# Patient Record
Sex: Male | Born: 1955 | Race: White | Hispanic: No | Marital: Married | Smoking: Never smoker
Health system: Southern US, Community
[De-identification: ages and names within clinical notes are randomized; demographics above are authoritative.]

## PROBLEM LIST (undated history)

## (undated) DIAGNOSIS — I491 Atrial premature depolarization: Secondary | ICD-10-CM

## (undated) DIAGNOSIS — D751 Secondary polycythemia: Secondary | ICD-10-CM

## (undated) DIAGNOSIS — K635 Polyp of colon: Secondary | ICD-10-CM

## (undated) DIAGNOSIS — G709 Myoneural disorder, unspecified: Secondary | ICD-10-CM

## (undated) DIAGNOSIS — C060 Malignant neoplasm of cheek mucosa: Secondary | ICD-10-CM

## (undated) DIAGNOSIS — L719 Rosacea, unspecified: Secondary | ICD-10-CM

## (undated) DIAGNOSIS — R030 Elevated blood-pressure reading, without diagnosis of hypertension: Secondary | ICD-10-CM

## (undated) DIAGNOSIS — R011 Cardiac murmur, unspecified: Secondary | ICD-10-CM

## (undated) DIAGNOSIS — E66811 Obesity, class 1: Secondary | ICD-10-CM

## (undated) DIAGNOSIS — E119 Type 2 diabetes mellitus without complications: Secondary | ICD-10-CM

## (undated) DIAGNOSIS — N400 Enlarged prostate without lower urinary tract symptoms: Secondary | ICD-10-CM

## (undated) DIAGNOSIS — J302 Other seasonal allergic rhinitis: Secondary | ICD-10-CM

## (undated) DIAGNOSIS — K219 Gastro-esophageal reflux disease without esophagitis: Secondary | ICD-10-CM

## (undated) DIAGNOSIS — M109 Gout, unspecified: Secondary | ICD-10-CM

## (undated) DIAGNOSIS — G4733 Obstructive sleep apnea (adult) (pediatric): Secondary | ICD-10-CM

## (undated) DIAGNOSIS — E785 Hyperlipidemia, unspecified: Secondary | ICD-10-CM

## (undated) DIAGNOSIS — F988 Other specified behavioral and emotional disorders with onset usually occurring in childhood and adolescence: Secondary | ICD-10-CM

## (undated) DIAGNOSIS — R7303 Prediabetes: Secondary | ICD-10-CM

## (undated) DIAGNOSIS — N200 Calculus of kidney: Secondary | ICD-10-CM

## (undated) DIAGNOSIS — G4730 Sleep apnea, unspecified: Secondary | ICD-10-CM

## (undated) DIAGNOSIS — Z8701 Personal history of pneumonia (recurrent): Secondary | ICD-10-CM

## (undated) DIAGNOSIS — Z87442 Personal history of urinary calculi: Secondary | ICD-10-CM

## (undated) DIAGNOSIS — T7840XA Allergy, unspecified, initial encounter: Secondary | ICD-10-CM

## (undated) DIAGNOSIS — N529 Male erectile dysfunction, unspecified: Secondary | ICD-10-CM

## (undated) DIAGNOSIS — E669 Obesity, unspecified: Secondary | ICD-10-CM

## (undated) DIAGNOSIS — N486 Induration penis plastica: Secondary | ICD-10-CM

## (undated) HISTORY — DX: Rosacea, unspecified: L71.9

## (undated) HISTORY — DX: Elevated blood-pressure reading, without diagnosis of hypertension: R03.0

## (undated) HISTORY — DX: Obstructive sleep apnea (adult) (pediatric): G47.33

## (undated) HISTORY — DX: Prediabetes: R73.03

## (undated) HISTORY — PX: TONSILLECTOMY: SUR1361

## (undated) HISTORY — PX: OTHER SURGICAL HISTORY: SHX169

## (undated) HISTORY — DX: Calculus of kidney: N20.0

## (undated) HISTORY — DX: Secondary polycythemia: D75.1

## (undated) HISTORY — DX: Sleep apnea, unspecified: G47.30

## (undated) HISTORY — DX: Personal history of urinary calculi: Z87.442

## (undated) HISTORY — DX: Polyp of colon: K63.5

## (undated) HISTORY — DX: Allergy, unspecified, initial encounter: T78.40XA

## (undated) HISTORY — PX: BASAL CELL CARCINOMA EXCISION: SHX1214

## (undated) HISTORY — DX: Personal history of pneumonia (recurrent): Z87.01

## (undated) HISTORY — DX: Gastro-esophageal reflux disease without esophagitis: K21.9

## (undated) HISTORY — PX: COSMETIC SURGERY: SHX468

## (undated) HISTORY — DX: Malignant neoplasm of cheek mucosa: C06.0

## (undated) HISTORY — DX: Other seasonal allergic rhinitis: J30.2

## (undated) HISTORY — DX: Obesity, unspecified: E66.9

## (undated) HISTORY — DX: Obesity, class 1: E66.811

## (undated) HISTORY — DX: Induration penis plastica: N48.6

## (undated) HISTORY — PX: COLON SURGERY: SHX602

## (undated) HISTORY — DX: Hyperlipidemia, unspecified: E78.5

## (undated) HISTORY — DX: Benign prostatic hyperplasia without lower urinary tract symptoms: N40.0

## (undated) HISTORY — DX: Male erectile dysfunction, unspecified: N52.9

## (undated) HISTORY — DX: Cardiac murmur, unspecified: R01.1

## (undated) HISTORY — DX: Atrial premature depolarization: I49.1

## (undated) HISTORY — DX: Other specified behavioral and emotional disorders with onset usually occurring in childhood and adolescence: F98.8

## (undated) HISTORY — DX: Myoneural disorder, unspecified: G70.9

---

## 2007-01-15 DIAGNOSIS — Z8701 Personal history of pneumonia (recurrent): Secondary | ICD-10-CM

## 2007-01-15 HISTORY — PX: COLONOSCOPY: SHX174

## 2007-01-15 HISTORY — DX: Personal history of pneumonia (recurrent): Z87.01

## 2008-01-15 DIAGNOSIS — G4733 Obstructive sleep apnea (adult) (pediatric): Secondary | ICD-10-CM

## 2008-01-15 HISTORY — DX: Obstructive sleep apnea (adult) (pediatric): G47.33

## 2009-01-14 DIAGNOSIS — I491 Atrial premature depolarization: Secondary | ICD-10-CM

## 2009-01-14 DIAGNOSIS — F988 Other specified behavioral and emotional disorders with onset usually occurring in childhood and adolescence: Secondary | ICD-10-CM

## 2009-01-14 HISTORY — DX: Other specified behavioral and emotional disorders with onset usually occurring in childhood and adolescence: F98.8

## 2009-01-14 HISTORY — DX: Atrial premature depolarization: I49.1

## 2012-03-14 HISTORY — PX: COLONOSCOPY: SHX174

## 2013-01-14 DIAGNOSIS — C060 Malignant neoplasm of cheek mucosa: Secondary | ICD-10-CM

## 2013-01-14 HISTORY — DX: Malignant neoplasm of cheek mucosa: C06.0

## 2013-01-14 HISTORY — PX: MOHS SURGERY: SUR867

## 2013-05-04 LAB — COMPREHENSIVE METABOLIC PANEL
ALT: 66
AST: 44 U/L
Albumin: 4.2
Alkaline Phosphatase: 83 U/L
CREATININE: 1.1
Calcium: 9.5 mg/dL
GLUCOSE: 99
POTASSIUM: 4.5 mmol/L
SODIUM: 137
Total Bilirubin: 0.6 mg/dL

## 2013-05-04 LAB — LIPID PANEL
Cholesterol: 187
HDL: 39
LDL CALC: 123
Triglycerides: 126

## 2013-05-04 LAB — TSH: TSH: 1.32

## 2013-05-04 LAB — PSA: PSA: 1.01

## 2013-05-04 LAB — URIC ACID: Uric Acid: 5.7

## 2013-05-04 LAB — HEMOGLOBIN A1C: A1c: 5.8

## 2013-10-15 LAB — CBC
HGB: 17.3 g/dL
WBC: 8.7
platelet count: 262

## 2014-08-17 ENCOUNTER — Emergency Department
Admission: EM | Admit: 2014-08-17 | Discharge: 2014-08-17 | Disposition: A | Discharge status: Home / Self Care | Payer: Federal, State, Local not specified - PPO | Attending: Emergency Medicine | Admitting: Emergency Medicine

## 2014-08-17 ENCOUNTER — Encounter: Payer: Self-pay | Admitting: Medical Oncology

## 2014-08-17 DIAGNOSIS — Z720 Tobacco use: Secondary | ICD-10-CM | POA: Diagnosis not present

## 2014-08-17 DIAGNOSIS — Z203 Contact with and (suspected) exposure to rabies: Secondary | ICD-10-CM | POA: Diagnosis present

## 2014-08-17 DIAGNOSIS — E119 Type 2 diabetes mellitus without complications: Secondary | ICD-10-CM | POA: Insufficient documentation

## 2014-08-17 HISTORY — DX: Gout, unspecified: M10.9

## 2014-08-17 HISTORY — DX: Type 2 diabetes mellitus without complications: E11.9

## 2014-08-17 MED ORDER — RABIES IMMUNE GLOBULIN 150 UNIT/ML IM INJ
20.0000 [IU]/kg | INJECTION | Freq: Once | INTRAMUSCULAR | Status: AC
  Administered 2014-08-17: 2100 [IU] via INTRAMUSCULAR
  Filled 2014-08-17: qty 14

## 2014-08-17 MED ORDER — RABIES VACCINE, PCEC IM SUSR
1.0000 mL | Freq: Once | INTRAMUSCULAR | Status: AC
  Administered 2014-08-17: 1 mL via INTRAMUSCULAR
  Filled 2014-08-17: qty 1

## 2014-08-17 NOTE — Discharge Instructions (Signed)
Rabies  Rabies is a viral infection that can be spread to people from infected animals. The infection affects the brain and central nervous system. Once the disease develops, it almost always causes death. Because of this, when a person is bitten by an animal that may have rabies, treatment to prevent rabies often needs to be started whether or not the animal is known to be infected. Prompt treatment with the rabies vaccine and rabies immune globulin is very effective at preventing the infection from developing in people who have been exposed to the rabies virus. CAUSES  Rabies is caused by a virus that lives inside some animals. When a person is bitten by an infected animal, the rabies virus is spread to the person through the infected spit (saliva) of the animal. This virus can be carried by animals such as dogs, cats, skunks, bats, woodchucks, raccoons, coyotes, and foxes. SYMPTOMS  By the time symptoms appear, rabies is usually fatal for the person. Common symptoms include:  Headache.  Fever.  Fatigue and weakness.  Agitation.  Anxiety.  Confusion.  Unusual behavior, such as hyperactivity, fear of water (hydrophobia), or fear of air (aerophobia).  Hallucinations.  Insomnia.  Weakness in the arms or legs.  Difficulty swallowing. Most people get sick in 1-3 months after being bitten. This often varies and may depend on the location of the bite. The infection will take less time to develop if the bite occurred closer to the head.  DIAGNOSIS  To determine if a person is infected, several tests must be performed, such as:  A skin biopsy.  A saliva test.  A lumbar puncture to remove spinal fluid so it can be examined.  Blood tests. TREATMENT  Treatment to prevent the infection from developing (post-exposure prophylaxis, PEP) is often started before knowing for sure if the person has been exposed to the rabies virus. PEP involves cleaning the wound, giving an antibody injection  (rabies immune globulin), and giving a series of rabies vaccine injections. The series of injections are usually given over a two-week period. If possible, the animal that bit the person will be observed to see if it remains healthy. If the animal has been killed, it can be sent to a state laboratory and examined to see if the animal had rabies. If a person is bitten by a domestic animal (dog, cat, or ferret) that appears healthy and can be observed to see if it remains healthy, often no further treatment is necessary other than care of the wounds caused by the animal. Rabies is often a fatal illness once the infection develops in a person. Although a few people who developed rabies have survived after experimental treatment with certain drugs, all these survivors still had severe nervous system problems after the treatment. This is why caregivers use extra caution and begin PEP treatment for people who have been bitten by animals that are possibly infected with rabies.  HOME CARE INSTRUCTIONS  If you were bitten by an unknown animal, make sure you know your caregiver's instructions for follow-up. If the animal was sent to a laboratory for examination, ask when the test results will be ready. Make sure you get the test results.  Take these steps to care for your wound:  Keep the wound clean, dry, and dressed as directed by your caregiver.  Keep the injured part elevated as much as possible.  Do not resume use of the affected area until directed.  Only take over-the-counter or prescription medicines as directed by  your caregiver.  Keep all follow-up appointments as directed by your caregiver. PREVENTION  To prevent rabies, people need to reduce their risk of having contact with infected animals.   Make sure your pets (dogs, cats, ferrets) are vaccinated against rabies. Keep these vaccinations up-to-date as directed by your veterinarian.  Supervise your pets when they are outside. Keep them away  from wild animals.  Call your local animal control services to report any stray animals. These animals may not be vaccinated.  Stay away from stray or wild animals.  Consider getting the rabies vaccine (preexposure) if you are traveling to an area where rabies is common or if your job or activities involve possible contact with wild or stray animals. Discuss this with your caregiver. Document Released: 12/31/2004 Document Revised: 09/25/2011 Document Reviewed: 07/30/2011 [facility] Patient Information 2015 [place], [place]. This information is not intended to replace advice given to you by your health care provider. Make sure you discuss any questions you have with your health care provider.  Rabies Vaccine What You Need to Know WHAT IS RABIES?  Rabies is a serious disease. It is caused by a virus.  Rabies is mainly a disease of animals. Humans get rabies when they are bitten by infected animals.  At first there might not be any symptoms. But weeks, or even years after a bite, rabies can cause pain, fatigue, headaches, fever, and irritability. These are followed by seizures, hallucinations, and paralysis. Human rabies is almost always fatal.  Wild animals, especially bats, are the most common source of human rabies infection in the [place]. Skunks, raccoons, dogs, cats, coyotes, foxes, and other mammals can also transmit the disease.  Human rabies is rare in the [place]. There have been only 41 cases diagnosed since 1990. However, between 16,000 and 39,000 people are vaccinated each year as a precaution after animal bites. Also, rabies is far more common in other parts of the world, with about 40,000 to 70,000 rabies-related deaths worldwide each year. Bites from unvaccinated dogs cause most of these cases. Rabies vaccine can prevent rabies. RABIES VACCINE  Rabies vaccine is given to people at high risk of rabies to protect them if they are exposed. It can also prevent the disease  if it is given to a person after they have been exposed.  Rabies vaccine is made from killed rabies virus. It cannot cause rabies. WHO SHOULD GET RABIES VACCINE AND WHEN? Preventive Vaccination (No Exposure)  People at high risk of exposure to rabies, such as veterinarians, Insurance account manager, rabies laboratory workers, spelunkers, and rabies biologics production workers should be offered rabies vaccine.  The vaccine should also be considered for:  People whose activities bring them into frequent contact with rabies virus or with possibly rabid animals.  International travelers who are likely to come in contact with animals in parts of the world where rabies is common.  The pre-exposure schedule for rabies vaccination is 3 doses, given at the following times:  Dose 1: As appropriate.  Dose 2: 7 days after Dose 1.  Dose 3: 21 days or 28 days after Dose 1.  For laboratory workers and others who may be repeatedly exposed to rabies virus, periodic testing for immunity is recommended and booster doses should be given as needed. (Testing or booster doses are not recommended for travelers). Ask your doctor for details. Vaccination After an Exposure Anyone who has been bitten by an animal, or who otherwise may have been exposed to rabies, should clean the wound  and see a doctor immediately. The doctor will determine if they need to be vaccinated. A person who is exposed and has never been vaccinated against rabies should get 4 doses of rabies vaccine: one dose right away and additional doses on the 3rd, 7th, and 14th days. They should also get another shot called Rabies Immune Globulin at the same time as the first dose.  A person who has been previously vaccinated should get 2 doses of rabies vaccine: one right away and another on the 3rd day. Rabies Immune Globulin is not needed. TELL YOUR DOCTOR IF: Talk with a doctor before getting rabies vaccine if you:  Ever had a serious (life-threatening)  allergic reaction to a previous dose of rabies vaccine or to any component of the vaccine; tell your doctor if you have any severe allergies.  Have a weakened immune system because of:  HIV, AIDS, or another disease that affects the immune system.  Treatment with drugs that affect the immune system, such as steroids.  Cancer or cancer treatment with radiation or drugs. If you have a minor illness, such as a cold, you can be vaccinated. If you are moderately or severely ill, you should probably wait until you recover before getting a routine (non-exposure) dose of rabies vaccine. If you have been exposed to rabies virus, you should get the vaccine regardless of any other illnesses you may have. WHAT ARE THE RISKS FROM RABIES VACCINE? A vaccine, like any medicine, is capable of causing serious problems, such as severe allergic reactions. The risk of a vaccine causing serious harm, or death, is extremely small. Serious problems from rabies vaccine are very rare.  Mild problems:  Soreness, redness, swelling, or itching where the shot was given (30% to 74%).  Headache, nausea, abdominal pain, muscle aches, or dizziness (5% to 40%). Moderate problems:  Hives, pain in the joints, or fever (about 6% of booster doses).  Other nervous system disorders, such as Guillain-Barr Syndrome (GBS), have been reported after rabies vaccine, but this happens so rarely that it is not known whether they are related to the vaccine. Note: Several brands of rabies vaccine are available in the [place], and reactions may vary between brands. Your provider can give you more information about a particular brand. WHAT IF THERE IS A SERIOUS REACTION? What should I look for? Look for anything that concerns you, such as signs of a severe allergic reaction, very high fever, or behavior changes.  Signs of a severe allergic reaction can include hives, swelling of the face and throat, difficulty breathing, a fast  heartbeat, dizziness, and weakness. These would start a few minutes to a few hours after the vaccination. What should I do?  If you think it is a severe allergic reaction or other emergency that cannot wait, call 911 or get the person to the nearest hospital. Otherwise, call your doctor.  Afterward, the reaction should be reported to the Vaccine Adverse Event Reporting System (VAERS). Your doctor might file this report, or you can do it yourself through the VAERS website at www.vaers.SamedayNews.es or by calling 334-001-6581. VAERS is only for reporting reactions. They do not give medical advice. HOW CAN I LEARN MORE?  Ask your doctor.  Call your local or state health department.  Contact the Centers for Disease Control and Prevention (CDC):  Visit the CDC rabies website at [place].[employer] CDC Rabies Vaccine VIS (10/20/07) Document Released: 10/28/2005 Document Revised: 12/18/2011 Document Reviewed: 04/22/2012 [facility] Patient Information 2015 [place]. This information  is not intended to replace advice given to you by your health care provider. Make sure you discuss any questions you have with your health care provider.

## 2014-08-17 NOTE — ED Notes (Signed)
Pt with wife to er with reports that they had a live bat in their bed last night.

## 2014-08-17 NOTE — ED Notes (Signed)

## 2014-08-17 NOTE — ED Provider Notes (Signed)
[facility] Emergency Department Provider Note ____________________________________________  Time seen: Approximately 7:32 PM  I have reviewed the triage vital signs and the nursing notes.   HISTORY  Chief Complaint Rabies Injection   HPI Richard Case is a 59 y.o. male who presents to the emergency department to start the rabies series. He states that there was a live bat under the covers while they were sleeping last night. He denies being bitten   Past Medical History  Diagnosis Date  . Diabetes mellitus without complication   . Gout     There are no active problems to display for this patient.   History reviewed. No pertinent past surgical history.  No current outpatient prescriptions on file.  Allergies Review of patient's allergies indicates no known allergies.  No family history on file.  Social History History  Substance Use Topics  . Smoking status: Never Smoker   . Smokeless tobacco: Not on file  . Alcohol Use: No    Review of Systems Constitutional: No fever/chills Eyes: No visual changes. ENT: No sore throat. Cardiovascular: Denies chest pain. Respiratory: Denies shortness of breath. Gastrointestinal: No abdominal pain.  No nausea, no vomiting.  No diarrhea.  No constipation. Genitourinary: Negative for dysuria. Musculoskeletal: Negative for back pain. Skin: Negative for rash. Neurological: Negative for headaches, focal weakness or numbness.  10-point ROS otherwise negative.  ____________________________________________   PHYSICAL EXAM:  VITAL SIGNS: ED Triage Vitals  Enc Vitals Group     BP 08/17/14 1728 128/71 mmHg     Pulse Rate 08/17/14 1728 82     Resp 08/17/14 1728 18     Temp 08/17/14 1728 97.6 F (36.4 C)     Temp Source 08/17/14 1728 Oral     SpO2 08/17/14 1728 96 %     Weight 08/17/14 1728 234 lb (106.142 kg)     Height 08/17/14 1728 5\' 11"  (1.803 m)     Head Cir --      Peak Flow --      Pain  Score --      Pain Loc --      Pain Edu? --      Excl. in [place]? --     Constitutional: Alert and oriented. Well appearing and in no acute distress. Eyes: Conjunctivae are normal. PERRL. EOMI. Head: Atraumatic. Nose: No congestion/rhinnorhea. Mouth/Throat: Mucous membranes are moist.  Oropharynx non-erythematous. Neck: No stridor.   Cardiovascular: Normal rate, regular rhythm.   Good peripheral circulation. Respiratory: Normal respiratory effort.  No retractions. Gastrointestinal: Soft and nontender. No distention.  Musculoskeletal: No lower extremity tenderness nor edema.  No joint effusions. Neurologic:  Normal speech and language. No gross focal neurologic deficits are appreciated. No gait instability. Skin:  Skin is warm, dry and intact. No rash noted. Psychiatric: Mood and affect are normal. Speech and behavior are normal.  ____________________________________________   LABS (all labs ordered are listed, but only abnormal results are displayed)  Labs Reviewed - No data to display ____________________________________________  EKG   ____________________________________________  RADIOLOGY   ____________________________________________   PROCEDURES  Procedure(s) performed: None  Critical Care performed: No  ____________________________________________   INITIAL IMPRESSION / ASSESSMENT AND PLAN / ED COURSE  Pertinent labs & imaging results that were available during my care of the patient were reviewed by me and considered in my medical decision making (see chart for details).  Patient was advised to return for days for the second injection. He was advised to return sooner for symptoms of  concern. ____________________________________________   FINAL CLINICAL IMPRESSION(S) / ED DIAGNOSES  Final diagnoses:  Contact with and suspected exposure to rabies      Victorino Dike, FNP 08/17/14 1936  Carrie Mew, MD 08/17/14 [phone]

## 2014-08-20 ENCOUNTER — Encounter: Payer: Self-pay | Admitting: Emergency Medicine

## 2014-08-20 ENCOUNTER — Emergency Department
Admission: EM | Admit: 2014-08-20 | Discharge: 2014-08-20 | Disposition: A | Discharge status: Home / Self Care | Payer: Federal, State, Local not specified - PPO | Attending: Emergency Medicine | Admitting: Emergency Medicine

## 2014-08-20 DIAGNOSIS — E119 Type 2 diabetes mellitus without complications: Secondary | ICD-10-CM | POA: Diagnosis not present

## 2014-08-20 DIAGNOSIS — Z23 Encounter for immunization: Secondary | ICD-10-CM | POA: Insufficient documentation

## 2014-08-20 MED ORDER — RABIES VACCINE, PCEC IM SUSR
1.0000 mL | Freq: Once | INTRAMUSCULAR | Status: AC
  Administered 2014-08-20: 1 mL via INTRAMUSCULAR
  Filled 2014-08-20: qty 1

## 2014-08-20 NOTE — ED Notes (Signed)
2nd set of rabies shot

## 2014-08-20 NOTE — ED Notes (Signed)
Patient here for additional rabies injection.

## 2014-08-20 NOTE — ED Provider Notes (Signed)
[facility] Emergency Department Provider Note ____________________________________________  Time seen: 1920  I have reviewed the triage vital signs and the nursing notes.  HISTORY  Chief Complaint  Rabies Injection  HPI Richard Case is a 59 y.o. male returns to the ED for rabies for vaccine. He denies any complaints in the interim.   Past Medical History  Diagnosis Date  . Diabetes mellitus without complication   . Gout    There are no active problems to display for this patient.  History reviewed. No pertinent past surgical history.  No current outpatient prescriptions on file.  Allergies Review of patient's allergies indicates no known allergies.  No family history on file.  Social History History  Substance Use Topics  . Smoking status: Never Smoker   . Smokeless tobacco: Not on file  . Alcohol Use: No   Review of Systems  Constitutional: Negative for fever. Eyes: Negative for visual changes. ENT: Negative for sore throat. Cardiovascular: Negative for chest pain. Respiratory: Negative for shortness of breath. Gastrointestinal: Negative for abdominal pain, vomiting and diarrhea. Genitourinary: Negative for dysuria. Musculoskeletal: Negative for back pain. Skin: Negative for rash. Neurological: Negative for headaches, focal weakness or numbness. ____________________________________________  PHYSICAL EXAM:  VITAL SIGNS: ED Triage Vitals  Enc Vitals Group     BP 08/20/14 1859 136/73 mmHg     Pulse Rate 08/20/14 1859 85     Resp 08/20/14 1859 18     Temp 08/20/14 1859 98.4 F (36.9 C)     Temp Source 08/20/14 1859 Oral     SpO2 08/20/14 1859 95 %     Weight 08/20/14 1859 236 lb (107.049 kg)     Height 08/20/14 1859 5\' 11"  (1.803 m)     Head Cir --      Peak Flow --      Pain Score 08/20/14 1902 0     Pain Loc --      Pain Edu? --      Excl. in [place]? --    Constitutional: Alert and oriented. Well appearing and in no  distress. Eyes: Conjunctivae are normal. PERRL. Normal extraocular movements. ENT   Head: Normocephalic and atraumatic.   Nose: No congestion/rhinnorhea.   Mouth/Throat: Mucous membranes are moist.   Neck: Supple. No thyromegaly. Hematological/Lymphatic/Immunilogical: No cervical lymphadenopathy. Cardiovascular: Normal rate, regular rhythm.  Respiratory: Normal respiratory effort. No wheezes/rales/rhonchi. Gastrointestinal: Soft and nontender. No distention. Musculoskeletal: Nontender with normal range of motion in all extremities.  Neurologic:  Normal gait without ataxia. Normal speech and language. No gross focal neurologic deficits are appreciated. Skin:  Skin is warm, dry and intact. No rash noted. Psychiatric: Mood and affect are normal. Patient exhibits appropriate insight and judgment. ____________________________________________  PROCEDURES  Rabavert 1 mg IM  ____________________________________________  INITIAL IMPRESSION / ASSESSMENT AND PLAN / ED COURSE  Rabies vaccine #2 of 5 for exposure prophylaxis. Return to the ED as scheduled.  ____________________________________________  FINAL CLINICAL IMPRESSION(S) / ED DIAGNOSES  Final diagnoses:  Need for rabies vaccination     Melvenia Needles, PA-C 08/20/14 1939

## 2014-08-20 NOTE — Discharge Instructions (Signed)
Rabies Vaccine What You Need to Know WHAT IS RABIES?  Rabies is a serious disease. It is caused by a virus.  Rabies is mainly a disease of animals. Humans get rabies when they are bitten by infected animals.  At first there might not be any symptoms. But weeks, or even years after a bite, rabies can cause pain, fatigue, headaches, fever, and irritability. These are followed by seizures, hallucinations, and paralysis. Human rabies is almost always fatal.  Wild animals, especially bats, are the most common source of human rabies infection in the [place]. Skunks, raccoons, dogs, cats, coyotes, foxes, and other mammals can also transmit the disease.  Human rabies is rare in the [place]. There have been only 43 cases diagnosed since 1990. However, between 16,000 and 39,000 people are vaccinated each year as a precaution after animal bites. Also, rabies is far more common in other parts of the world, with about 40,000 to 70,000 rabies-related deaths worldwide each year. Bites from unvaccinated dogs cause most of these cases. Rabies vaccine can prevent rabies. RABIES VACCINE  Rabies vaccine is given to people at high risk of rabies to protect them if they are exposed. It can also prevent the disease if it is given to a person after they have been exposed.  Rabies vaccine is made from killed rabies virus. It cannot cause rabies. WHO SHOULD GET RABIES VACCINE AND WHEN? Preventive Vaccination (No Exposure)  People at high risk of exposure to rabies, such as veterinarians, Insurance account manager, rabies laboratory workers, spelunkers, and rabies biologics production workers should be offered rabies vaccine.  The vaccine should also be considered for:  People whose activities bring them into frequent contact with rabies virus or with possibly rabid animals.  International travelers who are likely to come in contact with animals in parts of the world where rabies is common.  The pre-exposure  schedule for rabies vaccination is 3 doses, given at the following times:  Dose 1: As appropriate.  Dose 2: 7 days after Dose 1.  Dose 3: 21 days or 28 days after Dose 1.  For laboratory workers and others who may be repeatedly exposed to rabies virus, periodic testing for immunity is recommended and booster doses should be given as needed. (Testing or booster doses are not recommended for travelers). Ask your doctor for details. Vaccination After an Exposure Anyone who has been bitten by an animal, or who otherwise may have been exposed to rabies, should clean the wound and see a doctor immediately. The doctor will determine if they need to be vaccinated. A person who is exposed and has never been vaccinated against rabies should get 4 doses of rabies vaccine: one dose right away and additional doses on the 3rd, 7th, and 14th days. They should also get another shot called Rabies Immune Globulin at the same time as the first dose.  A person who has been previously vaccinated should get 2 doses of rabies vaccine: one right away and another on the 3rd day. Rabies Immune Globulin is not needed. TELL YOUR DOCTOR IF: Talk with a doctor before getting rabies vaccine if you:  Ever had a serious (life-threatening) allergic reaction to a previous dose of rabies vaccine or to any component of the vaccine; tell your doctor if you have any severe allergies.  Have a weakened immune system because of:  HIV, AIDS, or another disease that affects the immune system.  Treatment with drugs that affect the immune system, such as steroids.  Cancer  or cancer treatment with radiation or drugs. If you have a minor illness, such as a cold, you can be vaccinated. If you are moderately or severely ill, you should probably wait until you recover before getting a routine (non-exposure) dose of rabies vaccine. If you have been exposed to rabies virus, you should get the vaccine regardless of any other illnesses you may  have. WHAT ARE THE RISKS FROM RABIES VACCINE? A vaccine, like any medicine, is capable of causing serious problems, such as severe allergic reactions. The risk of a vaccine causing serious harm, or death, is extremely small. Serious problems from rabies vaccine are very rare.  Mild problems:  Soreness, redness, swelling, or itching where the shot was given (30% to 74%).  Headache, nausea, abdominal pain, muscle aches, or dizziness (5% to 40%). Moderate problems:  Hives, pain in the joints, or fever (about 6% of booster doses).  Other nervous system disorders, such as Guillain-Barr Syndrome (GBS), have been reported after rabies vaccine, but this happens so rarely that it is not known whether they are related to the vaccine. Note: Several brands of rabies vaccine are available in the [place], and reactions may vary between brands. Your provider can give you more information about a particular brand. WHAT IF THERE IS A SERIOUS REACTION? What should I look for? Look for anything that concerns you, such as signs of a severe allergic reaction, very high fever, or behavior changes.  Signs of a severe allergic reaction can include hives, swelling of the face and throat, difficulty breathing, a fast heartbeat, dizziness, and weakness. These would start a few minutes to a few hours after the vaccination. What should I do?  If you think it is a severe allergic reaction or other emergency that cannot wait, call 911 or get the person to the nearest hospital. Otherwise, call your doctor.  Afterward, the reaction should be reported to the Vaccine Adverse Event Reporting System (VAERS). Your doctor might file this report, or you can do it yourself through the VAERS website at www.vaers.SamedayNews.es or by calling 2155926151. VAERS is only for reporting reactions. They do not give medical advice. HOW CAN I LEARN MORE?  Ask your doctor.  Call your local or state health department.  Contact the  Centers for Disease Control and Prevention (CDC):  Visit the CDC rabies website at [place].no CDC Rabies Vaccine VIS (10/20/07) Document Released: 10/28/2005 Document Revised: 12/18/2011 Document Reviewed: 04/22/2012 [facility] Patient Information 2015 [place]. This information is not intended to replace advice given to you by your health care provider. Make sure you discuss any questions you have with your health care provider.  Return to the ED as scheduled for rabies vaccine.

## 2014-08-24 ENCOUNTER — Emergency Department
Admission: EM | Admit: 2014-08-24 | Discharge: 2014-08-24 | Disposition: A | Discharge status: Home / Self Care | Payer: Federal, State, Local not specified - PPO | Attending: Emergency Medicine | Admitting: Emergency Medicine

## 2014-08-24 DIAGNOSIS — Z203 Contact with and (suspected) exposure to rabies: Secondary | ICD-10-CM

## 2014-08-24 DIAGNOSIS — Z23 Encounter for immunization: Secondary | ICD-10-CM | POA: Insufficient documentation

## 2014-08-24 DIAGNOSIS — E119 Type 2 diabetes mellitus without complications: Secondary | ICD-10-CM | POA: Diagnosis not present

## 2014-08-24 MED ORDER — RABIES VACCINE, PCEC IM SUSR
1.0000 mL | Freq: Once | INTRAMUSCULAR | Status: AC
  Administered 2014-08-24: 1 mL via INTRAMUSCULAR
  Filled 2014-08-24: qty 1

## 2014-08-24 NOTE — ED Provider Notes (Signed)
[facility] Emergency Department Provider Note  ____________________________________________  Time seen: Approximately 10:09 PM  I have reviewed the triage vital signs and the nursing notes.   HISTORY  Chief Complaint Rabies Injection    HPI Richard Case is a 59 y.o. male who returns to the ED for rabies vaccine. Denies any complaints at this time.   Past Medical History  Diagnosis Date  . Diabetes mellitus without complication   . Gout     There are no active problems to display for this patient.   No past surgical history on file.  No current outpatient prescriptions on file.  Allergies Review of patient's allergies indicates no known allergies.  No family history on file.  Social History Social History  Substance Use Topics  . Smoking status: Never Smoker   . Smokeless tobacco: Not on file  . Alcohol Use: No    Review of Systems Constitutional: No fever/chills Eyes: No visual changes. ENT: No sore throat. Cardiovascular: Denies chest pain. Respiratory: Denies shortness of breath. Gastrointestinal: No abdominal pain.  No nausea, no vomiting.  No diarrhea.  No constipation. Genitourinary: Negative for dysuria. Musculoskeletal: Negative for back pain. Skin: Negative for rash. Neurological: Negative for headaches, focal weakness or numbness.  10-point ROS otherwise negative.  ____________________________________________   PHYSICAL EXAM:  VITAL SIGNS: ED Triage Vitals  Enc Vitals Group     BP 08/24/14 2137 119/60 mmHg     Pulse Rate 08/24/14 2136 78     Resp 08/24/14 2136 18     Temp 08/24/14 2136 97.5 F (36.4 C)     Temp Source 08/24/14 2136 Oral     SpO2 --      Weight 08/24/14 2136 230 lb (104.327 kg)     Height 08/24/14 2136 5\' 11"  (1.803 m)     Head Cir --      Peak Flow --      Pain Score 08/24/14 2136 1     Pain Loc --      Pain Edu? --      Excl. in [place]? --     Constitutional: Alert and oriented. Well  appearing and in no acute distress. Neurologic:  Normal speech and language. No gross focal neurologic deficits are appreciated. No gait instability. Skin:  Skin is warm, dry and intact. No rash noted. Psychiatric: Mood and affect are normal. Speech and behavior are normal.  ____________________________________________   LABS (all labs ordered are listed, but only abnormal results are displayed)  Labs Reviewed - No data to display ____________________________________________   PROCEDURES  Procedure(s) performed: None  Critical Care performed: No  ____________________________________________   INITIAL IMPRESSION / ASSESSMENT AND PLAN / ED COURSE  Pertinent labs & imaging results that were available during my care of the patient were reviewed by me and considered in my medical decision making (see chart for details).  Post rabies exposure. Patient here for follow-up injection as directed. They're to return as scheduled. Denies any complaints at this time. ____________________________________________   FINAL CLINICAL IMPRESSION(S) / ED DIAGNOSES  Final diagnoses:  Need for post exposure prophylaxis for rabies      Arlyss Repress, PA-C 08/24/14 [place], MD 09/04/14 [phone]

## 2014-08-24 NOTE — Discharge Instructions (Signed)
Rabies  Rabies is a viral infection that can be spread to people from infected animals. The infection affects the brain and central nervous system. Once the disease develops, it almost always causes death. Because of this, when a person is bitten by an animal that may have rabies, treatment to prevent rabies often needs to be started whether or not the animal is known to be infected. Prompt treatment with the rabies vaccine and rabies immune globulin is very effective at preventing the infection from developing in people who have been exposed to the rabies virus. CAUSES  Rabies is caused by a virus that lives inside some animals. When a person is bitten by an infected animal, the rabies virus is spread to the person through the infected spit (saliva) of the animal. This virus can be carried by animals such as dogs, cats, skunks, bats, woodchucks, raccoons, coyotes, and foxes. SYMPTOMS  By the time symptoms appear, rabies is usually fatal for the person. Common symptoms include:  Headache.  Fever.  Fatigue and weakness.  Agitation.  Anxiety.  Confusion.  Unusual behavior, such as hyperactivity, fear of water (hydrophobia), or fear of air (aerophobia).  Hallucinations.  Insomnia.  Weakness in the arms or legs.  Difficulty swallowing. Most people get sick in 1-3 months after being bitten. This often varies and may depend on the location of the bite. The infection will take less time to develop if the bite occurred closer to the head.  DIAGNOSIS  To determine if a person is infected, several tests must be performed, such as:  A skin biopsy.  A saliva test.  A lumbar puncture to remove spinal fluid so it can be examined.  Blood tests. TREATMENT  Treatment to prevent the infection from developing (post-exposure prophylaxis, PEP) is often started before knowing for sure if the person has been exposed to the rabies virus. PEP involves cleaning the wound, giving an antibody injection  (rabies immune globulin), and giving a series of rabies vaccine injections. The series of injections are usually given over a two-week period. If possible, the animal that bit the person will be observed to see if it remains healthy. If the animal has been killed, it can be sent to a state laboratory and examined to see if the animal had rabies. If a person is bitten by a domestic animal (dog, cat, or ferret) that appears healthy and can be observed to see if it remains healthy, often no further treatment is necessary other than care of the wounds caused by the animal. Rabies is often a fatal illness once the infection develops in a person. Although a few people who developed rabies have survived after experimental treatment with certain drugs, all these survivors still had severe nervous system problems after the treatment. This is why caregivers use extra caution and begin PEP treatment for people who have been bitten by animals that are possibly infected with rabies.  HOME CARE INSTRUCTIONS  If you were bitten by an unknown animal, make sure you know your caregiver's instructions for follow-up. If the animal was sent to a laboratory for examination, ask when the test results will be ready. Make sure you get the test results.  Take these steps to care for your wound:  Keep the wound clean, dry, and dressed as directed by your caregiver.  Keep the injured part elevated as much as possible.  Do not resume use of the affected area until directed.  Only take over-the-counter or prescription medicines as directed by   your caregiver.  Keep all follow-up appointments as directed by your caregiver. PREVENTION  To prevent rabies, people need to reduce their risk of having contact with infected animals.   Make sure your pets (dogs, cats, ferrets) are vaccinated against rabies. Keep these vaccinations up-to-date as directed by your veterinarian.  Supervise your pets when they are outside. Keep them away  from wild animals.  Call your local animal control services to report any stray animals. These animals may not be vaccinated.  Stay away from stray or wild animals.  Consider getting the rabies vaccine (preexposure) if you are traveling to an area where rabies is common or if your job or activities involve possible contact with wild or stray animals. Discuss this with your caregiver. Document Released: 12/31/2004 Document Revised: 09/25/2011 Document Reviewed: 07/30/2011 [employer] Patient Information 2015 [employer]. This information is not intended to replace advice given to you by your health care provider. Make sure you discuss any questions you have with your health care provider.  

## 2014-08-24 NOTE — ED Notes (Signed)
Here for rabies vaccine #3

## 2014-09-02 ENCOUNTER — Emergency Department
Admission: EM | Admit: 2014-09-02 | Discharge: 2014-09-02 | Disposition: A | Discharge status: Home / Self Care | Payer: Federal, State, Local not specified - PPO | Attending: Emergency Medicine | Admitting: Emergency Medicine

## 2014-09-02 ENCOUNTER — Encounter: Payer: Self-pay | Admitting: Emergency Medicine

## 2014-09-02 DIAGNOSIS — E119 Type 2 diabetes mellitus without complications: Secondary | ICD-10-CM | POA: Insufficient documentation

## 2014-09-02 DIAGNOSIS — Z23 Encounter for immunization: Secondary | ICD-10-CM | POA: Insufficient documentation

## 2014-09-02 MED ORDER — RABIES VACCINE, PCEC IM SUSR
1.0000 mL | Freq: Once | INTRAMUSCULAR | Status: AC
  Administered 2014-09-02: 1 mL via INTRAMUSCULAR
  Filled 2014-09-02: qty 1

## 2014-09-02 NOTE — ED Notes (Signed)
Rabies shot

## 2014-09-02 NOTE — Discharge Instructions (Signed)
Immunization Schedule, Adult  Influenza vaccine.  All adults should be immunized every year.  All adults, including pregnant women and people with hives-only allergy to eggs can receive the inactivated influenza (IIV) vaccine.  Adults aged 60-49 years can receive the recombinant influenza (RIV) vaccine. The RIV vaccine does not contain any egg protein.  Adults aged 65 years or older can receive the standard-dose IIV or the high-dose IIV.  Tetanus, diphtheria, and acellular pertussis (Td, Tdap) vaccine.  Pregnant women should receive 1 dose of Tdap vaccine during each pregnancy. The dose should be obtained regardless of the length of time since the last dose. Immunization is preferred during the 27th to 36th week of gestation.  An adult who has not previously received Tdap or who does not know his or her vaccine status should receive 1 dose of Tdap. This initial dose should be followed by tetanus and diphtheria toxoids (Td) booster doses every 10 years.  Adults with an unknown or incomplete history of completing a 3-dose immunization series with Td-containing vaccines should begin or complete a primary immunization series including a Tdap dose.  Adults should receive a Td booster every 10 years.  Varicella vaccine.  An adult without evidence of immunity to varicella should receive 2 doses or a second dose if he or she has previously received 1 dose.  Pregnant females who do not have evidence of immunity should receive the first dose after pregnancy. This first dose should be obtained before leaving the health care facility. The second dose should be obtained 4-8 weeks after the first dose.  Human papillomavirus (HPV) vaccine.  Females aged 13-26 years who have not received the vaccine previously should obtain the 3-dose series.  The vaccine is not recommended for use in pregnant females. However, pregnancy testing is not needed before receiving a dose. If a male is found to be  pregnant after receiving a dose, no treatment is needed. In that case, the remaining doses should be delayed until after the pregnancy.  Males aged 13-21 years who have not received the vaccine previously should receive the 3-dose series. Males aged 22-26 years may be immunized.  Immunization is recommended through the age of 26 years for any male who has sex with males and did not get any or all doses earlier.  Immunization is recommended for any person with an immunocompromised condition through the age of 26 years if he or she did not get any or all doses earlier.  During the 3-dose series, the second dose should be obtained 4-8 weeks after the first dose. The third dose should be obtained 24 weeks after the first dose and 16 weeks after the second dose.  Zoster vaccine.  One dose is recommended for adults aged 60 years or older unless certain conditions are present.  Measles, mumps, and rubella (MMR) vaccine.  Adults born before 1957 generally are considered immune to measles and mumps.  Adults born in 1957 or later should have 1 or more doses of MMR vaccine unless there is a contraindication to the vaccine or there is laboratory evidence of immunity to each of the three diseases.  A routine second dose of MMR vaccine should be obtained at least 28 days after the first dose for students attending postsecondary schools, health care workers, or international travelers.  People who received inactivated measles vaccine or an unknown type of measles vaccine during 1963-1967 should receive 2 doses of MMR vaccine.  People who received inactivated mumps vaccine or an unknown type   of mumps vaccine before 1979 and are at high risk for mumps infection should consider immunization with 2 doses of MMR vaccine.  For females of childbearing age, rubella immunity should be determined. If there is no evidence of immunity, females who are not pregnant should be vaccinated. If there is no evidence of  immunity, females who are pregnant should delay immunization until after pregnancy.  Unvaccinated health care workers born before 1957 who lack laboratory evidence of measles, mumps, or rubella immunity or laboratory confirmation of disease should consider measles and mumps immunization with 2 doses of MMR vaccine or rubella immunization with 1 dose of MMR vaccine.  Pneumococcal 13-valent conjugate (PCV13) vaccine.  When indicated, a person who is uncertain of his or her immunization history and has no record of immunization should receive the PCV13 vaccine.  An adult aged 19 years or older who has certain medical conditions and has not been previously immunized should receive 1 dose of PCV13 vaccine. This PCV13 should be followed with a dose of pneumococcal polysaccharide (PPSV23) vaccine. The PPSV23 vaccine dose should be obtained at least 8 weeks after the dose of PCV13 vaccine.  An adult aged 19 years or older who has certain medical conditions and previously received 1 or more doses of PPSV23 vaccine should receive 1 dose of PCV13. The PCV13 vaccine dose should be obtained 1 or more years after the last PPSV23 vaccine dose.  Pneumococcal polysaccharide (PPSV23) vaccine.  When PCV13 is also indicated, PCV13 should be obtained first.  All adults aged 65 years and older should be immunized.  An adult younger than age 65 years who has certain medical conditions should be immunized.  Any person who resides in a nursing home or long-term care facility should be immunized.  An adult smoker should be immunized.  People with an immunocompromised condition and certain other conditions should receive both PCV13 and PPSV23 vaccines.  People with human immunodeficiency virus (HIV) infection should be immunized as soon as possible after diagnosis.  Immunization during chemotherapy or radiation therapy should be avoided.  Routine use of PPSV23 vaccine is not recommended for American Indians,  [place]atives, or people younger than 65 years unless there are medical conditions that require PPSV23 vaccine.  When indicated, people who have unknown immunization and have no record of immunization should receive PPSV23 vaccine.  One-time revaccination 5 years after the first dose of PPSV23 is recommended for people aged 19-64 years who have chronic kidney failure, nephrotic syndrome, asplenia, or immunocompromised conditions.  People who received 1-2 doses of PPSV23 before age 65 years should receive another dose of PPSV23 vaccine at age 65 years or later if at least 5 years have passed since the previous dose.  Doses of PPSV23 are not needed for people immunized with PPSV23 at or after age 65 years.  Meningococcal vaccine.  Adults with asplenia or persistent complement component deficiencies should receive 2 doses of quadrivalent meningococcal conjugate (MenACWY-D) vaccine. The doses should be obtained at least 2 months apart.  Microbiologists working with certain meningococcal bacteria, military recruits, people at risk during an outbreak, and people who travel to or live in countries with a high rate of meningitis should be immunized.  A first-year college student up through age 21 years who is living in a residence hall should receive a dose if he or she did not receive a dose on or after his or her 16th birthday.  Adults who have certain high-risk conditions should receive one or more doses   of vaccine.  Hepatitis A vaccine.  Adults who wish to be protected from this disease, have certain high-risk conditions, work with hepatitis A-infected animals, work in hepatitis A research labs, or travel to or work in countries with a high rate of hepatitis A should be immunized.  Adults who were previously unvaccinated and who anticipate close contact with an international adoptee during the first 60 days after arrival in the [place] from a country with a high rate of hepatitis A should  be immunized.  Hepatitis B vaccine.  Adults who wish to be protected from this disease, have certain high-risk conditions, may be exposed to blood or other infectious body fluids, are household contacts or sex partners of hepatitis B positive people, are clients or workers in certain care facilities, or travel to or work in countries with a high rate of hepatitis B should be immunized.  Haemophilus influenzae type b (Hib) vaccine.  A previously unvaccinated person with asplenia or sickle cell disease or having a scheduled splenectomy should receive 1 dose of Hib vaccine.  Regardless of previous immunization, a recipient of a hematopoietic stem cell transplant should receive a 3-dose series 6-12 months after his or her successful transplant.  Hib vaccine is not recommended for adults with HIV infection. Document Released: 03/23/2003 Document Revised: 04/27/2012 Document Reviewed: 02/18/2012 [employer] Patient Information 2015 [employer]. This information is not intended to replace advice given to you by your health care provider. Make sure you discuss any questions you have with your health care provider.  

## 2014-09-02 NOTE — ED Provider Notes (Signed)
[facility] Emergency Department Provider Note ____________________________________________  Time seen: Approximately 9:01 AM  I have reviewed the triage vital signs and the nursing notes.   HISTORY  Chief Complaint Rabies Injection   HPI Richard Case is a 59 y.o. male is here for rabies vaccine.this should be #4 in his series. He denies any problems. Both he and his wife are here together for the same.   Past Medical History  Diagnosis Date  . Diabetes mellitus without complication   . Gout     There are no active problems to display for this patient.   History reviewed. No pertinent past surgical history.  No current outpatient prescriptions on file.  Allergies Review of patient's allergies indicates no known allergies.  No family history on file.  Social History Social History  Substance Use Topics  . Smoking status: Never Smoker   . Smokeless tobacco: None  . Alcohol Use: No    Review of Systems Constitutional: No fever/chills Skin: Negative for infection 10-point ROS otherwise negative.  ____________________________________________   PHYSICAL EXAM:  VITAL SIGNS: ED Triage Vitals  Enc Vitals Group     BP --      Pulse --      Resp --      Temp --      Temp src --      SpO2 --      Weight --      Height --      Head Cir --      Peak Flow --      Pain Score --      Pain Loc --      Pain Edu? --      Excl. in [place]? --     Constitutional: Alert and oriented. Well appearing and in no acute distress. Eyes: Conjunctivae are normal. PERRL. EOMI. Head: Atraumatic. Nose: No congestion/rhinnorhea. Neck: No stridor.   Cardiovascular: Normal rate, regular rhythm. Grossly normal heart sounds.  Good peripheral circulation. Respiratory: Normal respiratory effort.  No retractions. Lungs CTAB. Gastrointestinal: . No distention. Musculoskeletal: No lower extremity tenderness nor edema.  No joint effusions. Neurologic:  Normal  speech and language. No gross focal neurologic deficits are appreciated. No gait instability. Skin:  Skin is warm, dry and intact. No rash noted. Psychiatric: Mood and affect are normal. Speech and behavior are normal.  ____________________________________________   LABS (all labs ordered are listed, but only abnormal results are displayed)  Labs Reviewed - No data to display  PROCEDURES  Procedure(s) performed: None  Critical Care performed: No  ____________________________________________   INITIAL IMPRESSION / ASSESSMENT AND PLAN / ED COURSE  Pertinent labs & imaging results that were available during my care of the patient were reviewed by me and considered in my medical decision making (see chart for details).  Patient will return for her remaining scheduled immunizations. ____________________________________________   FINAL CLINICAL IMPRESSION(S) / ED DIAGNOSES  Final diagnoses:  Need for prophylactic vaccination against rabies      Johnn Hai, PA-C 09/02/14 [place], MD 09/02/14 [phone]

## 2014-09-14 ENCOUNTER — Encounter: Payer: Self-pay | Admitting: *Deleted

## 2014-09-14 ENCOUNTER — Emergency Department
Admission: EM | Admit: 2014-09-14 | Discharge: 2014-09-15 | Discharge status: Against Medical Advice | Payer: Federal, State, Local not specified - PPO | Attending: Emergency Medicine | Admitting: Emergency Medicine

## 2014-09-14 DIAGNOSIS — Z23 Encounter for immunization: Secondary | ICD-10-CM | POA: Insufficient documentation

## 2014-09-14 DIAGNOSIS — E119 Type 2 diabetes mellitus without complications: Secondary | ICD-10-CM | POA: Diagnosis not present

## 2014-09-14 MED ORDER — RABIES VACCINE, PCEC IM SUSR
INTRAMUSCULAR | Status: AC
  Administered 2014-09-14: 1 mL via INTRAMUSCULAR
  Filled 2014-09-14: qty 1

## 2014-09-14 MED ORDER — RABIES VACCINE, PCEC IM SUSR
1.0000 mL | Freq: Once | INTRAMUSCULAR | Status: AC
  Administered 2014-09-14: 1 mL via INTRAMUSCULAR

## 2014-09-14 NOTE — ED Notes (Signed)
Pt here for last rabies vaccine, no complaints.

## 2014-09-14 NOTE — ED Notes (Signed)
Pt states he is here for rabies injection (inital injection on 8/3), here for 5th dose.

## 2014-09-15 ENCOUNTER — Ambulatory Visit: Payer: Self-pay | Admitting: Family Medicine

## 2014-09-15 NOTE — ED Notes (Signed)
Pt left prior to discharge instructions, verbal instructions given per Dr Beather Arbour.

## 2014-12-06 ENCOUNTER — Ambulatory Visit: Payer: Federal, State, Local not specified - PPO | Admitting: Internal Medicine

## 2015-01-27 ENCOUNTER — Telehealth: Payer: Self-pay

## 2015-01-27 ENCOUNTER — Encounter: Payer: Self-pay | Admitting: Family Medicine

## 2015-01-27 ENCOUNTER — Telehealth: Payer: Self-pay | Admitting: *Deleted

## 2015-01-27 ENCOUNTER — Ambulatory Visit (INDEPENDENT_AMBULATORY_CARE_PROVIDER_SITE_OTHER): Payer: Federal, State, Local not specified - PPO | Admitting: Family Medicine

## 2015-01-27 VITALS — BP 114/78 | HR 84 | Temp 97.7°F | Ht 70.0 in | Wt 234.0 lb

## 2015-01-27 DIAGNOSIS — M10079 Idiopathic gout, unspecified ankle and foot: Secondary | ICD-10-CM

## 2015-01-27 DIAGNOSIS — N529 Male erectile dysfunction, unspecified: Secondary | ICD-10-CM | POA: Diagnosis not present

## 2015-01-27 DIAGNOSIS — M25562 Pain in left knee: Secondary | ICD-10-CM | POA: Insufficient documentation

## 2015-01-27 DIAGNOSIS — E1169 Type 2 diabetes mellitus with other specified complication: Secondary | ICD-10-CM | POA: Insufficient documentation

## 2015-01-27 DIAGNOSIS — Z23 Encounter for immunization: Secondary | ICD-10-CM | POA: Diagnosis not present

## 2015-01-27 DIAGNOSIS — E669 Obesity, unspecified: Secondary | ICD-10-CM | POA: Diagnosis not present

## 2015-01-27 DIAGNOSIS — E119 Type 2 diabetes mellitus without complications: Secondary | ICD-10-CM | POA: Insufficient documentation

## 2015-01-27 DIAGNOSIS — J302 Other seasonal allergic rhinitis: Secondary | ICD-10-CM

## 2015-01-27 DIAGNOSIS — E66811 Obesity, class 1: Secondary | ICD-10-CM

## 2015-01-27 DIAGNOSIS — M109 Gout, unspecified: Secondary | ICD-10-CM

## 2015-01-27 LAB — BASIC METABOLIC PANEL
BUN: 15 mg/dL (ref 6–23)
CHLORIDE: 103 meq/L (ref 96–112)
CO2: 32 meq/L (ref 19–32)
CREATININE: 1.06 mg/dL (ref 0.40–1.50)
Calcium: 9.7 mg/dL (ref 8.4–10.5)
GFR: 75.59 mL/min (ref >60.00)
GLUCOSE: 117 mg/dL — AB (ref 70–99)
Potassium: 4.3 mEq/L (ref 3.5–5.1)
Sodium: 140 mEq/L (ref 135–145)

## 2015-01-27 LAB — URIC ACID: URIC ACID, SERUM: 4.8 mg/dL (ref 4.0–7.8)

## 2015-01-27 LAB — LIPID PANEL
CHOL/HDL RATIO: 5
Cholesterol: 204 mg/dL — ABNORMAL HIGH (ref 0–200)
HDL: 40.9 mg/dL (ref >39.00)
LDL CALC: 141 mg/dL — AB (ref 0–99)
NonHDL: 163.39
Triglycerides: 111 mg/dL (ref 0.0–149.0)
VLDL: 22.2 mg/dL (ref 0.0–40.0)

## 2015-01-27 LAB — HEMOGLOBIN A1C: HEMOGLOBIN A1C: 5.8 % (ref 4.6–6.5)

## 2015-01-27 MED ORDER — METFORMIN HCL ER (MOD) 500 MG PO TB24: 1000.0000 mg | ORAL_TABLET | Freq: Every day | ORAL | Status: DC

## 2015-01-27 NOTE — Assessment & Plan Note (Addendum)
Await records. ?DM vs prediabetes. Check A1c today.  Reassess at CPE in 2 months.  Consider diabetes education, foot exam, eye exam, microalb. HLD, A1c, BMP.

## 2015-01-27 NOTE — Addendum Note (Signed)
Addended by: Royann Shivers A on: 01/27/2015 10:48 AM   Modules accepted: Orders

## 2015-01-27 NOTE — Assessment & Plan Note (Signed)
Check uric acid. Well controlled on allopurinol 300mg  daily.  Sounds like he has colchicine to use PRN gout flares at home.

## 2015-01-27 NOTE — Progress Notes (Signed)
BP 114/78 mmHg  Pulse 84  Temp(Src) 97.7 F (36.5 C) (Oral)  Ht 5\' 10"  (1.778 m)  Wt 234 lb (106.142 kg)  BMI 33.58 kg/m2   CC: new pt to establish  Subjective:    Patient ID: Richard Case, male    DOB: [birth year], 60 y.o.   MRN: [id number]  HPI: Richard Case is a 60 y.o. male presenting on 01/27/2015 for [place]   Requests CPE today.  L anterior knee pain ongoing for last 2 wk. Hasn't tried anything for this. Denies inciting trauma/falls or injury. May have started after prolonged sitting at [employer].  More physical work at house. No locking or instability. More achy going up and down stairs.  DM - regularly does not check sugars at home. Compliant with antihyperglycemic regimen which includes: metformin 1000mg  (glumetza). Unsure last A1c. Denies hypoglycemic symptoms. Mild dizziness with standing. Denies paresthesias. Last diabetic eye exam DUE. Pneumovax: NOT DONE. H/o PNA 2014. Prevnar: not due.  No results found for: HGBA1C Diabetic Foot Exam - Simple   No data filed       Gout - compliant with allopurinol 300mg  daily. Has seen podiatrist before, may re establish locally. Takes ?colchicine prn gout flare. Last flare was remotely.   Intermittent L shoulder ache - ?RTC injury sustained while moving.   Lives with wife [name], 1 dog Occupation: retired - Civil engineer, contracting for [employer] Edu: BS Activity: active in yard Diet: good water, fruits/vegetables daily  Relevant past medical, surgical, family and social history reviewed and updated as indicated. Interim medical history since our last visit reviewed. Allergies and medications reviewed and updated. No current outpatient prescriptions on file prior to visit.   No current facility-administered medications on file prior to visit.    Review of Systems Per HPI unless specifically indicated in ROS section     Objective:    BP 114/78 mmHg  Pulse 84  Temp(Src) 97.7 F (36.5 C) (Oral)  Ht 5\' 10"   (1.778 m)  Wt 234 lb (106.142 kg)  BMI 33.58 kg/m2  Wt Readings from Last 3 Encounters:  01/27/15 234 lb (106.142 kg)  09/02/14 230 lb (104.327 kg)  08/24/14 230 lb (104.327 kg)    Physical Exam  Constitutional: He is oriented to person, place, and time. He appears well-developed and well-nourished. No distress.  HENT:  Head: Normocephalic and atraumatic.  Right Ear: Hearing, tympanic membrane, external ear and ear canal normal.  Left Ear: Hearing, tympanic membrane, external ear and ear canal normal.  Nose: Nose normal.  Mouth/Throat: Uvula is midline, oropharynx is clear and moist and mucous membranes are normal. No oropharyngeal exudate, posterior oropharyngeal edema or posterior oropharyngeal erythema.  Eyes: Conjunctivae and EOM are normal. Pupils are equal, round, and reactive to light. No scleral icterus.  Neck: Normal range of motion. Neck supple. No thyromegaly present.  Cardiovascular: Normal rate, regular rhythm, normal heart sounds and intact distal pulses.   No murmur heard. Pulses:      Radial pulses are 2+ on the right side, and 2+ on the left side.  Pulmonary/Chest: Effort normal and breath sounds normal. No respiratory distress. He has no wheezes. He has no rales.  Musculoskeletal: Normal range of motion. He exhibits no edema.  L knee WNL R Knee exam: No deformity on inspection. + pain with palpation of medial joint line. No effusion/swelling noted. FROM in flex/extension without crepitus. No popliteal fullness. Neg drawer test. Mildly positive mcmurray test. + pain with valgus stress. No  PFgrind. No abnormal patellar mobility.   Lymphadenopathy:    He has no cervical adenopathy.  Neurological: He is alert and oriented to person, place, and time.  CN grossly intact, station and gait intact  Skin: Skin is warm and dry. No rash noted.  Psychiatric: He has a normal mood and affect. His behavior is normal. Judgment and thought content normal.  Nursing note and  vitals reviewed.  No results found for this or any previous visit.    Assessment & Plan:   Problem List Items Addressed This Visit    Seasonal allergic rhinitis   Obesity, Class I, BMI 30-34.9    Endorses recent weight loss with increased activity and 2 story house. Continue to monitor.      Relevant Medications   metFORMIN (GLUMETZA) 500 MG (MOD) 24 hr tablet   Left medial knee pain    Exam consistent with MCL strain + possible meniscal injury, mild. Treat with NSAID prn, strengthening exercises from SM pt advisor, and rec knee brace for further support.  Update if not improved for PT referral.       Gout    Check uric acid. Well controlled on allopurinol 300mg  daily.  Sounds like he has colchicine to use PRN gout flares at home.       Relevant Orders   Uric acid   Erectile dysfunction   Diabetes mellitus without complication ([place]) - Primary    Await records. ?DM vs prediabetes. Check A1c today.  Reassess at CPE in 2 months.  Consider diabetes education, foot exam, eye exam, microalb. HLD, A1c, BMP.       Relevant Medications   metFORMIN (GLUMETZA) 500 MG (MOD) 24 hr tablet   Other Relevant Orders   Hemoglobin 123456   Basic metabolic panel   Lipid panel    Other Visit Diagnoses    Need for influenza vaccination        Relevant Orders    Flu Vaccine QUAD 36+ mos PF IM (Fluarix & Fluzone Quad PF) (Completed)        Follow up plan: Return in about 2 months (around 03/27/2015), or as needed, for annual exam, prior fasting for blood work.

## 2015-01-27 NOTE — Telephone Encounter (Signed)
Patient told me as he was leaving his appt today that he wanted to talk to you at his next appt about short-term memory loss. He said this has been a problem for him for as long as he can remember, so it wasn't anything that needed to be addressed today, but he did want me to mention it to you as a reminder for his next visit.

## 2015-01-27 NOTE — Assessment & Plan Note (Signed)
Endorses recent weight loss with increased activity and 2 story house. Continue to monitor.

## 2015-01-27 NOTE — Patient Instructions (Addendum)
Flu shot today. Pneumovax today.  Labwork today.  Possible MCL strain - do strengthening exercises provided today. Buy neoprene sleeve for extra support. May use ibuprofen for discomfort. If no improvement, let me know for physical therapy Return in 2 months for physical.  Nice to meet you today, call us with questions.

## 2015-01-27 NOTE — Assessment & Plan Note (Signed)
Exam consistent with MCL strain + possible meniscal injury, mild. Treat with NSAID prn, strengthening exercises from [facility] pt advisor, and rec knee brace for further support.  Update if not improved for PT referral.

## 2015-01-27 NOTE — Telephone Encounter (Signed)
Have not received question from pharmacist.

## 2015-01-27 NOTE — Telephone Encounter (Signed)
[facility] said [facility] not [facility]; spoke with [place] at [facility] and they did receive the rx but Congo faxed info to Dr Darnell Level about a question for ins approval.

## 2015-01-27 NOTE — Progress Notes (Signed)
Pre visit review using our clinic review tool, if applicable. No additional management support is needed unless otherwise documented below in the visit note. 

## 2015-01-27 NOTE — Telephone Encounter (Signed)
This is for PA. In your IN box for completion.

## 2015-01-28 ENCOUNTER — Other Ambulatory Visit: Payer: Self-pay | Admitting: Family Medicine

## 2015-01-28 MED ORDER — METFORMIN HCL ER (MOD) 500 MG PO TB24: 500.0000 mg | ORAL_TABLET | Freq: Every day | ORAL | Status: DC

## 2015-01-28 NOTE — Telephone Encounter (Signed)
Filled and in Kim's box. May not be approved

## 2015-01-30 ENCOUNTER — Other Ambulatory Visit: Payer: Self-pay | Admitting: Family Medicine

## 2015-01-30 MED ORDER — ALLOPURINOL 100 MG PO TABS: 200.0000 mg | ORAL_TABLET | Freq: Every day | ORAL | Status: DC

## 2015-01-30 NOTE — Telephone Encounter (Signed)
Form faxed. Will await approval.

## 2015-02-01 NOTE — Telephone Encounter (Signed)
Additional info required. In your IN box for review.

## 2015-02-01 NOTE — Telephone Encounter (Signed)
Pt request call to get override so pt can get metformin filled. Call [phone] and plan # is [phone] and request override. Pt request cb.

## 2015-02-02 NOTE — Telephone Encounter (Signed)
Filled and in Kim's box. 

## 2015-02-02 NOTE — Telephone Encounter (Signed)
PA faxed. Will await determination. 

## 2015-02-04 ENCOUNTER — Encounter: Payer: Self-pay | Admitting: Family Medicine

## 2015-02-04 DIAGNOSIS — E1169 Type 2 diabetes mellitus with other specified complication: Secondary | ICD-10-CM | POA: Insufficient documentation

## 2015-02-04 DIAGNOSIS — D751 Secondary polycythemia: Secondary | ICD-10-CM | POA: Insufficient documentation

## 2015-02-04 DIAGNOSIS — E785 Hyperlipidemia, unspecified: Secondary | ICD-10-CM

## 2015-02-04 DIAGNOSIS — G4733 Obstructive sleep apnea (adult) (pediatric): Secondary | ICD-10-CM | POA: Insufficient documentation

## 2015-02-04 DIAGNOSIS — N529 Male erectile dysfunction, unspecified: Secondary | ICD-10-CM | POA: Insufficient documentation

## 2015-02-20 ENCOUNTER — Telehealth: Payer: Self-pay | Admitting: Family Medicine

## 2015-02-20 NOTE — Telephone Encounter (Signed)
[facility]  Patient Name: Richard Case  DOB: 01/03/1956    Initial Comment Caller states he has had some flank pain today. He has a hx of kidney pain.    Nurse Assessment  Nurse: Wayne Sever, RN, Tillie Rung Date/Time (Eastern Time): 02/20/2015 1:46:34 PM  Confirm and document reason for call. If symptomatic, describe symptoms. You must click the next button to save text entered. ---Caller states he is having some pain on his right side. He states it's belly button level, but on the side. He states it's a dull ache. He has taken some Ibuprofen and it's not helping. He feels nauseated. He has had a kidney stone before and it reminds him of this.  Has the patient traveled out of the country within the last 30 days? ---Not Applicable  Does the patient have any new or worsening symptoms? ---Yes  Will a triage be completed? ---Yes  Related visit to physician within the last 2 weeks? ---No  Does the PT have any chronic conditions? (i.e. diabetes, asthma, etc.) ---No  Is this a behavioral health or substance abuse call? ---No     Guidelines    Guideline Title Affirmed Question Affirmed Notes  Flank Pain MODERATE pain (e.g., interferes with normal activities or awakens from sleep)    Final Disposition User   See Physician within 24 Hours [place], RN, [employer]    Referrals  REFERRED TO PCP OFFICE   Disagree/Comply: Leta Baptist

## 2015-02-20 NOTE — Telephone Encounter (Signed)
Pt has appt with Allie Bossier NP on 02/21/15 at 9:30 AM.

## 2015-02-21 ENCOUNTER — Ambulatory Visit (INDEPENDENT_AMBULATORY_CARE_PROVIDER_SITE_OTHER)
Admission: RE | Admit: 2015-02-21 | Discharge: 2015-02-21 | Disposition: A | Discharge status: Home / Self Care | Payer: Federal, State, Local not specified - PPO | Source: Ambulatory Visit | Attending: Primary Care | Admitting: Primary Care

## 2015-02-21 ENCOUNTER — Telehealth: Payer: Self-pay

## 2015-02-21 ENCOUNTER — Ambulatory Visit (INDEPENDENT_AMBULATORY_CARE_PROVIDER_SITE_OTHER): Payer: Federal, State, Local not specified - PPO | Admitting: Primary Care

## 2015-02-21 VITALS — BP 116/74 | HR 74 | Temp 97.4°F | Ht 70.0 in | Wt 235.8 lb

## 2015-02-21 DIAGNOSIS — R109 Unspecified abdominal pain: Secondary | ICD-10-CM

## 2015-02-21 DIAGNOSIS — N200 Calculus of kidney: Secondary | ICD-10-CM

## 2015-02-21 LAB — POC URINALSYSI DIPSTICK (AUTOMATED)
Bilirubin, UA: NEGATIVE
Glucose, UA: NEGATIVE
KETONES UA: NEGATIVE
Leukocytes, UA: NEGATIVE
Nitrite, UA: NEGATIVE
Protein, UA: NEGATIVE
SPEC GRAV UA: 1.025
Urobilinogen, UA: NEGATIVE
pH, UA: 6

## 2015-02-21 MED ORDER — TRAMADOL HCL 50 MG PO TABS
50.0000 mg | ORAL_TABLET | Freq: Four times a day (QID) | ORAL | Status: DC | PRN
Start: 2015-02-21 — End: 2015-03-28

## 2015-02-21 MED ORDER — TAMSULOSIN HCL 0.4 MG PO CAPS: 0.4000 mg | ORAL_CAPSULE | Freq: Every day | ORAL | Status: DC

## 2015-02-21 NOTE — Telephone Encounter (Signed)
Pt left v/m 4:43 pm; pt was seen 02/21/15; pain in side due to kidney stone; pain has worsened and pt wants to know if can get different med from ibuprofen pt can take to manage pain. Pt [facility]

## 2015-02-21 NOTE — Patient Instructions (Signed)
Your urine does show mild blood which may be caused by a kidney stone.  Complete xray(s) prior to leaving today. I will notify you of your results once received.  Increase consumption of water to push any possible stone through. Continue iburprofen as needed for pain.  Please call me if the pain becomes worse or if you develop fevers, nausea, vomiting, abdominal pain, difficulty urinating.  It was a pleasure meeting you!

## 2015-02-21 NOTE — Telephone Encounter (Signed)
Will send in Tramadol for pain and Flomax to help release stone. Patient notified.

## 2015-02-21 NOTE — Progress Notes (Signed)
Subjective:    Patient ID: Richard Case, male    DOB: [birth year], 61 y.o.   MRN: [id number]  HPI  Mr. Sinanan is a 60 year old male who presents today with a chief complaint of flank pain. His pain is located to the right lateral abdomen/flank and RLQ. This pain began suddenly yesterday afternoon with pain originating from right flank down to right groin region. His pain continued throughout the evening last night. He took ibuprofen with some improvement. This morning his pain has nearly resolved.   He describes the initial pain as sudden, sharp, "like someone punched me". He had some nausea yesterday. He has a history of kidney stones 5-6 years ago. Denies hematuria, frequency, difficulty urinating, fevers. He does not drink as much water as he should.   Review of Systems  Constitutional: Negative for fever and chills.  Gastrointestinal: Negative for nausea, vomiting, abdominal pain, diarrhea and constipation.       Past Medical History  Diagnosis Date  . Gout   . GERD (gastroesophageal reflux disease)   . Seasonal allergic rhinitis   . Heart murmur longstanding  . Elevated blood pressure (not hypertension)   . History of kidney stones 2008  . Benign colon polyp ?2014    hyperplastic  . Erectile dysfunction   . Obesity, Class I, BMI 30-34.9   . 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 BPH (benign prostatic hypertrophy)   . HLD (hyperlipidemia)   . Prediabetes   . Male erectile dysfunction   . OSA (obstructive sleep apnea)     per prior pcp records - improved with weight loss  . Rosacea     per prior pcp records  . Polycythemia     ?OSA related  . Peyronie disease     per prior pcp records  . Atrial ectopy 2011    improved with CPAP, documented by holter  . ADD (attention deficit disorder) 2011    no records of workup; strattera caused urinary retention, not interested in habit forming medication  . History of pneumonia 2009    Social History    Social History  . Marital Status: Married    Spouse Name: N/A  . Number of Children: N/A  . Years of Education: N/A   Occupational History  . Not on file.   Social History Main Topics  . Smoking status: Never Smoker   . Smokeless tobacco: Never Used  . Alcohol Use: No     Comment: rarely  . Drug Use: No  . Sexual Activity: Not on file   Other Topics Concern  . Not on file   Social History Narrative   Lives with wife Aiken, 1 dog   Occupation: retired - Civil engineer, contracting for [employer]   Edu: BS   Activity: active in yard   Diet: good water, fruits/vegetables daily    Past Surgical History  Procedure Laterality Date  . Mohs surgery  2015    SCC of nose  . Tonsillectomy    . Colonoscopy  2009    rec rpt 5 yrs per prior PCP records but no actual report    Family History  Problem Relation Age of Onset  . Cancer Father 28    prostate  . Cancer Mother     lung  . Lung disease Mother   . CAD Father     possibly?  . Stroke Neg Hx   . Diabetes Neg Hx  Allergies  Allergen Reactions  . Strattera [Atomoxetine Hcl] Other (See Comments)    Urinary retention    Current Outpatient Prescriptions on File Prior to Visit  Medication Sig Dispense Refill  . allopurinol (ZYLOPRIM) 100 MG tablet Take 2 tablets (200 mg total) by mouth daily. (Patient taking differently: Take 100 mg by mouth daily. ) 180 tablet 3  . metFORMIN (GLUMETZA) 500 MG (MOD) 24 hr tablet Take 1 tablet (500 mg total) by mouth daily with supper. (Patient not taking: Reported on 02/21/2015) 90 tablet 3   No current facility-administered medications on file prior to visit.    BP 116/74 mmHg  Pulse 74  Temp(Src) 97.4 F (36.3 C) (Oral)  Ht 5\' 10"  (1.778 m)  Wt 235 lb 12.8 oz (106.958 kg)  BMI 33.83 kg/m2  SpO2 98%    Objective:   Physical Exam  Constitutional: He appears well-nourished.  Cardiovascular: Normal rate and regular rhythm.   Pulmonary/Chest: Effort normal and breath sounds  normal.  Abdominal: Soft. Normal appearance and bowel sounds are normal. There is tenderness in the right lower quadrant. There is no rebound, no guarding, no CVA tenderness and negative Murphy's sign.  Skin: Skin is warm and dry.          Assessment & Plan:  Flank pain:  Located to right flank and groin. Occurred suddenly yesterday afternoon. History of kidney stones 5-6 years ago. Today pain nearly resolved, no nausea, able to eat and drink. Exam with mild tenderness to RLQ. Does not appear ill/sickly.  No concern for pyelonephritis.  UA: Trace blood, negative for leuks or nitrites. KUB today with: Probable renal stones to right side. Will have him increase consumption of water, continue ibuprofen, and notify me if pain increases, fevers, difficulty urinating.  He verbalized understanding.

## 2015-02-21 NOTE — Progress Notes (Signed)
Pre visit review using our clinic review tool, if applicable. No additional management support is needed unless otherwise documented below in the visit note. 

## 2015-02-22 ENCOUNTER — Telehealth: Payer: Self-pay

## 2015-02-22 DIAGNOSIS — R110 Nausea: Secondary | ICD-10-CM

## 2015-02-22 MED ORDER — PROMETHAZINE HCL 25 MG RE SUPP: 25.0000 mg | Freq: Four times a day (QID) | RECTAL | Status: DC | PRN

## 2015-02-22 NOTE — Telephone Encounter (Signed)
Mrs General said pt did not go to ED; pt said found 2 tabs of older oxycodone and took for pain last night. Pt does not have any more oxycodone. Pt said picked up flomax but there was no med for pain. [facility] called [facility] spoke with Angela Nevin and Tramadol is ready for pick up. Angela Nevin said Flomax was already picked up. I asked why pt would not have been given both meds at time of Flomax pick up. Angela Nevin could not answer question. Pt advised and voiced understanding. Pt also had vomiting last night and request [facility]gan suppositories to [facility].  Now pt is pain free and no N&V but pt wants phenergan supp on hand in case needs them. Pt understands not to take tramadol and oxycodone together. Pt said has no more oxycodone. Pt request cb when phenergan supp sent to pharmacy.

## 2015-02-22 NOTE — Telephone Encounter (Signed)
PLEASE NOTE: All timestamps contained within this report are represented as [place] Standard Time. CONFIDENTIALTY NOTICE: This fax transmission is intended only for the addressee. It contains information that is legally privileged, confidential or otherwise protected from use or disclosure. If you are not the intended recipient, you are strictly prohibited from reviewing, disclosing, copying using or disseminating any of this information or taking any action in reliance on or regarding this information. If you have received this fax in error, please notify us immediately by telephone so that we can arrange for its return to [place]. Phone: [phone], Toll-Free: [phone], Fax: [phone] Page: 1 of 1 Call Id: [id number] [place] Patient Name: Richard Case Gender: Male DOB: July 14, 1955 Age: 60 Y 9 M 30 D Return Phone Number: [phone] (Primary), [phone] (Secondary) Address: City/State/Zip: [place] Day - Client Client Site [place] - Day Physician Alma Friendly Contact Type Call Who Is Calling Patient / Member / Family / Caregiver Call Type Triage / Clinical Relationship To Patient Self Return Phone Number [phone] (Secondary) Chief Complaint Vomiting Reason for Call Symptomatic / Request for [place] states he spoke with nurse earlier about kidney stones and wants to know if he can get a rx for nausea and vomiting so he can sleep. Translation No Nurse Assessment Guidelines Guideline Title Affirmed Question Affirmed Notes Nurse Date/Time (Eastern Time) Disp. Time Eilene Ghazi Time) Disposition Final User 02/21/2015 11:54:08 PM Attempt made - no message left Leonia Reader 02/22/2015 12:14:57 AM FINAL ATTEMPT MADE - no message left Yes Lillia Corporal, RN, Lenell Antu

## 2015-02-22 NOTE — Telephone Encounter (Signed)
PLEASE NOTE: All timestamps contained within this report are represented as [place] Standard Time. CONFIDENTIALTY NOTICE: This fax transmission is intended only for the addressee. It contains information that is legally privileged, confidential or otherwise protected from use or disclosure. If you are not the intended recipient, you are strictly prohibited from reviewing, disclosing, copying using or disseminating any of this information or taking any action in reliance on or regarding this information. If you have received this fax in error, please notify us immediately by telephone so that we can arrange for its return to [place]. Phone: [phone], Toll-Free: [phone], Fax: [phone] Page: 1 of 2 Call Id: [id number] [place] Patient Name: Richard Case Gender: Male DOB: [birth year] Age: 60 Y 8 M 30 D Return Phone Number: [phone] (Primary), [phone] (Secondary) Address: City/State/Zip: [place] Client [place] [facility] - Client Client Site [place] - Day Physician Alma Friendly Contact Type Call Who Is Calling Patient / Member / Family / Caregiver Call Type Triage / Clinical Caller Name Ms Sabol Relationship To Patient Self Return Phone Number [phone] (Secondary) Chief Complaint Flank Pain Reason for Call Symptomatic / Request for Health Information Initial Comment Caller states Dr. Carlis Abbott called in meds for him but the pharmacy needs the DR to call them to authorize to release pain meds for his kidney stone. Translation No Nurse Assessment Nurse: Luther Parody, RN, [name] Date/Time (Eastern Time): 02/21/2015 7:46:13 PM Confirm and document reason for call. If symptomatic, describe symptoms. You must click the next button to save text entered. ---Caller states that he began having flank pain last pm and was seen in the office earlier  today. The pain had eased some and he was dx with a kidney stone. Later this afternoon the pain returned and was very intense so he called Dr Carlis Abbott and she was going to send in an rx for a pain med and flomax. States that the flomax was received but not the pain meds. Pt is vomiting while on with nurse and in obvious pain. Has the patient traveled out of the country within the last 30 days? ---Not Applicable Does the patient have any new or worsening symptoms? ---No Please document clinical information provided and list any resource used. ---Advised that I would page the oncall to discuss but advised that pain meds cannot as a rule be called in after hrs as they are controlled meds. Verbalized understanding. Guidelines Guideline Title Affirmed Question Affirmed Notes Nurse Date/Time (Eastern Time) Disp. Time Eilene Ghazi Time) Disposition Final User 02/21/2015 7:57:09 PM Paged On Call back to [facility] [place], [name] 02/21/2015 8:14:54 PM Clinical Call Yes Luther Parody, RN, [name] PLEASE NOTE: All timestamps contained within this report are represented as [place] Standard Time. CONFIDENTIALTY NOTICE: This fax transmission is intended only for the addressee. It contains information that is legally privileged, confidential or otherwise protected from use or disclosure. If you are not the intended recipient, you are strictly prohibited from reviewing, disclosing, copying using or disseminating any of this information or taking any action in reliance on or regarding this information. If you have received this fax in error, please notify us immediately by telephone so that we can arrange for its return to [place]. Phone: [phone], Toll-Free: [phone], Fax: [phone] Page: 2 of 2 Call Id: [id number] Comments User: Kassie Mends, RN Date/Time Eilene Ghazi Time): 02/21/2015 8:14:34 PM Spoke with pt and he does not wish to go  the ED unless he has to. Discussed using ibuprofen and dosage. Advised to try to  eat something before taking it and to go to the ed if the pain gets worse. Paging DoctorName Phone DateTime Result/Outcome Message Type Notes Ronette Deter [phone] 02/21/2015 7:57:09 PM Paged On Call Back to Call Center Doctor Paged Please call [name] RN @ [facility] Ronette Deter 02/21/2015 8:14:44 PM Spoke with On Call - General Message Result Instructed to send the pt to the ed because it sounds like he needs IV pain meds.

## 2015-02-22 NOTE — Telephone Encounter (Signed)
Noted. Tramadol was called into [facility] on 02/21/15 just after receiving phone note. Unsure of why this was unable to pick up. Also called in phenergan suppositories just now. Please notify patient this is ready for pick up.

## 2015-02-22 NOTE — Telephone Encounter (Signed)
Called and notified patient of Richard Case's comments. Patient verbalized understanding.  

## 2015-02-24 ENCOUNTER — Telehealth: Payer: Self-pay | Admitting: Primary Care

## 2015-02-24 ENCOUNTER — Other Ambulatory Visit: Payer: Self-pay | Admitting: Primary Care

## 2015-02-24 DIAGNOSIS — R110 Nausea: Secondary | ICD-10-CM

## 2015-02-24 MED ORDER — PROMETHAZINE HCL 25 MG RE SUPP: 25.0000 mg | Freq: Four times a day (QID) | RECTAL | Status: DC | PRN

## 2015-02-24 NOTE — Telephone Encounter (Signed)
Will you please call and check on Richard Case. How's he feeling? We received a refill request for phenergan suppositories that were prescribed 2 days ago. Is he out? He shouldn't be.

## 2015-02-24 NOTE — Telephone Encounter (Signed)
Noted. Refill sent.

## 2015-02-24 NOTE — Telephone Encounter (Signed)
Electronically refill request for   promethazine (PHENERGAN) 25 MG suppository   Place 1 suppository (25 mg total) rectally every 6 (six) hours as needed for nausea or vomiting.  Dispense: 12 each   Refills: 0     Last prescribed on 02/22/2015. Last seen on 02/21/2015. Lab appt on 03/21/2015. CPE on 03/28/2015.

## 2015-02-24 NOTE — Telephone Encounter (Addendum)
Called patient and he stated that he wanted to go ahead refill it before this weekend since he will run out by then. He is taking medication as instructed. Patient stated that he is okay since he taking the phenergan but afraid without, it will be painful since there is more than just one stone.

## 2015-03-21 ENCOUNTER — Other Ambulatory Visit: Payer: Federal, State, Local not specified - PPO

## 2015-03-22 ENCOUNTER — Other Ambulatory Visit: Payer: Self-pay | Admitting: Family Medicine

## 2015-03-22 ENCOUNTER — Other Ambulatory Visit (INDEPENDENT_AMBULATORY_CARE_PROVIDER_SITE_OTHER): Payer: Federal, State, Local not specified - PPO

## 2015-03-22 DIAGNOSIS — N400 Enlarged prostate without lower urinary tract symptoms: Secondary | ICD-10-CM

## 2015-03-22 DIAGNOSIS — M10079 Idiopathic gout, unspecified ankle and foot: Secondary | ICD-10-CM

## 2015-03-22 DIAGNOSIS — Z1159 Encounter for screening for other viral diseases: Secondary | ICD-10-CM

## 2015-03-22 DIAGNOSIS — Z0000 Encounter for general adult medical examination without abnormal findings: Secondary | ICD-10-CM

## 2015-03-22 DIAGNOSIS — E785 Hyperlipidemia, unspecified: Secondary | ICD-10-CM | POA: Diagnosis not present

## 2015-03-22 DIAGNOSIS — M109 Gout, unspecified: Secondary | ICD-10-CM

## 2015-03-22 DIAGNOSIS — D751 Secondary polycythemia: Secondary | ICD-10-CM | POA: Diagnosis not present

## 2015-03-22 LAB — CBC WITH DIFFERENTIAL/PLATELET
BASOS PCT: 0.5 % (ref 0.0–3.0)
Basophils Absolute: 0 10*3/uL (ref 0.0–0.1)
EOS PCT: 4.5 % (ref 0.0–5.0)
Eosinophils Absolute: 0.3 10*3/uL (ref 0.0–0.7)
HCT: 45.3 % (ref 39.0–52.0)
Hemoglobin: 15.7 g/dL (ref 13.0–17.0)
LYMPHS ABS: 2 10*3/uL (ref 0.7–4.0)
Lymphocytes Relative: 29.3 % (ref 12.0–46.0)
MCHC: 34.7 g/dL (ref 30.0–36.0)
MCV: 90.5 fl (ref 78.0–100.0)
MONO ABS: 0.9 10*3/uL (ref 0.1–1.0)
Monocytes Relative: 12.9 % — ABNORMAL HIGH (ref 3.0–12.0)
NEUTROS PCT: 52.8 % (ref 43.0–77.0)
Neutro Abs: 3.6 10*3/uL (ref 1.4–7.7)
Platelets: 210 10*3/uL (ref 150.0–400.0)
RBC: 5.01 Mil/uL (ref 4.22–5.81)
RDW: 13.3 % (ref 11.5–15.5)
WBC: 6.8 10*3/uL (ref 4.0–10.5)

## 2015-03-22 LAB — COMPREHENSIVE METABOLIC PANEL
ALK PHOS: 66 U/L (ref 39–117)
ALT: 21 U/L (ref 0–53)
AST: 19 U/L (ref 0–37)
Albumin: 4.2 g/dL (ref 3.5–5.2)
BILIRUBIN TOTAL: 0.9 mg/dL (ref 0.2–1.2)
BUN: 18 mg/dL (ref 6–23)
CO2: 30 meq/L (ref 19–32)
CREATININE: 1.12 mg/dL (ref 0.40–1.50)
Calcium: 9.6 mg/dL (ref 8.4–10.5)
Chloride: 103 mEq/L (ref 96–112)
GFR: 70.9 mL/min (ref >60.00)
GLUCOSE: 102 mg/dL — AB (ref 70–99)
Potassium: 4.4 mEq/L (ref 3.5–5.1)
SODIUM: 141 meq/L (ref 135–145)
TOTAL PROTEIN: 6.5 g/dL (ref 6.0–8.3)

## 2015-03-22 LAB — PSA: PSA: 1.16 ng/mL (ref 0.10–4.00)

## 2015-03-22 LAB — URIC ACID: Uric Acid, Serum: 5 mg/dL (ref 4.0–7.8)

## 2015-03-22 LAB — LDL CHOLESTEROL, DIRECT: Direct LDL: 118 mg/dL

## 2015-03-23 LAB — HEPATITIS C ANTIBODY: HCV Ab: NEGATIVE

## 2015-03-28 ENCOUNTER — Encounter: Payer: Self-pay | Admitting: Family Medicine

## 2015-03-28 ENCOUNTER — Ambulatory Visit (INDEPENDENT_AMBULATORY_CARE_PROVIDER_SITE_OTHER): Payer: Federal, State, Local not specified - PPO | Admitting: Family Medicine

## 2015-03-28 VITALS — BP 116/80 | HR 76 | Temp 97.5°F | Wt 232.8 lb

## 2015-03-28 DIAGNOSIS — R7303 Prediabetes: Secondary | ICD-10-CM

## 2015-03-28 DIAGNOSIS — R3912 Poor urinary stream: Secondary | ICD-10-CM

## 2015-03-28 DIAGNOSIS — G4733 Obstructive sleep apnea (adult) (pediatric): Secondary | ICD-10-CM

## 2015-03-28 DIAGNOSIS — Z0000 Encounter for general adult medical examination without abnormal findings: Secondary | ICD-10-CM

## 2015-03-28 DIAGNOSIS — E66811 Obesity, class 1: Secondary | ICD-10-CM

## 2015-03-28 DIAGNOSIS — Z860100 Personal history of colon polyps, unspecified: Secondary | ICD-10-CM

## 2015-03-28 DIAGNOSIS — M109 Gout, unspecified: Secondary | ICD-10-CM

## 2015-03-28 DIAGNOSIS — Z1211 Encounter for screening for malignant neoplasm of colon: Secondary | ICD-10-CM | POA: Diagnosis not present

## 2015-03-28 DIAGNOSIS — M25512 Pain in left shoulder: Secondary | ICD-10-CM

## 2015-03-28 DIAGNOSIS — N400 Enlarged prostate without lower urinary tract symptoms: Secondary | ICD-10-CM | POA: Insufficient documentation

## 2015-03-28 DIAGNOSIS — M10079 Idiopathic gout, unspecified ankle and foot: Secondary | ICD-10-CM

## 2015-03-28 DIAGNOSIS — M25562 Pain in left knee: Secondary | ICD-10-CM

## 2015-03-28 DIAGNOSIS — E669 Obesity, unspecified: Secondary | ICD-10-CM

## 2015-03-28 DIAGNOSIS — Z23 Encounter for immunization: Secondary | ICD-10-CM

## 2015-03-28 DIAGNOSIS — E785 Hyperlipidemia, unspecified: Secondary | ICD-10-CM

## 2015-03-28 MED ORDER — ALLOPURINOL 100 MG PO TABS: 100.0000 mg | ORAL_TABLET | Freq: Every day | ORAL | Status: DC

## 2015-03-28 NOTE — Progress Notes (Addendum)
BP 116/80 mmHg  Pulse 76  Temp(Src) 97.5 F (36.4 C) (Oral)  Wt 232 lb 12 oz (105.575 kg)   CC: CPE  Subjective:    Patient ID: Richard Case, male    DOB: 1955-12-28, 60 y.o.   MRN: [id number]  HPI: Richard Case is a 60 y.o. male presenting on 03/28/2015 for Annual Exam   Recent R flank pain thought due to kidney stone treated with tramadol flomax and phenergan suppositories. Pain has resolved.   L knee continues bothering him. See prior note for details. Will consider PT referral. Uses ibuprofen intermittently. Also with L shoulder pain after moving.   Preventative: COLONOSCOPY Date: 2009 rec rpt 5 yrs per prior PCP records but no actual report. States he had polyps found.  Prostate cancer screening - will screen given fmhx. Notices worsening weakening of stream and incomplete emptying as well as splitting of stream, ongoing for years. Requests urology referral.  Flu shot yearly Pneumovax 2017 Tetanus - unsure, requests Tdap today. zostavax - will check with insurance Seat belt use discussed Sunscreen use discussed. No changing moles on skin.   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 Interim medical history since our last visit reviewed. Allergies and medications reviewed and updated. Current Outpatient Prescriptions on File Prior to Visit  Medication Sig  . metFORMIN (GLUMETZA) 500 MG (MOD) 24 hr tablet Take 1 tablet (500 mg total) by mouth daily with supper. (Patient taking differently: Take 1,000 mg by mouth daily after supper. )   No current facility-administered medications on file prior to visit.    Review of Systems  Constitutional: Negative for fever, chills, activity change, appetite change, fatigue and unexpected weight change.  HENT: Negative  for hearing loss.   Eyes: Negative for visual disturbance.  Respiratory: Positive for chest tightness. Negative for cough, shortness of breath and wheezing.   Cardiovascular: Negative for chest pain, palpitations and leg swelling.  Gastrointestinal: Negative for nausea, vomiting, abdominal pain, diarrhea, constipation, blood in stool and abdominal distention.  Genitourinary: Negative for hematuria and difficulty urinating.  Musculoskeletal: Negative for myalgias, arthralgias and neck pain.  Skin: Negative for rash.  Neurological: Negative for dizziness, seizures, syncope and headaches.  Hematological: Negative for adenopathy. Does not bruise/bleed easily.  Psychiatric/Behavioral: Negative for dysphoric mood. The patient is not nervous/anxious.    Per HPI unless specifically indicated in ROS section     Objective:    BP 116/80 mmHg  Pulse 76  Temp(Src) 97.5 F (36.4 C) (Oral)  Wt 232 lb 12 oz (105.575 kg)  Wt Readings from Last 3 Encounters:  03/28/15 232 lb 12 oz (105.575 kg)  02/21/15 235 lb 12.8 oz (106.958 kg)  01/27/15 234 lb (106.142 kg)   Body mass index is 33.4 kg/(m^2).  Physical Exam  Constitutional: He is oriented to person, place, and time. He appears well-developed and well-nourished. No distress.  HENT:  Head: Normocephalic and atraumatic.  Right Ear: Hearing, tympanic membrane, external ear and ear canal normal.  Left Ear: Hearing, tympanic membrane, external ear and ear canal normal.  Nose: Nose normal.  Mouth/Throat: Uvula is midline, oropharynx is clear and moist and mucous membranes are normal. No oropharyngeal exudate, posterior oropharyngeal edema or posterior oropharyngeal erythema.  Eyes: Conjunctivae and EOM are normal. Pupils are equal, round, and reactive to light.  No scleral icterus.  Neck: Normal range of motion. Neck supple. Carotid bruit is not present. No thyromegaly present.  Cardiovascular: Normal rate, regular rhythm, normal heart sounds and  intact distal pulses.   No murmur heard. Pulses:      Radial pulses are 2+ on the right side, and 2+ on the left side.  Pulmonary/Chest: Effort normal and breath sounds normal. No respiratory distress. He has no wheezes. He has no rales.  Abdominal: Soft. Bowel sounds are normal. He exhibits no distension and no mass. There is no tenderness. There is no rebound and no guarding.  Genitourinary: Rectum normal and prostate normal. Rectal exam shows no external hemorrhoid, no internal hemorrhoid, no fissure, no mass, no tenderness and anal tone normal. Prostate is not enlarged (~15gm) and not tender.  Musculoskeletal: Normal range of motion. He exhibits no edema.  Lymphadenopathy:    He has no cervical adenopathy.  Neurological: He is alert and oriented to person, place, and time.  CN grossly intact, station and gait intact  Skin: Skin is warm and dry. No rash noted.  Psychiatric: He has a normal mood and affect. His behavior is normal. Judgment and thought content normal.  Nursing note and vitals reviewed.  Results for orders placed or performed in visit on 03/22/15  Comprehensive metabolic panel  Result Value Ref Range   Sodium 141 135 - 145 mEq/L   Potassium 4.4 3.5 - 5.1 mEq/L   Chloride 103 96 - 112 mEq/L   CO2 30 19 - 32 mEq/L   Glucose, Bld 102 (H) 70 - 99 mg/dL   BUN 18 6 - 23 mg/dL   Creatinine, Ser 1.12 0.40 - 1.50 mg/dL   Total Bilirubin 0.9 0.2 - 1.2 mg/dL   Alkaline Phosphatase 66 39 - 117 U/L   AST 19 0 - 37 U/L   ALT 21 0 - 53 U/L   Total Protein 6.5 6.0 - 8.3 g/dL   Albumin 4.2 3.5 - 5.2 g/dL   Calcium 9.6 8.4 - 10.5 mg/dL   GFR 70.90 >60.00 mL/min  PSA  Result Value Ref Range   PSA 1.16 0.10 - 4.00 ng/mL  Uric acid  Result Value Ref Range   Uric Acid, Serum 5.0 4.0 - 7.8 mg/dL  LDL Cholesterol, Direct  Result Value Ref Range   Direct LDL 118.0 mg/dL  CBC with Differential/Platelet  Result Value Ref Range   WBC 6.8 4.0 - 10.5 K/uL   RBC 5.01 4.22 - 5.81  Mil/uL   Hemoglobin 15.7 13.0 - 17.0 g/dL   HCT 45.3 39.0 - 52.0 %   MCV 90.5 78.0 - 100.0 fl   MCHC 34.7 30.0 - 36.0 g/dL   RDW 13.3 11.5 - 15.5 %   Platelets 210.0 150.0 - 400.0 K/uL   Neutrophils Relative % 52.8 43.0 - 77.0 %   Lymphocytes Relative 29.3 12.0 - 46.0 %   Monocytes Relative 12.9 (H) 3.0 - 12.0 %   Eosinophils Relative 4.5 0.0 - 5.0 %   Basophils Relative 0.5 0.0 - 3.0 %   Neutro Abs 3.6 1.4 - 7.7 K/uL   Lymphs Abs 2.0 0.7 - 4.0 K/uL   Monocytes Absolute 0.9 0.1 - 1.0 K/uL   Eosinophils Absolute 0.3 0.0 - 0.7 K/uL   Basophils Absolute 0.0 0.0 - 0.1 K/uL      Assessment & Plan:   Problem List Items Addressed This Visit    Weak urinary stream    H/o peyronie's as well as BPH per  prior PCP records. Exam today without significant prostate enlargement.  Given endorsed weakening of stream and incomplete emptying and splitting of stream, will refer to urology for further eval. Reviewed last month's UA (trace blood otherwise normal).       Relevant Orders   Ambulatory referral to Urology   Prediabetes    Reviewed with patient, will trial lower glumetza dose.      OSA (obstructive sleep apnea)    Doesn't use CPAP. Endorses restorative sleep. Tried nose mask and full face mask.       Obesity, Class I, BMI 30-34.9    Discussed healthy diet and lifestyle changes to affect sustainable weight loss.      Left shoulder pain    Will consider referral to PT and let us know if desires this.      Left medial knee pain    Will consider referral to PT and let us know if desires this.      HLD (hyperlipidemia)    LDL mildly elevated for diabetes. Discussed with patient. Low chol handout provided today.      Health maintenance examination - Primary    Preventative protocols reviewed and updated unless pt declined. Discussed healthy diet and lifestyle.       Gout    Stable on allopurinol 100mg  daily.       Other Visit Diagnoses    Special screening for  malignant neoplasms, colon        Relevant Orders    Ambulatory referral to Gastroenterology    History of colonic polyps        Relevant Orders    Ambulatory referral to Gastroenterology        Follow up plan: Return in about 6 months (around 09/28/2015), or as needed, for follow up visit.

## 2015-03-28 NOTE — Assessment & Plan Note (Signed)
H/o peyronie's as well as BPH per prior PCP records. Exam today without significant prostate enlargement.  Given endorsed weakening of stream and incomplete emptying and splitting of stream, will refer to urology for further eval. Reviewed last month's UA (trace blood otherwise normal).

## 2015-03-28 NOTE — Assessment & Plan Note (Signed)
Will consider referral to PT and let us know if desires this.

## 2015-03-28 NOTE — Addendum Note (Signed)
Addended by: [name] on: 03/28/2015 11:37 AM   Modules accepted: Orders, SmartSet

## 2015-03-28 NOTE — Patient Instructions (Addendum)
Tdap today. Call your insurance about the shingles shot to see if it is covered or how much it would cost and where is cheaper (here or pharmacy).  If you want to receive here, call for nurse visit.  Consider b complex vitamin for memory - continue reading and memory games/word puzzles.  We will call you to schedule colonoscopy. We will refer you to urologist for further evaluation. Increase water intake to help control kidney stones. Goal clear or light urine.  Good to see you today, call us with questions. Return in 4 months for follow up visit, prior fasting for labs. Low cholesterol handout provided today.  Health Maintenance, Male A healthy lifestyle and preventative care can promote health and wellness.  Maintain regular health, dental, and eye exams.  Eat a healthy diet. Foods like vegetables, fruits, whole grains, low-fat dairy products, and lean protein foods contain the nutrients you need and are low in calories. Decrease your intake of foods high in solid fats, added sugars, and salt. Get information about a proper diet from your health care provider, if necessary.  Regular physical exercise is one of the most important things you can do for your health. Most adults should get at least 150 minutes of moderate-intensity exercise (any activity that increases your heart rate and causes you to sweat) each week. In addition, most adults need muscle-strengthening exercises on 2 or more days a week.   Maintain a healthy weight. The body mass index (BMI) is a screening tool to identify possible weight problems. It provides an estimate of body fat based on height and weight. Your health care provider can find your BMI and can help you achieve or maintain a healthy weight. For males 20 years and older:  A BMI below 18.5 is considered underweight.  A BMI of 18.5 to 24.9 is normal.  A BMI of 25 to 29.9 is considered overweight.  A BMI of 30 and above is considered obese.  Maintain normal  blood lipids and cholesterol by exercising and minimizing your intake of saturated fat. Eat a balanced diet with plenty of fruits and vegetables. Blood tests for lipids and cholesterol should begin at age 50 and be repeated every 5 years. If your lipid or cholesterol levels are high, you are over age 1, or you are at high risk for heart disease, you may need your cholesterol levels checked more frequently.Ongoing high lipid and cholesterol levels should be treated with medicines if diet and exercise are not working.  If you smoke, find out from your health care provider how to quit. If you do not use tobacco, do not start.  Lung cancer screening is recommended for adults aged 16-80 years who are at high risk for developing lung cancer because of a history of smoking. A yearly low-dose CT scan of the lungs is recommended for people who have at least a 30-pack-year history of smoking and are current smokers or have quit within the past 15 years. A pack year of smoking is smoking an average of 1 pack of cigarettes a day for 1 year (for example, a 30-pack-year history of smoking could mean smoking 1 pack a day for 30 years or 2 packs a day for 15 years). Yearly screening should continue until the smoker has stopped smoking for at least 15 years. Yearly screening should be stopped for people who develop a health problem that would prevent them from having lung cancer treatment.  If you choose to drink alcohol, do not have  more than 2 drinks per day. One drink is considered to be 12 oz (360 mL) of beer, 5 oz (150 mL) of wine, or 1.5 oz (45 mL) of liquor.  Avoid the use of street drugs. Do not share needles with anyone. Ask for help if you need support or instructions about stopping the use of drugs.  High blood pressure causes heart disease and increases the risk of stroke. High blood pressure is more likely to develop in:  People who have blood pressure in the end of the normal range (100-139/85-89 mm  Hg).  People who are overweight or obese.  People who are African American.  If you are 27-62 years of age, have your blood pressure checked every 3-5 years. If you are 59 years of age or older, have your blood pressure checked every year. You should have your blood pressure measured twice--once when you are at a hospital or clinic, and once when you are not at a hospital or clinic. Record the average of the two measurements. To check your blood pressure when you are not at a hospital or clinic, you can use:  An automated blood pressure machine at a pharmacy.  A home blood pressure monitor.  If you are 79-27 years old, ask your health care provider if you should take aspirin to prevent heart disease.  Diabetes screening involves taking a blood sample to check your fasting blood sugar level. This should be done once every 3 years after age 86 if you are at a normal weight and without risk factors for diabetes. Testing should be considered at a younger age or be carried out more frequently if you are overweight and have at least 1 risk factor for diabetes.  Colorectal cancer can be detected and often prevented. Most routine colorectal cancer screening begins at the age of 41 and continues through age 63. However, your health care provider may recommend screening at an earlier age if you have risk factors for colon cancer. On a yearly basis, your health care provider may provide home test kits to check for hidden blood in the stool. A small camera at the end of a tube may be used to directly examine the colon (sigmoidoscopy or colonoscopy) to detect the earliest forms of colorectal cancer. Talk to your health care provider about this at age 15 when routine screening begins. A direct exam of the colon should be repeated every 5-10 years through age 62, unless early forms of precancerous polyps or small growths are found.  People who are at an increased risk for hepatitis B should be screened for this  virus. You are considered at high risk for hepatitis B if:  You were born in a country where hepatitis B occurs often. Talk with your health care provider about which countries are considered high risk.  Your parents were born in a high-risk country and you have not received a shot to protect against hepatitis B (hepatitis B vaccine).  You have HIV or AIDS.  You use needles to inject street drugs.  You live with, or have sex with, someone who has hepatitis B.  You are a man who has sex with other men (MSM).  You get hemodialysis treatment.  You take certain medicines for conditions like cancer, organ transplantation, and autoimmune conditions.  Hepatitis C blood testing is recommended for all people born from [birth year] through 1966 and any individual with known risk factors for hepatitis C.  Healthy men should no longer receive prostate-specific antigen (  PSA) blood tests as part of routine cancer screening. Talk to your health care provider about prostate cancer screening.  Testicular cancer screening is not recommended for adolescents or adult males who have no symptoms. Screening includes self-exam, a health care provider exam, and other screening tests. Consult with your health care provider about any symptoms you have or any concerns you have about testicular cancer.  Practice safe sex. Use condoms and avoid high-risk sexual practices to reduce the spread of sexually transmitted infections (STIs).  You should be screened for STIs, including gonorrhea and chlamydia if:  You are sexually active and are younger than 24 years.  You are older than 24 years, and your health care provider tells you that you are at risk for this type of infection.  Your sexual activity has changed since you were last screened, and you are at an increased risk for chlamydia or gonorrhea. Ask your health care provider if you are at risk.  If you are at risk of being infected with HIV, it is recommended that  you take a prescription medicine daily to prevent HIV infection. This is called pre-exposure prophylaxis (PrEP). You are considered at risk if:  You are a man who has sex with other men (MSM).  You are a heterosexual man who is sexually active with multiple partners.  You take drugs by injection.  You are sexually active with a partner who has HIV.  Talk with your health care provider about whether you are at high risk of being infected with HIV. If you choose to begin PrEP, you should first be tested for HIV. You should then be tested every 3 months for as long as you are taking PrEP.  Use sunscreen. Apply sunscreen liberally and repeatedly throughout the day. You should seek shade when your shadow is shorter than you. Protect yourself by wearing long sleeves, pants, a wide-brimmed hat, and sunglasses year round whenever you are outdoors.  Tell your health care provider of new moles or changes in moles, especially if there is a change in shape or color. Also, tell your health care provider if a mole is larger than the size of a pencil eraser.  A one-time screening for abdominal aortic aneurysm (AAA) and surgical repair of large AAAs by ultrasound is recommended for men aged 65-75 years who are current or former smokers.  Stay current with your vaccines (immunizations).   This information is not intended to replace advice given to you by your health care provider. Make sure you discuss any questions you have with your health care provider.   Document Released: 06/29/2007 Document Revised: 01/21/2014 Document Reviewed: 05/28/2010 Elsevier Interactive Patient Education [employer].

## 2015-03-28 NOTE — Assessment & Plan Note (Signed)
Preventative protocols reviewed and updated unless pt declined. Discussed healthy diet and lifestyle.  

## 2015-03-28 NOTE — Addendum Note (Signed)
Addended by: Royann Shivers A on: 03/28/2015 12:30 PM   Modules accepted: Orders

## 2015-03-28 NOTE — Assessment & Plan Note (Signed)
LDL mildly elevated for diabetes. Discussed with patient. Low chol handout provided today.

## 2015-03-28 NOTE — Assessment & Plan Note (Signed)
Discussed healthy diet and lifestyle changes to affect sustainable weight loss  

## 2015-03-28 NOTE — Assessment & Plan Note (Addendum)
Doesn't use CPAP. Endorses restorative sleep. Tried nose mask and full face mask.

## 2015-03-28 NOTE — Assessment & Plan Note (Signed)
Reviewed with patient, will trial lower glumetza dose.

## 2015-03-28 NOTE — Progress Notes (Signed)
Pre visit review using our clinic review tool, if applicable. No additional management support is needed unless otherwise documented below in the visit note. 

## 2015-03-28 NOTE — Assessment & Plan Note (Signed)
Stable on allopurinol 100mg daily. 

## 2015-04-02 ENCOUNTER — Other Ambulatory Visit: Payer: Self-pay | Admitting: Primary Care

## 2015-04-03 ENCOUNTER — Ambulatory Visit (INDEPENDENT_AMBULATORY_CARE_PROVIDER_SITE_OTHER): Payer: Federal, State, Local not specified - PPO | Admitting: Urology

## 2015-04-03 ENCOUNTER — Encounter: Payer: Self-pay | Admitting: Urology

## 2015-04-03 VITALS — BP 126/77 | HR 101 | Ht 71.0 in | Wt 229.9 lb

## 2015-04-03 DIAGNOSIS — N486 Induration penis plastica: Secondary | ICD-10-CM

## 2015-04-03 DIAGNOSIS — R3912 Poor urinary stream: Secondary | ICD-10-CM | POA: Diagnosis not present

## 2015-04-03 DIAGNOSIS — N200 Calculus of kidney: Secondary | ICD-10-CM | POA: Diagnosis not present

## 2015-04-03 LAB — URINALYSIS, COMPLETE
Bilirubin, UA: NEGATIVE
Glucose, UA: NEGATIVE
Ketones, UA: NEGATIVE
Leukocytes, UA: NEGATIVE
Nitrite, UA: NEGATIVE
Protein, UA: NEGATIVE
Specific Gravity, UA: 1.01 (ref 1.005–1.030)
Urobilinogen, Ur: 0.2 mg/dL (ref 0.2–1.0)
pH, UA: 5 (ref 5.0–7.5)

## 2015-04-03 LAB — MICROSCOPIC EXAMINATION: Epithelial Cells (non renal): NONE SEEN /hpf (ref 0–10)

## 2015-04-03 LAB — BLADDER SCAN AMB NON-IMAGING: Scan Result: 22

## 2015-04-03 MED ORDER — TRAMADOL HCL 50 MG PO TABS: 50.0000 mg | ORAL_TABLET | Freq: Four times a day (QID) | ORAL | Status: DC | PRN

## 2015-04-03 NOTE — Progress Notes (Addendum)
04/03/2015 4:34 PM   Richard Case 1955/08/23 [id number]  Referring provider: [name], MD [address] [place], [place] [address]  Chief Complaint  Patient presents with  . Nephrolithiasis     referred by  Richard Case  . weak stream    HPI: Patient is a 60 year old Caucasian male a history of nephrolithiasis who is referred by his primary care physician office, Dr. Danise Case, for right renal colic.  Patient states that about 1 month ago he started experiencing severe nausea and was seen by Richard Case a Designer, jewellery at Dr. Danise Case office.   A abdominal x-ray at that time noted probable stones in the distal right ureter and at the right UPJ. No definite left-sided ureteral stones were noted.  Patient's nausea abated and he decided on managing the stones conservatively.  This week he started experiencing nausea and vomiting.  He felt so badly yesterday that he stayed in the bed.  He has been taking tamsulosin, tramadol and phenergan to manage the pain.  He states the pain starts in the right lower quadrant and radiates to the right flank.  He describes it as colicky.  He is having associated intermittency and urinary hesitancy. He denies any gross hematuria.  He has not passed a distinct fragment.     He has a history of nephrolithiasis.  He spontaneously passed a stone 5 years ago in [place], [place].  His stone composition is unknown at this time.    He also evidence to having difficulty with erections. He states that his penis actually has a greater than 90 bend what it is erect.  It is not painful.  He is not sexually active at this time.    PMH: Past Medical History  Diagnosis Date  . Gout   . GERD (gastroesophageal reflux disease)   . Seasonal allergic rhinitis   . Heart murmur longstanding  . Elevated blood pressure (not hypertension)   . History of kidney stones 2008, 2017  . Benign colon polyp ?2014    hyperplastic  . Erectile  dysfunction   . Obesity, Class I, BMI 30-34.9   . 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 BPH (benign prostatic hypertrophy)     per prior PCP records  . HLD (hyperlipidemia)   . Prediabetes   . Male erectile dysfunction   . OSA (obstructive sleep apnea)     per prior pcp records - improved with weight loss  . Rosacea     per prior pcp records  . Polycythemia     ?OSA related  . Peyronie disease     per prior pcp records  . Atrial ectopy 2011    improved with CPAP, documented by holter  . ADD (attention deficit disorder) 2011    no records of workup; strattera caused urinary retention, not interested in habit forming medication  . History of pneumonia 2009    Surgical History: Past Surgical History  Procedure Laterality Date  . Mohs surgery  2015    SCC of nose  . Tonsillectomy    . Colonoscopy  2009    rec rpt 5 yrs per prior PCP records but no actual report    Home Medications:    Medication List       This list is accurate as of: 04/03/15  4:34 PM.  Always use your most recent med list.  allopurinol 300 MG tablet  Commonly known as:  ZYLOPRIM  Take 300 mg by mouth daily.     metFORMIN 500 MG (MOD) 24 hr tablet  Commonly known as:  GLUMETZA  Take 1 tablet (500 mg total) by mouth daily with supper.     promethazine 25 MG suppository  Commonly known as:  PHENERGAN  PLACE 1 SUPPOSITORY (25 MG TOTAL) RECTALLY EVERY 6 (SIX) HOURS AS NEEDED FOR NAUSEA OR VOMITING.     tamsulosin 0.4 MG Caps capsule  Commonly known as:  FLOMAX  TAKE 1 CAPSULE (0.4 MG TOTAL) BY MOUTH DAILY.     traMADol 50 MG tablet  Commonly known as:  ULTRAM  Take 1 tablet (50 mg total) by mouth every 6 (six) hours as needed.        Allergies:  Allergies  Allergen Reactions  . Strattera [Atomoxetine Hcl] Other (See Comments)    Urinary retention    Family History: Family History  Problem Relation Age of Onset  . Cancer Father 81    prostate  .  Cancer Mother     lung  . Lung disease Mother   . CAD Father     possibly?  . Stroke Neg Hx   . Diabetes Neg Hx   . Kidney disease Neg Hx     Social History:  reports that he has never smoked. He has never used smokeless tobacco. He reports that he does not drink alcohol or use illicit drugs.  ROS: UROLOGY Frequent Urination?: No Hard to postpone urination?: No Burning/pain with urination?: No Get up at night to urinate?: No Leakage of urine?: Yes Urine stream starts and stops?: Yes Trouble starting stream?: Yes Do you have to strain to urinate?: No Blood in urine?: No Urinary tract infection?: No Sexually transmitted disease?: No Injury to kidneys or bladder?: No Painful intercourse?: No Weak stream?: No Erection problems?: Yes Penile pain?: No  Gastrointestinal Nausea?: Yes Vomiting?: Yes Indigestion/heartburn?: No Diarrhea?: No Constipation?: Yes  Constitutional Fever: No Night sweats?: No Weight loss?: No Fatigue?: No  Skin Skin rash/lesions?: No Itching?: No  Eyes Blurred vision?: No Double vision?: No  Ears/Nose/Throat Sore throat?: No Sinus problems?: No  Hematologic/Lymphatic Swollen glands?: No Easy bruising?: No  Cardiovascular Leg swelling?: No Chest pain?: No  Respiratory Cough?: No Shortness of breath?: No  Endocrine Excessive thirst?: Yes  Musculoskeletal Back pain?: No Joint pain?: Yes  Neurological Headaches?: No Dizziness?: No  Psychologic Depression?: No Anxiety?: No  Physical Exam: BP 126/77 mmHg  Pulse 101  Ht 5\' 11"  (1.803 m)  Wt 229 lb 14.4 oz (104.282 kg)  BMI 32.08 kg/m2  Constitutional: Well nourished. Alert and oriented, No acute distress. HEENT:  AT, moist mucus membranes. Trachea midline, no masses. Cardiovascular: No clubbing, cyanosis, or edema. Respiratory: Normal respiratory effort, no increased work of breathing. GI: Abdomen is soft, non tender, non distended, no abdominal masses. Liver  and spleen not palpable.  No hernias appreciated.  Stool sample for occult testing is not indicated.   GU: No CVA tenderness.  No bladder fullness or masses.   Skin: No rashes, bruises or suspicious lesions. Lymph: No cervical or inguinal adenopathy. Neurologic: Grossly intact, no focal deficits, moving all 4 extremities. Psychiatric: Normal mood and affect.  Laboratory Data: Lab Results  Component Value Date   WBC 6.8 03/22/2015   HGB 15.7 03/22/2015   HCT 45.3 03/22/2015   MCV 90.5 03/22/2015   PLT 210.0 03/22/2015   Lab Results  Component Value Date  CREATININE 1.12 03/22/2015   Lab Results  Component Value Date   PSA 1.16 03/22/2015   PSA 1.010 05/04/2013   Lab Results  Component Value Date   HGBA1C 5.8 01/27/2015   Lab Results  Component Value Date   TSH 1.32 05/04/2013      Component Value Date/Time   CHOL 204* 01/27/2015 1032   CHOL 187 05/04/2013   HDL 40.90 01/27/2015 1032   CHOLHDL 5 01/27/2015 1032   VLDL 22.2 01/27/2015 1032   LDLCALC 141* 01/27/2015 1032   LDLCALC 123 05/04/2013    Lab Results  Component Value Date   AST 19 03/22/2015   Lab Results  Component Value Date   ALT 21 03/22/2015     Urinalysis Results for orders placed or performed in visit on 04/03/15  Microscopic Examination  Result Value Ref Range   WBC, UA 6-10 (A) 0 -  5 /hpf   RBC, UA 0-2 0 -  2 /hpf   Epithelial Cells (non renal) None seen 0 - 10 /hpf   Mucus, UA Present (A) Not Estab.   Bacteria, UA Few (A) None seen/Few  Urinalysis, Complete  Result Value Ref Range   Specific Gravity, UA 1.010 1.005 - 1.030   pH, UA 5.0 5.0 - 7.5   Color, UA Yellow Yellow   Appearance Ur Clear Clear   Leukocytes, UA Negative Negative   Protein, UA Negative Negative/Trace   Glucose, UA Negative Negative   Ketones, UA Negative Negative   RBC, UA 2+ (A) Negative   Bilirubin, UA Negative Negative   Urobilinogen, Ur 0.2 0.2 - 1.0 mg/dL   Nitrite, UA Negative Negative    Microscopic Examination See below:   BLADDER SCAN AMB NON-IMAGING  Result Value Ref Range   Scan Result 22    Pertinent Imaging: Results for ROMMY, ALFORD (MRN [id number]) as of 04/04/2015 10:08  Ref. Range 04/03/2015 15:49  Scan Result Unknown 22   CLINICAL DATA: Right flank pain radiating to the groin, small amount of blood in the urinalysis  EXAM: ABDOMEN - 1 VIEW  COMPARISON: None in PACs  FINDINGS: There is an approximately 5 x 6 mm diameter stone that projects in the region of the ureteral pelvic junction on the right. More inferiorly on the right in the distal third of the ureter there is an approximately 2 x 3 mm irregular radiodensity consistent with a stone. No definite stones are observed on the left.  IMPRESSION: Probable stones in the distal right ureter and at the right UPJ. No definite left-sided ureteral stones.   Electronically Signed  By: David Martinique M.D.  On: 02/21/2015 11:02   Assessment & Plan:    1. Kidney stones:   Patient with right renal colic.  KUB taken on 02/21/2015 at PCP's office noted probable stone in the right ureter.  We will obtain a CT stone protocol for further evaluation.  He has tamsulosin, Phenergan and tramadol on hand for renal colic symptoms.  - Urinalysis, Complete  2. Weak urinary stream:   Patient stated his urinary symptoms started with renal colic.  We will reassess when she has been evaluated and treated for his urinary stones.  - BLADDER SCAN AMB NON-IMAGING  3. Peyronie's disease:   Patient describes a significant bend in his penis.  It does not cause discomfort at this time.  He is not sexually active at this time.  He did make him aware of the Xiaflex injections.  He would like to table this issue for  now.   Return for CT stone study report .  These notes generated with voice recognition software. I apologize for typographical errors.  Royden Purl  [address]  [place], [place], [place] [address] [phone]  Addendum:  Patient's CT scan noted a right UVJ (5 mm x 4 mm x 4 mm) stone causing moderate right hydronephrosis and moderate right hydroureter.  Patient would like to undergo right ESWL for definitive treatment of the stone.

## 2015-04-04 ENCOUNTER — Telehealth: Payer: Self-pay | Admitting: *Deleted

## 2015-04-04 ENCOUNTER — Other Ambulatory Visit: Payer: Self-pay | Admitting: Urology

## 2015-04-04 ENCOUNTER — Telehealth: Payer: Self-pay | Admitting: Urology

## 2015-04-04 ENCOUNTER — Ambulatory Visit
Admission: RE | Admit: 2015-04-04 | Discharge: 2015-04-04 | Disposition: A | Discharge status: Home / Self Care | Payer: Federal, State, Local not specified - PPO | Source: Ambulatory Visit | Attending: Urology | Admitting: Urology

## 2015-04-04 DIAGNOSIS — N200 Calculus of kidney: Principal | ICD-10-CM

## 2015-04-04 DIAGNOSIS — M47896 Other spondylosis, lumbar region: Secondary | ICD-10-CM | POA: Diagnosis not present

## 2015-04-04 DIAGNOSIS — Z87442 Personal history of urinary calculi: Secondary | ICD-10-CM | POA: Insufficient documentation

## 2015-04-04 DIAGNOSIS — M5136 Other intervertebral disc degeneration, lumbar region: Secondary | ICD-10-CM | POA: Insufficient documentation

## 2015-04-04 DIAGNOSIS — N132 Hydronephrosis with renal and ureteral calculous obstruction: Secondary | ICD-10-CM | POA: Diagnosis not present

## 2015-04-04 DIAGNOSIS — N201 Calculus of ureter: Secondary | ICD-10-CM | POA: Diagnosis not present

## 2015-04-04 DIAGNOSIS — N486 Induration penis plastica: Secondary | ICD-10-CM | POA: Insufficient documentation

## 2015-04-04 DIAGNOSIS — K5730 Diverticulosis of large intestine without perforation or abscess without bleeding: Secondary | ICD-10-CM | POA: Diagnosis not present

## 2015-04-04 NOTE — Telephone Encounter (Signed)
Spoke with patient per Richard Case to let him know we have his CT results back and Richard Case will call him and talk to him about them after she finishes her clinic today. Patient states ok and thanks for calling and he will be waiting on her call.

## 2015-04-04 NOTE — Telephone Encounter (Signed)
Electronically refill request for   tamsulosin (FLOMAX) 0.4 MG CAPS capsule   TAKE 1 CAPSULE (0.4 MG TOTAL) BY MOUTH DAILY.  Last prescribed by Anda Kraft on 02/21/2015. Dr Gutierrez's patient. Last seen on 03/28/2015.

## 2015-04-04 NOTE — Telephone Encounter (Signed)
Patient contacted our after-hours service due to increasing renal colic pain.  Contacted the patient and the pain is manageable at this time.  He would like [place] to expedite the CT scan.  His insurance requires a phone conversation versus approval online, so once the referral center is open this morning we will immediately get to work on getting his CT scan scheduled.

## 2015-04-06 ENCOUNTER — Ambulatory Visit
Admission: RE | Admit: 2015-04-06 | Discharge: 2015-04-06 | Disposition: A | Discharge status: Home / Self Care | Payer: Federal, State, Local not specified - PPO | Source: Ambulatory Visit | Attending: Urology | Admitting: Urology

## 2015-04-06 ENCOUNTER — Encounter: Payer: Self-pay | Admitting: *Deleted

## 2015-04-06 ENCOUNTER — Encounter
Admission: RE | Disposition: A | Discharge status: Home / Self Care | Payer: Self-pay | Source: Ambulatory Visit | Attending: Urology

## 2015-04-06 ENCOUNTER — Ambulatory Visit: Payer: Federal, State, Local not specified - PPO

## 2015-04-06 DIAGNOSIS — E119 Type 2 diabetes mellitus without complications: Secondary | ICD-10-CM | POA: Diagnosis not present

## 2015-04-06 DIAGNOSIS — N201 Calculus of ureter: Secondary | ICD-10-CM | POA: Insufficient documentation

## 2015-04-06 DIAGNOSIS — I10 Essential (primary) hypertension: Secondary | ICD-10-CM | POA: Insufficient documentation

## 2015-04-06 DIAGNOSIS — G4730 Sleep apnea, unspecified: Secondary | ICD-10-CM | POA: Insufficient documentation

## 2015-04-06 DIAGNOSIS — N200 Calculus of kidney: Secondary | ICD-10-CM

## 2015-04-06 HISTORY — PX: EXTRACORPOREAL SHOCK WAVE LITHOTRIPSY: SHX1557

## 2015-04-06 LAB — GLUCOSE, CAPILLARY: GLUCOSE-CAPILLARY: 125 mg/dL — AB (ref 65–99)

## 2015-04-06 SURGERY — LITHOTRIPSY, ESWL
Anesthesia: Moderate Sedation | Laterality: Right

## 2015-04-06 MED ORDER — DIPHENHYDRAMINE HCL 25 MG PO CAPS
25.0000 mg | ORAL_CAPSULE | ORAL | Status: AC
  Administered 2015-04-06: 25 mg via ORAL

## 2015-04-06 MED ORDER — DIAZEPAM 5 MG PO TABS
10.0000 mg | ORAL_TABLET | ORAL | Status: AC
  Administered 2015-04-06: 10 mg via ORAL

## 2015-04-06 MED ORDER — DIAZEPAM 5 MG PO TABS
ORAL_TABLET | ORAL | Status: AC
  Filled 2015-04-06: qty 2

## 2015-04-06 MED ORDER — CIPROFLOXACIN HCL 500 MG PO TABS
500.0000 mg | ORAL_TABLET | ORAL | Status: AC
Start: 2015-04-06 — End: 2015-04-06
  Administered 2015-04-06: 500 mg via ORAL

## 2015-04-06 MED ORDER — CIPROFLOXACIN HCL 500 MG PO TABS
ORAL_TABLET | ORAL | Status: AC
  Filled 2015-04-06: qty 1

## 2015-04-06 MED ORDER — DIPHENHYDRAMINE HCL 25 MG PO CAPS
ORAL_CAPSULE | ORAL | Status: AC
  Filled 2015-04-06: qty 1

## 2015-04-06 MED ORDER — DEXTROSE-NACL 5-0.45 % IV SOLN
INTRAVENOUS | Status: DC
Start: 2015-04-06 — End: 2015-04-06
  Administered 2015-04-06: 13:00:00 via INTRAVENOUS

## 2015-04-06 NOTE — Discharge Instructions (Signed)
AMBULATORY SURGERY  DISCHARGE INSTRUCTIONS   1) The drugs that you were given will stay in your system until tomorrow so for the next 24 hours you should not:  A) Drive an automobile B) Make any legal decisions C) Drink any alcoholic beverage   2) You may resume regular meals tomorrow.  Today it is better to start with liquids and gradually work up to solid foods.  You may eat anything you prefer, but it is better to start with liquids, then soup and crackers, and gradually work up to solid foods.   3) Please notify your doctor immediately if you have any unusual bleeding, trouble breathing, redness and pain at the surgery site, drainage, fever, or pain not relieved by medication.    4) Additional Instructions:  Dietary Guidelines to Help Prevent Kidney Stones Your risk of kidney stones can be decreased by adjusting the foods you eat. The most important thing you can do is drink enough fluid. You should drink enough fluid to keep your urine clear or pale yellow. The following guidelines provide specific information for the type of kidney stone you have had. GUIDELINES ACCORDING TO TYPE OF KIDNEY STONE Calcium Oxalate Kidney Stones  Reduce the amount of salt you eat. Foods that have a lot of salt cause your body to release excess calcium into your urine. The excess calcium can combine with a substance called oxalate to form kidney stones.  Reduce the amount of animal protein you eat if the amount you eat is excessive. Animal protein causes your body to release excess calcium into your urine. Ask your dietitian how much protein from animal sources you should be eating.  Avoid foods that are high in oxalates. If you take vitamins, they should have less than 500 mg of vitamin C. Your body turns vitamin C into oxalates. You do not need to avoid fruits and vegetables high in vitamin C. Calcium Phosphate Kidney Stones  Reduce the amount of salt you eat to help prevent the release of excess  calcium into your urine.  Reduce the amount of animal protein you eat if the amount you eat is excessive. Animal protein causes your body to release excess calcium into your urine. Ask your dietitian how much protein from animal sources you should be eating.  Get enough calcium from food or take a calcium supplement (ask your dietitian for recommendations). Food sources of calcium that do not increase your risk of kidney stones include:  Broccoli.  Dairy products, such as cheese and yogurt.  Pudding. Uric Acid Kidney Stones  Do not have more than 6 oz of animal protein per day. FOOD SOURCES Animal Protein Sources  Meat (all types).  Poultry.  Eggs.  Fish, seafood. Foods High in [employer] seasonings.  Soy sauce.  Teriyaki sauce.  Cured and processed meats.  Salted crackers and snack foods.  Fast food.  Canned soups and most canned foods. Foods High in Oxalates  Grains:  Amaranth.  Barley.  Grits.  Wheat germ.  Bran.  Buckwheat flour.  All bran cereals.  Pretzels.  Whole wheat bread.  Vegetables:  Beans (wax).  Beets and beet greens.  Collard greens.  Eggplant.  [place].  Leeks.  Okra.  Parsley.  R[place]abagas.  Spinach.  Swiss chard.  Tomato paste.  Fried potatoes.  Sweet potatoes.  Fruits:  Red currants.  Figs.  Kiwi.  Rhubarb.  Meat and Other Protein Sources:  Beans (dried).  Soy burgers and other soybean products.  Miso.  Nuts (peanuts, almonds, pecans, cashews, hazelnuts).  Nut butters.  Sesame seeds and tahini (paste made of sesame seeds).  Poppy seeds.  Beverages:  Chocolate drink mixes.  Soy milk.  Instant iced tea.  Juices made from high-oxalate fruits or vegetables.  Other:  Carob.  Chocolate.  Fruitcake.  Marmalades.   This information is not intended to replace advice given to you by your health care provider. Make sure you discuss any questions you have with your health  care provider.   Document Released: 04/27/2010 Document Revised: 01/05/2013 Document Reviewed: 11/27/2012 Elsevier Interactive Patient Education 2016 [place].  Please contact your physician with any problems or Same Day Surgery at [phone], Monday through Friday 6 am to 4 pm, or [place] at [facility] number at [phone].

## 2015-04-11 ENCOUNTER — Ambulatory Visit: Payer: Self-pay

## 2015-04-18 ENCOUNTER — Ambulatory Visit
Admission: RE | Admit: 2015-04-18 | Discharge: 2015-04-18 | Disposition: A | Discharge status: Home / Self Care | Payer: Federal, State, Local not specified - PPO | Source: Ambulatory Visit | Attending: Urology | Admitting: Urology

## 2015-04-18 ENCOUNTER — Ambulatory Visit (INDEPENDENT_AMBULATORY_CARE_PROVIDER_SITE_OTHER): Payer: Federal, State, Local not specified - PPO | Admitting: Urology

## 2015-04-18 ENCOUNTER — Encounter: Payer: Self-pay | Admitting: Urology

## 2015-04-18 VITALS — BP 139/78 | HR 90 | Ht 71.0 in | Wt 230.8 lb

## 2015-04-18 DIAGNOSIS — N1330 Unspecified hydronephrosis: Secondary | ICD-10-CM | POA: Diagnosis not present

## 2015-04-18 DIAGNOSIS — Z87442 Personal history of urinary calculi: Secondary | ICD-10-CM | POA: Insufficient documentation

## 2015-04-18 DIAGNOSIS — N200 Calculus of kidney: Secondary | ICD-10-CM

## 2015-04-18 DIAGNOSIS — R3912 Poor urinary stream: Secondary | ICD-10-CM | POA: Diagnosis not present

## 2015-04-18 LAB — MICROSCOPIC EXAMINATION: Bacteria, UA: NONE SEEN

## 2015-04-18 LAB — URINALYSIS, COMPLETE
BILIRUBIN UA: NEGATIVE
GLUCOSE, UA: NEGATIVE
KETONES UA: NEGATIVE
Leukocytes, UA: NEGATIVE
Nitrite, UA: NEGATIVE
Protein, UA: NEGATIVE
RBC UA: NEGATIVE
Specific Gravity, UA: 1.02 (ref 1.005–1.030)
UUROB: 0.2 mg/dL (ref 0.2–1.0)
pH, UA: 5.5 (ref 5.0–7.5)

## 2015-04-18 MED ORDER — TAMSULOSIN HCL 0.4 MG PO CAPS: ORAL_CAPSULE | ORAL | Status: DC

## 2015-04-18 NOTE — Progress Notes (Signed)
10:40 AM   [name] 08-18-1955 [id number]  Referring provider: [name], MD [address] [place], [place] [address]  Chief Complaint  Patient presents with  . Follow-up    POST OP eswl    HPI: Patient is a 60 year old Caucasian male who is status post ESWL for a 5 mm right UVJ stone.  Background history Patient with a history of nephrolithiasis who is referred by his primary care physician office, [name], for right renal colic.  CT scan noted a right UVJ (5 mm x 4 mm x 4 mm) stone causing moderate right hydronephrosis and moderate right hydroureter.    Patient underwent right ESWL for definitive treatment of his UVJ stone.  Patient stated that immediately after he underwent ESWL, he did not experience any pain.  Then 3-4 days later, he experienced right-sided pain that was quite intense. He then passed a fragment which he brings with him today.    His UA today had 0-5 RBCs per prior field.  KUB taken today demonstrates a possible 1 mm fragment.    Patient would like to stay on the tamsulosin as it improved his urinary symptoms while he was on the medication.  He has a history of nephrolithiasis.  He spontaneously passed a stone 5 years ago in [place], [place].  His stone composition is unknown at this time.    He also evidence to having difficulty with erections. He states that his penis actually has a greater than 90 bend what it is erect.  It is not painful.  He is not sexually active at this time.    PMH: Past Medical History  Diagnosis Date  . Gout   . GERD (gastroesophageal reflux disease)   . Seasonal allergic rhinitis   . Heart murmur longstanding  . Elevated blood pressure (not hypertension)   . History of kidney stones 2008, 2017  . Benign colon polyp ?2014    hyperplastic  . Erectile dysfunction   . Obesity, Class I, BMI 30-34.9   . 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 BPH (benign prostatic hypertrophy)    per prior PCP records  . HLD (hyperlipidemia)   . Prediabetes   . Male erectile dysfunction   . OSA (obstructive sleep apnea)     per prior pcp records - improved with weight loss  . Rosacea     per prior pcp records  . Polycythemia     ?OSA related  . Peyronie disease     per prior pcp records  . Atrial ectopy 2011    improved with CPAP, documented by holter  . ADD (attention deficit disorder) 2011    no records of workup; strattera caused urinary retention, not interested in habit forming medication  . History of pneumonia 2009    Surgical History: Past Surgical History  Procedure Laterality Date  . Mohs surgery  2015    SCC of nose  . Tonsillectomy    . Colonoscopy  2009    rec rpt 5 yrs per prior PCP records but no actual report  . Extracorporeal shock wave lithotripsy Right 04/06/2015    Procedure: EXTRACORPOREAL SHOCK WAVE LITHOTRIPSY (ESWL);  Surgeon: Nickie Retort, MD;  Location: [facility] ORS;  Service: Urology;  Laterality: Right;    Home Medications:    Medication List       This list is accurate as of: 04/18/15 10:40 AM.  Always use your most recent  med list.               allopurinol 300 MG tablet  Commonly known as:  ZYLOPRIM  Take 300 mg by mouth daily.     ibuprofen 200 MG tablet  Commonly known as:  ADVIL,MOTRIN  Take 400 mg by mouth every 6 (six) hours as needed for moderate pain.     metFORMIN 500 MG (MOD) 24 hr tablet  Commonly known as:  GLUMETZA  Take 1 tablet (500 mg total) by mouth daily with supper.     oxyCODONE-acetaminophen 5-325 MG tablet  Commonly known as:  PERCOCET/ROXICET  Take 1 tablet by mouth every 4 (four) hours as needed. Reported on 04/18/2015     promethazine 25 MG suppository  Commonly known as:  PHENERGAN  Reported on 04/18/2015     tamsulosin 0.4 MG Caps capsule  Commonly known as:  FLOMAX  TAKE 1 CAPSULE (0.4 MG TOTAL) BY MOUTH DAILY.     traMADol 50 MG tablet  Commonly known as:  ULTRAM  Take 1 tablet (50 mg  total) by mouth every 6 (six) hours as needed.        Allergies:  Allergies  Allergen Reactions  . Strattera [Atomoxetine Hcl] Other (See Comments)    Urinary retention    Family History: Family History  Problem Relation Age of Onset  . Cancer Father 41    prostate  . Cancer Mother     lung  . Lung disease Mother   . CAD Father     possibly?  . Stroke Neg Hx   . Diabetes Neg Hx   . Kidney disease Neg Hx     Social History:  reports that he has never smoked. He has never used smokeless tobacco. He reports that he does not drink alcohol or use illicit drugs.  ROS: UROLOGY Frequent Urination?: No Hard to postpone urination?: No Burning/pain with urination?: No Get up at night to urinate?: No Leakage of urine?: No Urine stream starts and stops?: Yes Trouble starting stream?: Yes Do you have to strain to urinate?: No Blood in urine?: No Urinary tract infection?: No Sexually transmitted disease?: No Injury to kidneys or bladder?: No Painful intercourse?: No Weak stream?: No Erection problems?: Yes Penile pain?: No  Gastrointestinal Nausea?: No Vomiting?: No Indigestion/heartburn?: No Diarrhea?: No Constipation?: No  Constitutional Fever: No Night sweats?: No Weight loss?: No Fatigue?: No  Skin Skin rash/lesions?: No Itching?: No  Eyes Blurred vision?: No Double vision?: No  Ears/Nose/Throat Sore throat?: No Sinus problems?: No  Hematologic/Lymphatic Swollen glands?: No Easy bruising?: No  Cardiovascular Leg swelling?: No Chest pain?: No  Respiratory Cough?: No Shortness of breath?: No  Endocrine Excessive thirst?: No  Musculoskeletal Back pain?: No Joint pain?: No  Neurological Headaches?: No Dizziness?: No  Psychologic Depression?: No Anxiety?: No  Physical Exam: BP 139/78 mmHg  Pulse 90  Ht 5\' 11"  (1.803 m)  Wt 230 lb 12.8 oz (104.69 kg)  BMI 32.20 kg/m2  Constitutional: Well nourished. Alert and oriented, No acute  distress. HEENT: [place] AT, moist mucus membranes. Trachea midline, no masses. Cardiovascular: No clubbing, cyanosis, or edema. Respiratory: Normal respiratory effort, no increased work of breathing. Skin: No rashes, bruises or suspicious lesions. Lymph: No cervical or inguinal adenopathy. Neurologic: Grossly intact, no focal deficits, moving all 4 extremities. Psychiatric: Normal mood and affect.  Laboratory Data: Lab Results  Component Value Date   WBC 6.8 03/22/2015   HGB 15.7 03/22/2015   HCT 45.3 03/22/2015  MCV 90.5 03/22/2015   PLT 210.0 03/22/2015   Lab Results  Component Value Date   CREATININE 1.12 03/22/2015   Lab Results  Component Value Date   PSA 1.16 03/22/2015   PSA 1.010 05/04/2013   Lab Results  Component Value Date   HGBA1C 5.8 01/27/2015   Lab Results  Component Value Date   TSH 1.32 05/04/2013      Component Value Date/Time   CHOL 204* 01/27/2015 1032   CHOL 187 05/04/2013   HDL 40.90 01/27/2015 1032   CHOLHDL 5 01/27/2015 1032   VLDL 22.2 01/27/2015 1032   LDLCALC 141* 01/27/2015 1032   LDLCALC 123 05/04/2013    Lab Results  Component Value Date   AST 19 03/22/2015   Lab Results  Component Value Date   ALT 21 03/22/2015     Urinalysis Results for orders placed or performed in visit on 04/18/15  Microscopic Examination  Result Value Ref Range   WBC, UA 0-5 0 -  5 /hpf   RBC, UA 0-2 0 -  2 /hpf   Epithelial Cells (non renal) 0-10 0 - 10 /hpf   Renal Epithel, UA 0-10 (A) None seen /hpf   Bacteria, UA None seen None seen/Few  Urinalysis, Complete  Result Value Ref Range   Specific Gravity, UA 1.020 1.005 - 1.030   pH, UA 5.5 5.0 - 7.5   Color, UA Yellow Yellow   Appearance Ur Clear Clear   Leukocytes, UA Negative Negative   Protein, UA Negative Negative/Trace   Glucose, UA Negative Negative   Ketones, UA Negative Negative   RBC, UA Negative Negative   Bilirubin, UA Negative Negative   Urobilinogen, Ur 0.2 0.2 - 1.0 mg/dL    Nitrite, UA Negative Negative   Microscopic Examination See below:    Pertinent Imaging: CLINICAL DATA: Personal history of kidney stones. Lithotripsy 2 weeks ago. No current symptoms.  EXAM: ABDOMEN - 1 VIEW  COMPARISON: Previously noted calcifications at the right UPJ are no longer present. Distal fell calcifications are compatible with phleboliths.  FINDINGS: The previously noted calcifications at the right UPJ are no longer present. Distal pelvic calcifications remain. This may include the distal right ureteral stone on the right. No left-sided stones are present. The bowel gas pattern is unremarkable. Degenerative changes are again noted in the lower lumbar spine without change.  IMPRESSION: 1. Persistent calcifications within the anatomic pelvis. At least 1 of these stones remains concerning for distal ureteral stone third 2. Previously seen calcifications projected over the right renal shadow are no longer present.   Electronically Signed  By: San Morelle M.D.  On: 04/18/2015 12:01  Assessment & Plan:    1. Kidney stones:   Patient underwent ESWL for definitive treatment of his right UVJ stone on 2015-04-24.  He has passed a fragment. We will send that for analysis.  KUB taken today demonstrates a possible remaining fragment.  Patient is not having symptoms at this time. He would like to wait until he undergoes the ultrasound in 1 month before obtaining another KUB.  - Urinalysis, Complete  2. Weak urinary stream:   Patient would like to continue his tamsulosin 0.4 mg daily as it improved his urinary stream.  Refills are sent to his pharmacy.  - Urinalysis, Complete  3. Hydronephrosis:   Patient was found to have right hydronephrosis due to a right UVJ stone.  A RUS will be obtained in one month to ensure resolution of the hydronephrosis.  4. Peyronie's disease:  Patient describes a significant bend in his penis.  It does not cause discomfort at  this time.  He is not sexually active at this time.  He did make him aware of the Xiaflex injections.  He would like to table this issue for now.   Return in about 1 month (around 05/18/2015) for RUS report.  These notes generated with voice recognition software. I apologize for typographical errors.  Zara Council, [facility] [address] [place], [place] [address] [phone]

## 2015-04-19 DIAGNOSIS — N1330 Unspecified hydronephrosis: Secondary | ICD-10-CM | POA: Insufficient documentation

## 2015-04-20 ENCOUNTER — Telehealth: Payer: Self-pay | Admitting: Urology

## 2015-04-20 ENCOUNTER — Telehealth: Payer: Self-pay

## 2015-04-20 NOTE — Telephone Encounter (Signed)
Spoke with pt in reference to fragments in ureter. Pt stated that he spoke with Larene Beach this morning and they decided to wait until RUS at the end of the month.

## 2015-04-20 NOTE — Telephone Encounter (Signed)
Patient is going to have a RUS in one month.

## 2015-04-20 NOTE — Telephone Encounter (Signed)
-----   Message from Nori Riis, PA-C sent at 04/18/2015  2:15 PM EDT ----- There may be a tiny fragment left in the lower ureter.  Less than 2 mm.  I suggest a repeat x-ray next week.

## 2015-05-01 ENCOUNTER — Other Ambulatory Visit: Payer: Self-pay | Admitting: Urology

## 2015-05-12 ENCOUNTER — Ambulatory Visit: Payer: Federal, State, Local not specified - PPO

## 2015-05-18 ENCOUNTER — Encounter: Payer: Self-pay | Admitting: Urology

## 2015-05-18 ENCOUNTER — Ambulatory Visit: Payer: Federal, State, Local not specified - PPO | Admitting: Urology

## 2015-05-19 ENCOUNTER — Ambulatory Visit
Admission: RE | Admit: 2015-05-19 | Discharge: 2015-05-19 | Disposition: A | Discharge status: Home / Self Care | Payer: Federal, State, Local not specified - PPO | Source: Ambulatory Visit | Attending: Urology | Admitting: Urology

## 2015-05-19 DIAGNOSIS — N200 Calculus of kidney: Secondary | ICD-10-CM | POA: Diagnosis not present

## 2015-05-19 DIAGNOSIS — N1330 Unspecified hydronephrosis: Secondary | ICD-10-CM | POA: Diagnosis not present

## 2015-05-19 DIAGNOSIS — N281 Cyst of kidney, acquired: Secondary | ICD-10-CM | POA: Diagnosis not present

## 2015-05-19 DIAGNOSIS — N132 Hydronephrosis with renal and ureteral calculous obstruction: Secondary | ICD-10-CM | POA: Diagnosis not present

## 2015-05-28 ENCOUNTER — Encounter: Payer: Self-pay | Admitting: Family Medicine

## 2015-05-30 ENCOUNTER — Ambulatory Visit (INDEPENDENT_AMBULATORY_CARE_PROVIDER_SITE_OTHER): Payer: Federal, State, Local not specified - PPO | Admitting: Urology

## 2015-05-30 ENCOUNTER — Encounter: Payer: Self-pay | Admitting: Urology

## 2015-05-30 VITALS — BP 109/70 | HR 105 | Ht 71.0 in | Wt 230.4 lb

## 2015-05-30 DIAGNOSIS — Q6100 Congenital renal cyst, unspecified: Secondary | ICD-10-CM | POA: Diagnosis not present

## 2015-05-30 DIAGNOSIS — N1330 Unspecified hydronephrosis: Secondary | ICD-10-CM | POA: Diagnosis not present

## 2015-05-30 DIAGNOSIS — N200 Calculus of kidney: Secondary | ICD-10-CM | POA: Diagnosis not present

## 2015-05-30 DIAGNOSIS — N486 Induration penis plastica: Secondary | ICD-10-CM

## 2015-05-30 DIAGNOSIS — N281 Cyst of kidney, acquired: Secondary | ICD-10-CM

## 2015-05-30 DIAGNOSIS — R3912 Poor urinary stream: Secondary | ICD-10-CM

## 2015-05-30 NOTE — Progress Notes (Signed)
11:28 AM   [place] Case Oct 02, 1955 [id number]  Referring provider: [name], MD [address] [place], [place] [address]  Chief Complaint  Patient presents with  . Results    one month follow up Stone analysis    HPI: Patient is a 59 year old Caucasian male who presents today to discuss renal ultrasound results and his stone analysis.   Background history Patient with a history of nephrolithiasis who is referred by his primary care physician office, [name], for right renal colic.  CT scan noted a right UVJ (5 mm x 4 mm x 4 mm) stone causing moderate right hydronephrosis and moderate right hydroureter.  Patient underwent right ESWL for definitive treatment of his UVJ stone.  Patient stated that immediately after he underwent ESWL, he did not experience any pain.  Then 3-4 days later, he experienced right-sided pain that was quite intense. He then passed a fragment.  Follow up KUB demonstrates a possible 1 mm fragment.  He has a history of nephrolithiasis.  He spontaneously passed a stone 5 years ago in [place], [place].  Patient's stone analysis revealed a stone composition of 93% calcium oxalate monohydrate, 5% calcium oxalate dihydrate and 2% calcium phosphate carbonate.    His renal ultrasound from 05/19/2015 noted no residual hydronephrosis. No sonographic evidence of nephrolithiasis. Bilateral ureteral jets in the bladder. Minimally complex right renal cyst. Simple left renal cysts. No suspicious renal masses. Postvoid bladder residual of 82 cc, noting that the bladder was prominently distended pre-void. No bladder wall thickening.  I have reviewed the films with the patient.  Patient would like to stay on the tamsulosin as it improved his urinary symptoms while he was on the medication.   He also evidence to having difficulty with erections. He states that his penis actually has a greater than 90 bend what it is erect.  It is not painful.  He is not sexually  active at this time.  He would like more information on Xiaflex.    PMH: Past Medical History  Diagnosis Date  . Gout   . GERD (gastroesophageal reflux disease)   . Seasonal allergic rhinitis   . Heart murmur longstanding  . Elevated blood pressure (not hypertension)   . History of kidney stones 2008, 2017  . Benign colon polyp ?2014    hyperplastic  . Erectile dysfunction   . Obesity, Class I, BMI 30-34.9   . 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 BPH (benign prostatic hypertrophy)     per prior PCP records  . HLD (hyperlipidemia)   . Prediabetes   . Male erectile dysfunction   . OSA (obstructive sleep apnea) 2010    OSA with stabilization at CPAP 7cm  . Rosacea     per prior pcp records  . Polycythemia     ?OSA related  . Peyronie disease     per prior pcp records  . Atrial ectopy 2011    improved with CPAP, documented by holter  . ADD (attention deficit disorder) 2011    no records of workup; strattera caused urinary retention, not interested in habit forming medication  . History of pneumonia 2009    Surgical History: Past Surgical History  Procedure Laterality Date  . Mohs surgery  2015    SCC of nose  . Tonsillectomy    . Colonoscopy  2009    rec rpt 5 yrs per prior PCP records but no actual report  .  Extracorporeal shock wave lithotripsy Right 04/06/2015    Procedure: EXTRACORPOREAL SHOCK WAVE LITHOTRIPSY (ESWL);  Surgeon: Nickie Retort, MD;  Location: [facility] ORS;  Service: Urology;  Laterality: Right;  . Colonoscopy  03/2012    mild diverticulosis, rpt 5 yrs (Dr Hulan Saas in [place])    Home Medications:    Medication List       This list is accurate as of: 05/30/15 11:28 AM.  Always use your most recent med list.               allopurinol 300 MG tablet  Commonly known as:  ZYLOPRIM  Take 300 mg by mouth daily.     ibuprofen 200 MG tablet  Commonly known as:  ADVIL,MOTRIN  Take 400 mg by mouth every 6 (six) hours as  needed for moderate pain.     metFORMIN 500 MG (MOD) 24 hr tablet  Commonly known as:  GLUMETZA  Take 1 tablet (500 mg total) by mouth daily with supper.     NON FORMULARY  Patient takes an inhaler for coughing spells doesn't know name of medication     oxyCODONE-acetaminophen 5-325 MG tablet  Commonly known as:  PERCOCET/ROXICET  Take 1 tablet by mouth every 4 (four) hours as needed. Reported on 05/30/2015     promethazine 25 MG suppository  Commonly known as:  PHENERGAN  Reported on 05/30/2015     tamsulosin 0.4 MG Caps capsule  Commonly known as:  FLOMAX  TAKE 1 CAPSULE (0.4 MG TOTAL) BY MOUTH DAILY.     traMADol 50 MG tablet  Commonly known as:  ULTRAM  Take 1 tablet (50 mg total) by mouth every 6 (six) hours as needed.        Allergies:  Allergies  Allergen Reactions  . Strattera [Atomoxetine Hcl] Other (See Comments)    Urinary retention    Family History: Family History  Problem Relation Age of Onset  . Cancer Father 25    prostate  . Cancer Mother     lung  . Lung disease Mother   . CAD Father     possibly?  . Stroke Neg Hx   . Diabetes Neg Hx   . Kidney disease Neg Hx     Social History:  reports that he has never smoked. He has never used smokeless tobacco. He reports that he does not drink alcohol or use illicit drugs.  ROS: UROLOGY Frequent Urination?: No Hard to postpone urination?: No Burning/pain with urination?: No Get up at night to urinate?: No Leakage of urine?: No Urine stream starts and stops?: No Trouble starting stream?: Yes Do you have to strain to urinate?: Yes Blood in urine?: No Urinary tract infection?: No Sexually transmitted disease?: No Injury to kidneys or bladder?: No Painful intercourse?: No Weak stream?: No Erection problems?: Yes Penile pain?: No  Gastrointestinal Nausea?: No Vomiting?: No Indigestion/heartburn?: No Diarrhea?: No Constipation?: No  Constitutional Fever: No Night sweats?: No Weight  loss?: No Fatigue?: Yes  Skin Skin rash/lesions?: No Itching?: No  Eyes Blurred vision?: No Double vision?: No  Ears/Nose/Throat Sore throat?: No Sinus problems?: Yes  Hematologic/Lymphatic Swollen glands?: No Easy bruising?: No  Cardiovascular Leg swelling?: No Chest pain?: No  Respiratory Cough?: Yes Shortness of breath?: No  Endocrine Excessive thirst?: No  Musculoskeletal Back pain?: No Joint pain?: No  Neurological Headaches?: No Dizziness?: No  Psychologic Depression?: No Anxiety?: No  Physical Exam: BP 109/70 mmHg  Pulse 105  Ht 5\' 11"  (1.803 m)  Wt  230 lb 6.4 oz (104.509 kg)  BMI 32.15 kg/m2  Constitutional: Well nourished. Alert and oriented, No acute distress. HEENT: [place] AT, moist mucus membranes. Trachea midline, no masses. Cardiovascular: No clubbing, cyanosis, or edema. Respiratory: Normal respiratory effort, no increased work of breathing. Skin: No rashes, bruises or suspicious lesions. Lymph: No cervical or inguinal adenopathy. Neurologic: Grossly intact, no focal deficits, moving all 4 extremities. Psychiatric: Normal mood and affect.  Laboratory Data: Lab Results  Component Value Date   WBC 6.8 03/22/2015   HGB 15.7 03/22/2015   HCT 45.3 03/22/2015   MCV 90.5 03/22/2015   PLT 210.0 03/22/2015   Lab Results  Component Value Date   CREATININE 1.12 03/22/2015   Lab Results  Component Value Date   PSA 1.16 03/22/2015   PSA 1.010 05/04/2013   Lab Results  Component Value Date   HGBA1C 5.8 01/27/2015   Lab Results  Component Value Date   TSH 1.32 05/04/2013      Component Value Date/Time   CHOL 204* 01/27/2015 1032   CHOL 187 05/04/2013   HDL 40.90 01/27/2015 1032   CHOLHDL 5 01/27/2015 1032   VLDL 22.2 01/27/2015 1032   LDLCALC 141* 01/27/2015 1032   LDLCALC 123 05/04/2013    Lab Results  Component Value Date   AST 19 03/22/2015   Lab Results  Component Value Date   ALT 21 03/22/2015      Urinalysis Results for orders placed or performed in visit on 04/18/15  Microscopic Examination  Result Value Ref Range   WBC, UA 0-5 0 -  5 /hpf   RBC, UA 0-2 0 -  2 /hpf   Epithelial Cells (non renal) 0-10 0 - 10 /hpf   Renal Epithel, UA 0-10 (A) None seen /hpf   Bacteria, UA None seen None seen/Few  Urinalysis, Complete  Result Value Ref Range   Specific Gravity, UA 1.020 1.005 - 1.030   pH, UA 5.5 5.0 - 7.5   Color, UA Yellow Yellow   Appearance Ur Clear Clear   Leukocytes, UA Negative Negative   Protein, UA Negative Negative/Trace   Glucose, UA Negative Negative   Ketones, UA Negative Negative   RBC, UA Negative Negative   Bilirubin, UA Negative Negative   Urobilinogen, Ur 0.2 0.2 - 1.0 mg/dL   Nitrite, UA Negative Negative   Microscopic Examination See below:    Pertinent Imaging: CLINICAL DATA: Obstructing right distal ureteral stone with right hydronephrosis on 04/04/2015 CT status post lithotripsy 04/16/2015, presenting for follow-up.  EXAM: RENAL / URINARY TRACT ULTRASOUND COMPLETE  COMPARISON: Unenhanced CT abdomen/pelvis from 04/04/2015.  FINDINGS: Right Kidney:  Length: 12.5 cm. Normal echogenicity. No residual right hydronephrosis. No right renal stones large enough to cause acoustic shadowing. Minimally complex 1.1 x 1.0 x 1.0 cm renal cyst in the interpolar right kidney with thin internal septation and no internal vascularity. Incidentally noted is diffusely echogenic liver parenchyma in the visualized right liver lobe with posterior acoustic attenuation, in keeping with diffuse hepatic steatosis.  Left Kidney:  Length: 12.7 cm. Normal echogenicity. No left hydronephrosis. No left renal stones large enough to cause acoustic shadowing. Simple appearing 1.7 x 1.6 x 1.8 cm renal cyst in the upper left kidney. Simple appearing 1.8 x 1.5 x 2.0 cm renal cyst in the interpolar left kidney.  Bladder:  Appears normal for degree of  bladder distention. Bilateral ureteral jets are demonstrated in the bladder lumen. Initial bladder volume 820 cc. Postvoid bladder volume 82 cc.  IMPRESSION:  1. No residual hydronephrosis. No sonographic evidence of nephrolithiasis. Bilateral ureteral jets in the bladder. 2. Minimally complex right renal cyst. Simple left renal cysts. No suspicious renal masses. 3. Postvoid bladder residual of 82 cc, noting that the bladder was prominently distended prevoid. No bladder wall thickening.   Electronically Signed  By: Ilona Sorrel M.D.  On: 05/19/2015 15:32  Assessment & Plan:    1. Kidney stones:   Patient underwent ESWL for definitive treatment of his right UVJ stone on 04/06/2015.  Stone composition described above.  Encouraged the patient to increase water intake to 2.5 L daily, avoiding sodas, decreasing sodium and protein intake and increase his citrate in his diet.  He is not interested in undergoing a 24-hour urine at this time.  He does have a remaining 3 mm right upper pole nonobstructing calculus.  He will be undergoing a CT of the abdomen with and without contrast for further evaluation of a complex renal cyst in the future.  We can monitor the status of the stone at that time.  2. Weak urinary stream:   Patient would like to continue his tamsulosin 0.4 mg daily as it improved his urinary stream.  Refills are sent to his pharmacy.  Renal ultrasound suggests some obstructive bladder issues.  We will continue to monitor.  3. Hydronephrosis:   Renal ultrasound has demonstrated that the nephrosis has resolved.  4. Peyronie's disease:   Patient describes a significant bend in his penis.  It does not cause discomfort at this time.  He is not sexually active at this time.  We discussed the Xiaflex injections briefly. He would like to do more research on his own. He will add [place] know if he is interested in pursuing therapy.  5. Minimally complex right renal cyst:   Patient will be  undergoing a CT of the abdomen and pelvis with and without contrast for further evaluation of this cyst in August. He will return for CT report.   Return for return in August 2017 for CT report.  These notes generated with voice recognition software. I apologize for typographical errors.  Zara Council, [facility] [address] [place], [place] [address] [phone]

## 2015-06-01 ENCOUNTER — Encounter: Payer: Self-pay | Admitting: Family Medicine

## 2015-06-03 DIAGNOSIS — N281 Cyst of kidney, acquired: Secondary | ICD-10-CM | POA: Insufficient documentation

## 2015-07-11 DIAGNOSIS — L239 Allergic contact dermatitis, unspecified cause: Secondary | ICD-10-CM | POA: Diagnosis not present

## 2015-07-23 ENCOUNTER — Other Ambulatory Visit: Payer: Self-pay | Admitting: Family Medicine

## 2015-07-23 DIAGNOSIS — R7303 Prediabetes: Secondary | ICD-10-CM

## 2015-07-23 DIAGNOSIS — E785 Hyperlipidemia, unspecified: Secondary | ICD-10-CM

## 2015-07-24 ENCOUNTER — Other Ambulatory Visit (INDEPENDENT_AMBULATORY_CARE_PROVIDER_SITE_OTHER): Payer: Federal, State, Local not specified - PPO

## 2015-07-24 DIAGNOSIS — R7303 Prediabetes: Secondary | ICD-10-CM

## 2015-07-24 DIAGNOSIS — E785 Hyperlipidemia, unspecified: Secondary | ICD-10-CM

## 2015-07-24 LAB — LIPID PANEL
CHOL/HDL RATIO: 5
Cholesterol: 187 mg/dL (ref 0–200)
HDL: 37.4 mg/dL — ABNORMAL LOW (ref >39.00)
LDL CALC: 130 mg/dL — AB (ref 0–99)
NonHDL: 150.05
TRIGLYCERIDES: 99 mg/dL (ref 0.0–149.0)
VLDL: 19.8 mg/dL (ref 0.0–40.0)

## 2015-07-24 LAB — HEMOGLOBIN A1C: Hgb A1c MFr Bld: 5.8 % (ref 4.6–6.5)

## 2015-07-28 ENCOUNTER — Ambulatory Visit: Payer: Federal, State, Local not specified - PPO | Admitting: Family Medicine

## 2015-08-08 ENCOUNTER — Telehealth: Payer: Self-pay | Admitting: Urology

## 2015-08-08 NOTE — Telephone Encounter (Signed)
I spoke with the patient regarding the PET vs CT for his renal cyst.  I recommend that he have the CT scan.

## 2015-08-22 ENCOUNTER — Ambulatory Visit
Admission: RE | Admit: 2015-08-22 | Discharge: 2015-08-22 | Disposition: A | Discharge status: Home / Self Care | Payer: Federal, State, Local not specified - PPO | Source: Ambulatory Visit | Attending: Urology | Admitting: Urology

## 2015-08-22 DIAGNOSIS — N200 Calculus of kidney: Secondary | ICD-10-CM | POA: Diagnosis not present

## 2015-08-22 DIAGNOSIS — K8020 Calculus of gallbladder without cholecystitis without obstruction: Secondary | ICD-10-CM | POA: Insufficient documentation

## 2015-08-22 DIAGNOSIS — I700 Atherosclerosis of aorta: Secondary | ICD-10-CM | POA: Insufficient documentation

## 2015-08-22 DIAGNOSIS — K5730 Diverticulosis of large intestine without perforation or abscess without bleeding: Secondary | ICD-10-CM | POA: Insufficient documentation

## 2015-08-22 DIAGNOSIS — N281 Cyst of kidney, acquired: Secondary | ICD-10-CM

## 2015-08-22 DIAGNOSIS — K760 Fatty (change of) liver, not elsewhere classified: Secondary | ICD-10-CM | POA: Diagnosis not present

## 2015-08-22 LAB — POCT I-STAT CREATININE: CREATININE: 1.2 mg/dL (ref 0.61–1.24)

## 2015-08-22 MED ORDER — IOPAMIDOL (ISOVUE-370) INJECTION 76%
100.0000 mL | Freq: Once | INTRAVENOUS | Status: AC | PRN
  Administered 2015-08-22: 100 mL via INTRAVENOUS

## 2015-08-28 NOTE — Progress Notes (Signed)
10:20 AM   Richard Case [birth year] [id number]  Referring provider: [name], MD [address] [place], [place] [address]  Chief Complaint  Patient presents with  . Results    CT scan    HPI: Patient is a 60 year old Caucasian male who presents today to discuss his CT results.    Background history Patient with a history of nephrolithiasis who is referred by his primary care physician office, [name], for right renal colic.  CT scan noted a right UVJ (5 mm x 4 mm x 4 mm) stone causing moderate right hydronephrosis and moderate right hydroureter.  Patient underwent right ESWL for definitive treatment of his UVJ stone.  Patient stated that immediately after he underwent ESWL, he did not experience any pain.  Then 3-4 days later, he experienced right-sided pain that was quite intense. He then passed a fragment.  Follow up KUB demonstrates a possible 1 mm fragment.  He has a history of nephrolithiasis.  He spontaneously passed a stone 5 years ago in [place], [place].  Patient's stone analysis revealed a stone composition of 93% calcium oxalate monohydrate, 5% calcium oxalate dihydrate and 2% calcium phosphate carbonate.    His renal ultrasound from 05/19/2015 noted no residual hydronephrosis. No sonographic evidence of nephrolithiasis. Bilateral ureteral jets in the bladder. Minimally complex right renal cyst. Simple left renal cysts. No suspicious renal masses. Postvoid bladder residual of 82 cc, noting that the bladder was prominently distended pre-void. No bladder wall thickening.  I have reviewed the films with the patient.  Patient would like to stay on the tamsulosin as it improved his urinary symptoms while he was on the medication.  He also evidence to having difficulty with erections. He states that his penis actually has a greater than 90 bend what it is erect.  It is not painful.  He is not sexually active at this time.  He would like more information on  Xiaflex.    Patient underwent CT scan of the abdomen with and without contrast for further evaluation of a complex right renal cyst.  CT scan noted small benign left renal cysts and probable tiny subcentimeter cyst in the right kidney which are too small to characterize. No definite evidence of a renal neoplasm. He also had a tiny nonobstructive left renal calculus without evidence of hydronephrosis. Hepatic stenosis. Cholelithiasis. Colonic diverticulosis and aortic atherosclerosis.  I personally reviewed the films.  PMH: Past Medical History:  Diagnosis Date  . ADD (attention deficit disorder) 2011   no records of workup; strattera caused urinary retention, not interested in habit forming medication  . Atrial ectopy 2011   improved with CPAP, documented by holter  . Benign colon polyp ?2014   hyperplastic  . BPH (benign prostatic hypertrophy)    per prior PCP records  . Elevated blood pressure (not hypertension)   . Erectile dysfunction   . GERD (gastroesophageal reflux disease)   . Gout   . Heart murmur longstanding  . History of kidney stones 2008, 2017  . History of pneumonia 2009  . HLD (hyperlipidemia)   . Male erectile dysfunction   . Obesity, Class I, BMI 30-34.9   . OSA (obstructive sleep apnea) 2010   OSA with stabilization at CPAP 7cm  . Peyronie disease    per prior pcp records  . Polycythemia    ?OSA related  . Prediabetes   . Rosacea    per prior pcp records  . Seasonal allergic rhinitis   .  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 COLONOSCOPY  2009   rec rpt 5 yrs per prior PCP records but no actual report  . COLONOSCOPY  03/2012   mild diverticulosis, rpt 5 yrs (Dr Hulan Saas in [place])  . EXTRACORPOREAL SHOCK WAVE LITHOTRIPSY Right 04/06/2015   Procedure: EXTRACORPOREAL SHOCK WAVE LITHOTRIPSY (ESWL);  Surgeon: Nickie Retort, MD;  Location: [facility] ORS;  Service:  Urology;  Laterality: Right;  . MOHS SURGERY  2015   SCC of nose  . TONSILLECTOMY      Home Medications:    Medication List       Accurate as of 08/29/15 10:20 AM. Always use your most recent med list.          allopurinol 300 MG tablet Commonly known as:  ZYLOPRIM Take 300 mg by mouth daily.   ibuprofen 200 MG tablet Commonly known as:  ADVIL,MOTRIN Take 400 mg by mouth every 6 (six) hours as needed for moderate pain.   metFORMIN 500 MG (MOD) 24 hr tablet Commonly known as:  GLUMETZA Take 1 tablet (500 mg total) by mouth daily with supper.   NON FORMULARY Patient takes an inhaler for coughing spells doesn't know name of medication   oxyCODONE-acetaminophen 5-325 MG tablet Commonly known as:  PERCOCET/ROXICET Take 1 tablet by mouth every 4 (four) hours as needed. Reported on 05/30/2015   promethazine 25 MG suppository Commonly known as:  PHENERGAN Reported on 05/30/2015   tamsulosin 0.4 MG Caps capsule Commonly known as:  FLOMAX TAKE 1 CAPSULE (0.4 MG TOTAL) BY MOUTH DAILY.   traMADol 50 MG tablet Commonly known as:  ULTRAM Take 1 tablet (50 mg total) by mouth every 6 (six) hours as needed.       Allergies:  Allergies  Allergen Reactions  . Strattera [Atomoxetine Hcl] Other (See Comments)    Urinary retention    Family History: Family History  Problem Relation Age of Onset  . Cancer Father 107    prostate  . CAD Father     possibly?  . Cancer Mother     lung  . Lung disease Mother   . Stroke Neg Hx   . Diabetes Neg Hx   . Kidney disease Neg Hx     Social History:  reports that he has never smoked. He has never used smokeless tobacco. He reports that he does not drink alcohol or use drugs.  ROS: UROLOGY Frequent Urination?: No Hard to postpone urination?: No Burning/pain with urination?: No Get up at night to urinate?: No Leakage of urine?: No Urine stream starts and stops?: No Trouble starting stream?: No Do you have to strain to  urinate?: No Blood in urine?: No Urinary tract infection?: No Sexually transmitted disease?: No Injury to kidneys or bladder?: No Painful intercourse?: No Weak stream?: No Erection problems?: No Penile pain?: No  Gastrointestinal Nausea?: No Vomiting?: No Indigestion/heartburn?: No Diarrhea?: No Constipation?: No  Constitutional Fever: No Night sweats?: No Weight loss?: No Fatigue?: No  Skin Skin rash/lesions?: No Itching?: No  Eyes Blurred vision?: No Double vision?: No  Ears/Nose/Throat Sore throat?: No Sinus problems?: No  Hematologic/Lymphatic Swollen glands?: No Easy bruising?: No  Cardiovascular Leg swelling?: No Chest pain?: No  Respiratory Cough?: No Shortness of breath?: No  Endocrine Excessive thirst?: Yes  Musculoskeletal Back pain?: No Joint pain?: No  Neurological Headaches?: No Dizziness?: No  Psychologic Depression?: No Anxiety?:  No  Physical Exam: BP 122/75   Pulse 65   Ht 5\' 11"  ([id number] m)   Wt 234 lb (106.1 kg)   BMI 32.64 kg/m   Constitutional: Well nourished. Alert and oriented, No acute distress. HEENT: [place] AT, moist mucus membranes. Trachea midline, no masses. Cardiovascular: No clubbing, cyanosis, or edema. Respiratory: Normal respiratory effort, no increased work of breathing. Skin: No rashes, bruises or suspicious lesions. Lymph: No cervical or inguinal adenopathy. Neurologic: Grossly intact, no focal deficits, moving all 4 extremities. Psychiatric: Normal mood and affect.  Laboratory Data: Lab Results  Component Value Date   WBC 6.8 03/22/2015   HGB 15.7 03/22/2015   HCT 45.3 03/22/2015   MCV 90.5 03/22/2015   PLT 210.0 03/22/2015   Lab Results  Component Value Date   CREATININE 1.20 08/22/2015   Lab Results  Component Value Date   PSA 1.16 03/22/2015   PSA 1.010 05/04/2013   Lab Results  Component Value Date   HGBA1C 5.8 07/24/2015   Lab Results  Component Value Date   TSH 1.32 05/04/2013        Component Value Date/Time   CHOL 187 07/24/2015 0838   CHOL 187 05/04/2013   HDL 37.40 (L) 07/24/2015 0838   CHOLHDL 5 07/24/2015 0838   VLDL 19.8 07/24/2015 0838   LDLCALC 130 (H) 07/24/2015 0838   LDLCALC 123 05/04/2013    Lab Results  Component Value Date   AST 19 03/22/2015   Lab Results  Component Value Date   ALT 21 03/22/2015     Urinalysis Results for orders placed or performed during the hospital encounter of 08/22/15  I-STAT creatinine  Result Value Ref Range   Creatinine, Ser 1.20 0.61 - 1.24 mg/dL   Pertinent Imaging: CLINICAL DATA:  Complex right renal cyst seen on recent ultrasound. Urolithiasis.  EXAM: CT ABDOMEN WITHOUT AND WITH CONTRAST  TECHNIQUE: Multidetector CT imaging of the abdomen was performed following the standard protocol before and following the bolus administration of intravenous contrast.  CONTRAST:  100 mL Isovue 370  COMPARISON:  Ultrasound on 05/19/2015 and CT on 04/04/2015  FINDINGS: Lower chest:  No acute findings.  Hepatobiliary: Mild to moderate diffuse hepatic steatosis. No liver mass identified. Probable tiny 2-3 mm calcified gallstone noted, without evidence of cholecystitis or biliary ductal dilatation.  Pancreas: No mass, inflammatory changes, or other significant abnormality.  Spleen: Within normal limits in size and appearance.  Adrenals/Urinary Tract: No adrenal masses identified. Punctate 1 mm calculus seen in lower pole of left kidney on image 72/5. No evidence of hydronephrosis.  Two small simple appearing cysts are seen in the upper and mid poles of the left kidney. Several tiny sub-cm low-attenuation lesions are seen in the right kidney which are too small to characterize but most likely represent tiny benign cysts. No complex cystic or solid renal masses are seen involving either kidney.  Stomach/Bowel: No evidence of obstruction, inflammatory process, or abnormal fluid  collections. Diverticulosis seen involving visualized portion of descending colon. No evidence of diverticulitis in this region.  Vascular/Lymphatic: No pathologically enlarged lymph nodes. No evidence of abdominal aortic aneurysm. Aortic atherosclerosis noted.  Other: None.  Musculoskeletal: No suspicious bone lesions identified.  IMPRESSION: Small benign left renal cysts, and probable tiny sub-cm cysts in right kidney which remain too small to characterize. No definite evidence of renal neoplasm.  Tiny nonobstructive left renal calculus. No evidence of hydronephrosis.  Hepatic steatosis.  Cholelithiasis.  No radiographic evidence of cholecystitis.  Colonic diverticulosis. No  radiographic evidence of diverticulitis.  Aortic atherosclerosis.   Electronically Signed   By: Earle Gell M.D.   On: 08/22/2015 12:57 Assessment & Plan:    1. Kidney stones:   Patient underwent ESWL for definitive treatment of his right UVJ stone on 04/06/2015.  Stone composition described above.  Encouraged the patient to increase water intake to 2.5 L daily, avoiding sodas, decreasing sodium and protein intake and increase his citrate in his diet.  He is not interested in undergoing a 24-hour urine at this time.  He does have a remaining 3 mm left lower pole nonobstructing calculus.  KUB in one year.    2. Weak urinary stream:   Patient would like to continue his tamsulosin 0.4 mg daily as it improved his urinary stream.  Renal ultrasound suggests some obstructive bladder issues.  We will continue to monitor.  IPSS, PSA and exam in one year.   3. Peyronie's disease:   Not addressed at this visit.  4. Minimally complex right renal cyst:   Not characterized on CT scan.  RTC in one for RUS report.   Return in about 1 year (around 08/28/2016) for renal ultrasound report.  These notes generated with voice recognition software. I apologize for typographical errors.  Richard Case,  [facility] [address], [place], [place] [address] [phone]

## 2015-08-29 ENCOUNTER — Encounter: Payer: Self-pay | Admitting: Urology

## 2015-08-29 ENCOUNTER — Ambulatory Visit (INDEPENDENT_AMBULATORY_CARE_PROVIDER_SITE_OTHER): Payer: Federal, State, Local not specified - PPO | Admitting: Urology

## 2015-08-29 VITALS — BP 122/75 | HR 65 | Ht 71.0 in | Wt 234.0 lb

## 2015-08-29 DIAGNOSIS — R3912 Poor urinary stream: Secondary | ICD-10-CM

## 2015-08-29 DIAGNOSIS — N200 Calculus of kidney: Secondary | ICD-10-CM | POA: Diagnosis not present

## 2015-08-29 DIAGNOSIS — N281 Cyst of kidney, acquired: Secondary | ICD-10-CM

## 2015-08-29 DIAGNOSIS — Q6102 Congenital multiple renal cysts: Secondary | ICD-10-CM | POA: Diagnosis not present

## 2015-12-04 ENCOUNTER — Ambulatory Visit (INDEPENDENT_AMBULATORY_CARE_PROVIDER_SITE_OTHER): Payer: Federal, State, Local not specified - PPO | Admitting: Family

## 2015-12-04 ENCOUNTER — Encounter: Payer: Self-pay | Admitting: Family

## 2015-12-04 VITALS — BP 116/70 | HR 94 | Temp 97.4°F | Ht 71.0 in | Wt 234.6 lb

## 2015-12-04 DIAGNOSIS — J209 Acute bronchitis, unspecified: Secondary | ICD-10-CM

## 2015-12-04 MED ORDER — AZITHROMYCIN 250 MG PO TABS: ORAL_TABLET | ORAL | 0 refills | Status: DC

## 2015-12-04 NOTE — Progress Notes (Signed)
Subjective:    Patient ID: Richard Case, male    DOB: 07/19/1955, 60 y.o.   MRN: [id number]  CC: Tyee Hoggard is a 60 y.o. male who presents today for an acute visit.    HPI: CC: productive cough, sinus congestion for 2 weeks, unchanged. Notes mother is in the hositpal and wants to visit her. No fever, chills, SOB. Endorses wheezing and sore throat 'from cough' . Has tried delsym, honey, and nyquil without relief.     H/o seasonal allergies; h/o PNA Nonsmoker    HISTORY:  Past Medical History:  Diagnosis Date  . ADD (attention deficit disorder) 2011   no records of workup; strattera caused urinary retention, not interested in habit forming medication  . Atrial ectopy 2011   improved with CPAP, documented by holter  . Benign colon polyp ?2014   hyperplastic  . BPH (benign prostatic hypertrophy)    per prior PCP records  . Elevated blood pressure (not hypertension)   . Erectile dysfunction   . GERD (gastroesophageal reflux disease)   . Gout   . Heart murmur longstanding  . History of kidney stones 2008, 2017  . History of pneumonia 2009  . HLD (hyperlipidemia)   . Male erectile dysfunction   . Obesity, Class I, BMI 30-34.9   . OSA (obstructive sleep apnea) 2010   OSA with stabilization at CPAP 7cm  . Peyronie disease    per prior pcp records  . Polycythemia    ?OSA related  . Prediabetes   . Rosacea    per prior pcp records  . Seasonal allergic rhinitis   . 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 COLONOSCOPY  2009   rec rpt 5 yrs per prior PCP records but no actual report  . COLONOSCOPY  03/2012   mild diverticulosis, rpt 5 yrs (Dr Hulan Saas in [place])  . EXTRACORPOREAL SHOCK WAVE LITHOTRIPSY Right 04/06/2015   Procedure: EXTRACORPOREAL SHOCK WAVE LITHOTRIPSY (ESWL);  Surgeon: Nickie Retort, MD;  Location: [facility] ORS;  Service: Urology;  Laterality: Right;  . MOHS SURGERY  2015   SCC  of nose  . TONSILLECTOMY     Family History  Problem Relation Age of Onset  . Cancer Father 56    prostate  . CAD Father     possibly?  . Cancer Mother     lung  . Lung disease Mother   . Stroke Neg Hx   . Diabetes Neg Hx   . Kidney disease Neg Hx     Allergies: Strattera [atomoxetine hcl] Current Outpatient Prescriptions on File Prior to Visit  Medication Sig Dispense Refill  . allopurinol (ZYLOPRIM) 300 MG tablet Take 300 mg by mouth daily.  3  . ibuprofen (ADVIL,MOTRIN) 200 MG tablet Take 400 mg by mouth every 6 (six) hours as needed for moderate pain.    . metFORMIN (GLUMETZA) 500 MG (MOD) 24 hr tablet Take 1 tablet (500 mg total) by mouth daily with supper. 90 tablet 3  . NON FORMULARY Patient takes an inhaler for coughing spells doesn't know name of medication    . oxyCODONE-acetaminophen (PERCOCET/ROXICET) 5-325 MG tablet Take 1 tablet by mouth every 4 (four) hours as needed. Reported on 05/30/2015  0  . promethazine (PHENERGAN) 25 MG suppository Reported on 05/30/2015  0  . tamsulosin (FLOMAX) 0.4 MG CAPS capsule TAKE 1 CAPSULE (0.4 MG TOTAL) BY MOUTH DAILY. 90 capsule 3  .  traMADol (ULTRAM) 50 MG tablet Take 1 tablet (50 mg total) by mouth every 6 (six) hours as needed. 30 tablet 0   No current facility-administered medications on file prior to visit.     Social History  Substance Use Topics  . Smoking status: Never Smoker  . Smokeless tobacco: Never Used  . Alcohol use No     Comment: rarely    Review of Systems  Constitutional: Negative for chills and fever.  HENT: Positive for congestion and sore throat. Negative for ear pain and sinus pressure.   Respiratory: Positive for cough and wheezing. Negative for shortness of breath.   Cardiovascular: Negative for chest pain and palpitations.  Gastrointestinal: Negative for nausea and vomiting.      Objective:    BP 116/70   Pulse 94   Temp 97.4 F (36.3 C) (Oral)   Ht 5\' 11"  (1.803 m)   Wt 234 lb 9.6 oz  (106.4 kg)   SpO2 95%   BMI 32.72 kg/m    Physical Exam  Constitutional: Vital signs are normal. He appears well-developed and well-nourished.  HENT:  Head: Normocephalic and atraumatic.  Right Ear: Hearing, tympanic membrane, external ear and ear canal normal. No drainage, swelling or tenderness. Tympanic membrane is not injected, not erythematous and not bulging. No middle ear effusion. No decreased hearing is noted.  Left Ear: Hearing, tympanic membrane, external ear and ear canal normal. No drainage, swelling or tenderness. Tympanic membrane is not injected, not erythematous and not bulging.  No middle ear effusion. No decreased hearing is noted.  Nose: Nose normal. Right sinus exhibits no maxillary sinus tenderness and no frontal sinus tenderness. Left sinus exhibits no maxillary sinus tenderness and no frontal sinus tenderness.  Mouth/Throat: Uvula is midline, oropharynx is clear and moist and mucous membranes are normal. No oropharyngeal exudate, posterior oropharyngeal edema, posterior oropharyngeal erythema or tonsillar abscesses.  Eyes: Conjunctivae are normal.  Cardiovascular: Regular rhythm and normal heart sounds.   Pulmonary/Chest: Effort normal and breath sounds normal. No respiratory distress. He has no wheezes. He has no rhonchi. He has no rales.  Lymphadenopathy:       Head (right side): No submental, no submandibular, no tonsillar, no preauricular, no posterior auricular and no occipital adenopathy present.       Head (left side): No submental, no submandibular, no tonsillar, no preauricular, no posterior auricular and no occipital adenopathy present.    He has no cervical adenopathy.  Neurological: He is alert.  Skin: Skin is warm and dry.  Psychiatric: He has a normal mood and affect. His speech is normal and behavior is normal.  Vitals reviewed.      Assessment & Plan:   1. Acute bronchitis, unspecified organism Afebrile. sa02 95. No adventitious lung sounds.  Based on duration of symptoms, patient and I jointly agreed to start antibiotic. Return precautions given. - azithromycin (ZITHROMAX) 250 MG tablet; Tale 500 mg PO on day 1, then 250 mg PO q24h x 4 days.  Dispense: 6 tablet; Refill: 0    I am having Mr. Hambel start on azithromycin. I am also having him maintain his metFORMIN, allopurinol, promethazine, traMADol, ibuprofen, oxyCODONE-acetaminophen, tamsulosin, and NON FORMULARY.   Meds ordered this encounter  Medications  . azithromycin (ZITHROMAX) 250 MG tablet    Sig: Tale 500 mg PO on day 1, then 250 mg PO q24h x 4 days.    Dispense:  6 tablet    Refill:  0    Order Specific Question:  Supervising Provider    Answer:   Crecencio Mc [[id number]]    Return precautions given.   Risks, benefits, and alternatives of the medications and treatment plan prescribed today were discussed, and patient expressed understanding.   Education regarding symptom management and diagnosis given to patient on AVS.  Continue to follow with [name], MD for routine health maintenance.   Richard Case and I agreed with plan.   Mable Paris, FNP

## 2015-12-04 NOTE — Patient Instructions (Signed)

## 2015-12-04 NOTE — Progress Notes (Signed)
Pre visit review using our clinic review tool, if applicable. No additional management support is needed unless otherwise documented below in the visit note. 

## 2015-12-05 ENCOUNTER — Ambulatory Visit: Payer: Federal, State, Local not specified - PPO | Admitting: Family Medicine

## 2016-02-11 ENCOUNTER — Other Ambulatory Visit: Payer: Self-pay | Admitting: Family Medicine

## 2016-02-12 MED ORDER — METFORMIN HCL ER (MOD) 500 MG PO TB24: 500.0000 mg | ORAL_TABLET | Freq: Every day | ORAL | 0 refills | Status: DC

## 2016-02-15 ENCOUNTER — Telehealth: Payer: Self-pay

## 2016-02-15 NOTE — Telephone Encounter (Signed)
[employer] Form filled out and faxed back. Should have a response within 5 business days

## 2016-02-23 ENCOUNTER — Telehealth: Payer: Self-pay

## 2016-02-23 MED ORDER — METFORMIN HCL 500 MG PO TABS: 500.0000 mg | ORAL_TABLET | Freq: Every day | ORAL | 1 refills | Status: DC

## 2016-02-23 NOTE — Telephone Encounter (Signed)
Pt left v/m ins will not cover metformin extended release; pt understands they will cover any metformin that is not extended release. Pt request substitute metformin to [facility] [address]. Pt has CPX scheduled 04/04/16. Pt has been out of med 2 weeks.

## 2016-02-23 NOTE — Telephone Encounter (Signed)
Lab Results  Component Value Date   HGBA1C 5.8 07/24/2015    Noted. Will change to metformin IR.

## 2016-02-23 NOTE — Telephone Encounter (Signed)
PA denied. Must have A1c of 7 or higher. In your IN box for review.

## 2016-02-23 NOTE — Telephone Encounter (Addendum)
plz notify I've sent in metformin IR in its place.

## 2016-02-26 NOTE — Telephone Encounter (Signed)
Message left advising patient.  

## 2016-03-08 ENCOUNTER — Other Ambulatory Visit: Payer: Self-pay | Admitting: Urology

## 2016-03-08 DIAGNOSIS — R3912 Poor urinary stream: Secondary | ICD-10-CM

## 2016-03-27 ENCOUNTER — Other Ambulatory Visit: Payer: Self-pay | Admitting: Family Medicine

## 2016-03-27 DIAGNOSIS — Z125 Encounter for screening for malignant neoplasm of prostate: Secondary | ICD-10-CM

## 2016-03-27 DIAGNOSIS — E785 Hyperlipidemia, unspecified: Secondary | ICD-10-CM

## 2016-03-27 DIAGNOSIS — R7303 Prediabetes: Secondary | ICD-10-CM

## 2016-03-27 DIAGNOSIS — M109 Gout, unspecified: Secondary | ICD-10-CM

## 2016-03-27 DIAGNOSIS — D751 Secondary polycythemia: Secondary | ICD-10-CM

## 2016-03-28 ENCOUNTER — Other Ambulatory Visit: Payer: Federal, State, Local not specified - PPO

## 2016-04-01 ENCOUNTER — Other Ambulatory Visit (INDEPENDENT_AMBULATORY_CARE_PROVIDER_SITE_OTHER): Payer: Federal, State, Local not specified - PPO

## 2016-04-01 DIAGNOSIS — T888 Other specified complications of surgical and medical care, not elsewhere classified: Secondary | ICD-10-CM | POA: Diagnosis not present

## 2016-04-01 DIAGNOSIS — M109 Gout, unspecified: Secondary | ICD-10-CM | POA: Diagnosis not present

## 2016-04-01 DIAGNOSIS — Z125 Encounter for screening for malignant neoplasm of prostate: Secondary | ICD-10-CM

## 2016-04-01 DIAGNOSIS — E785 Hyperlipidemia, unspecified: Secondary | ICD-10-CM | POA: Diagnosis not present

## 2016-04-01 DIAGNOSIS — R7303 Prediabetes: Secondary | ICD-10-CM | POA: Diagnosis not present

## 2016-04-01 LAB — HEMOGLOBIN A1C: Hgb A1c MFr Bld: 5.9 % (ref 4.6–6.5)

## 2016-04-01 LAB — CBC WITH DIFFERENTIAL/PLATELET
BASOS PCT: 0.6 % (ref 0.0–3.0)
Basophils Absolute: 0 10*3/uL (ref 0.0–0.1)
EOS PCT: 4.7 % (ref 0.0–5.0)
Eosinophils Absolute: 0.2 10*3/uL (ref 0.0–0.7)
HCT: 47.6 % (ref 39.0–52.0)
HEMOGLOBIN: 15.9 g/dL (ref 13.0–17.0)
LYMPHS PCT: 31.2 % (ref 12.0–46.0)
Lymphs Abs: 1.6 10*3/uL (ref 0.7–4.0)
MCHC: 33.5 g/dL (ref 30.0–36.0)
MCV: 92.5 fl (ref 78.0–100.0)
Monocytes Absolute: 0.8 10*3/uL (ref 0.1–1.0)
Monocytes Relative: 15.4 % — ABNORMAL HIGH (ref 3.0–12.0)
Neutro Abs: 2.5 10*3/uL (ref 1.4–7.7)
Neutrophils Relative %: 48.1 % (ref 43.0–77.0)
PLATELETS: 233 10*3/uL (ref 150.0–400.0)
RBC: 5.15 Mil/uL (ref 4.22–5.81)
RDW: 13.3 % (ref 11.5–15.5)
WBC: 5.2 10*3/uL (ref 4.0–10.5)

## 2016-04-01 LAB — BASIC METABOLIC PANEL
BUN: 19 mg/dL (ref 6–23)
CHLORIDE: 105 meq/L (ref 96–112)
CO2: 28 meq/L (ref 19–32)
CREATININE: 1.22 mg/dL (ref 0.40–1.50)
Calcium: 9.5 mg/dL (ref 8.4–10.5)
GFR: 64.02 mL/min (ref >60.00)
GLUCOSE: 116 mg/dL — AB (ref 70–99)
Potassium: 4 mEq/L (ref 3.5–5.1)
SODIUM: 140 meq/L (ref 135–145)

## 2016-04-01 LAB — LIPID PANEL
CHOL/HDL RATIO: 4
CHOLESTEROL: 162 mg/dL (ref 0–200)
HDL: 38.2 mg/dL — AB (ref >39.00)
LDL CALC: 107 mg/dL — AB (ref 0–99)
NonHDL: 123.58
Triglycerides: 81 mg/dL (ref 0.0–149.0)
VLDL: 16.2 mg/dL (ref 0.0–40.0)

## 2016-04-01 LAB — PSA: PSA: 1.04 ng/mL (ref 0.10–4.00)

## 2016-04-01 LAB — URIC ACID: Uric Acid, Serum: 7.4 mg/dL (ref 4.0–7.8)

## 2016-04-04 ENCOUNTER — Encounter: Payer: Self-pay | Admitting: Family Medicine

## 2016-04-04 ENCOUNTER — Ambulatory Visit (INDEPENDENT_AMBULATORY_CARE_PROVIDER_SITE_OTHER)
Admission: RE | Admit: 2016-04-04 | Discharge: 2016-04-04 | Disposition: A | Discharge status: Home / Self Care | Payer: Federal, State, Local not specified - PPO | Source: Ambulatory Visit | Attending: Family Medicine | Admitting: Family Medicine

## 2016-04-04 ENCOUNTER — Ambulatory Visit (INDEPENDENT_AMBULATORY_CARE_PROVIDER_SITE_OTHER): Payer: Federal, State, Local not specified - PPO | Admitting: Family Medicine

## 2016-04-04 VITALS — BP 128/78 | HR 72 | Temp 97.9°F | Ht 70.0 in | Wt 240.0 lb

## 2016-04-04 DIAGNOSIS — Z77123 Contact with and (suspected) exposure to radon and other naturally occuring radiation: Secondary | ICD-10-CM

## 2016-04-04 DIAGNOSIS — E669 Obesity, unspecified: Secondary | ICD-10-CM

## 2016-04-04 DIAGNOSIS — N401 Enlarged prostate with lower urinary tract symptoms: Secondary | ICD-10-CM

## 2016-04-04 DIAGNOSIS — N281 Cyst of kidney, acquired: Secondary | ICD-10-CM | POA: Diagnosis not present

## 2016-04-04 DIAGNOSIS — X3901XA Exposure to radon, initial encounter: Secondary | ICD-10-CM

## 2016-04-04 DIAGNOSIS — E785 Hyperlipidemia, unspecified: Secondary | ICD-10-CM | POA: Diagnosis not present

## 2016-04-04 DIAGNOSIS — M109 Gout, unspecified: Secondary | ICD-10-CM

## 2016-04-04 DIAGNOSIS — R3912 Poor urinary stream: Secondary | ICD-10-CM

## 2016-04-04 DIAGNOSIS — E66811 Obesity, class 1: Secondary | ICD-10-CM

## 2016-04-04 DIAGNOSIS — Z0000 Encounter for general adult medical examination without abnormal findings: Secondary | ICD-10-CM

## 2016-04-04 DIAGNOSIS — E119 Type 2 diabetes mellitus without complications: Secondary | ICD-10-CM

## 2016-04-04 MED ORDER — ALLOPURINOL 300 MG PO TABS: 300.0000 mg | ORAL_TABLET | Freq: Every day | ORAL | 3 refills | Status: DC

## 2016-04-04 NOTE — Patient Instructions (Addendum)
Check with insurance on shingrix new shingles shot.  Baseline chest xray today.  Schedule eye doctor exam as you're due.  You are doing well today. AHA recommends 170min/wk moderate intensity aerobic exercise - work on slow titration to this goal. Return as needed or in 1 year for next physical.   Health Maintenance, Male A healthy lifestyle and preventive care is important for your health and wellness. Ask your health care provider about what schedule of regular examinations is right for you. What should I know about weight and diet?  Eat a Healthy Diet  Eat plenty of vegetables, fruits, whole grains, low-fat dairy products, and lean protein.  Do not eat a lot of foods high in solid fats, added sugars, or salt. Maintain a Healthy Weight  Regular exercise can help you achieve or maintain a healthy weight. You should:  Do at least 150 minutes of exercise each week. The exercise should increase your heart rate and make you sweat (moderate-intensity exercise).  Do strength-training exercises at least twice a week. Watch Your Levels of Cholesterol and Blood Lipids  Have your blood tested for lipids and cholesterol every 5 years starting at 62 years of age. If you are at high risk for heart disease, you should start having your blood tested when you are 62 years old. You may need to have your cholesterol levels checked more often if:  Your lipid or cholesterol levels are high.  You are older than 62 years of age.  You are at high risk for heart disease. What should I know about cancer screening? Many types of cancers can be detected early and may often be prevented. Lung Cancer  You should be screened every year for lung cancer if:  You are a current smoker who has smoked for at least 30 years.  You are a former smoker who has quit within the past 15 years.  Talk to your health care provider about your screening options, when you should start screening, and how often you should be  screened. Colorectal Cancer  Routine colorectal cancer screening usually begins at 62 years of age and should be repeated every 5-10 years until you are 62 years old. You may need to be screened more often if early forms of precancerous polyps or small growths are found. Your health care provider may recommend screening at an earlier age if you have risk factors for colon cancer.  Your health care provider may recommend using home test kits to check for hidden blood in the stool.  A small camera at the end of a tube can be used to examine your colon (sigmoidoscopy or colonoscopy). This checks for the earliest forms of colorectal cancer. Prostate and Testicular Cancer  Depending on your age and overall health, your health care provider may do certain tests to screen for prostate and testicular cancer.  Talk to your health care provider about any symptoms or concerns you have about testicular or prostate cancer. Skin Cancer  Check your skin from head to toe regularly.  Tell your health care provider about any new moles or changes in moles, especially if:  There is a change in a mole's size, shape, or color.  You have a mole that is larger than a pencil eraser.  Always use sunscreen. Apply sunscreen liberally and repeat throughout the day.  Protect yourself by wearing long sleeves, pants, a wide-brimmed hat, and sunglasses when outside. What should I know about heart disease, diabetes, and high blood pressure?  If  you are 82-54 years of age, have your blood pressure checked every 3-5 years. If you are 81 years of age or older, have your blood pressure checked every year. You should have your blood pressure measured twice-once when you are at a hospital or clinic, and once when you are not at a hospital or clinic. Record the average of the two measurements. To check your blood pressure when you are not at a hospital or clinic, you can use:  An automated blood pressure machine at a  pharmacy.  A home blood pressure monitor.  Talk to your health care provider about your target blood pressure.  If you are between 42-78 years old, ask your health care provider if you should take aspirin to prevent heart disease.  Have regular diabetes screenings by checking your fasting blood sugar level.  If you are at a normal weight and have a low risk for diabetes, have this test once every three years after the age of 62.  If you are overweight and have a high risk for diabetes, consider being tested at a younger age or more often.  A one-time screening for abdominal aortic aneurysm (AAA) by ultrasound is recommended for men aged 66-75 years who are current or former smokers. What should I know about preventing infection? Hepatitis B  If you have a higher risk for hepatitis B, you should be screened for this virus. Talk with your health care provider to find out if you are at risk for hepatitis B infection. Hepatitis C  Blood testing is recommended for:  Everyone born from [birth year] through 1966.  Anyone with known risk factors for hepatitis C. Sexually Transmitted Diseases (STDs)  You should be screened each year for STDs including gonorrhea and chlamydia if:  You are sexually active and are younger than 62 years of age.  You are older than 62 years of age and your health care provider tells you that you are at risk for this type of infection.  Your sexual activity has changed since you were last screened and you are at an increased risk for chlamydia or gonorrhea. Ask your health care provider if you are at risk.  Talk with your health care provider about whether you are at high risk of being infected with HIV. Your health care provider may recommend a prescription medicine to help prevent HIV infection. What else can I do?  Schedule regular health, dental, and eye exams.  Stay current with your vaccines (immunizations).  Do not use any tobacco products, such as  cigarettes, chewing tobacco, and e-cigarettes. If you need help quitting, ask your health care provider.  Limit alcohol intake to no more than 2 drinks per day. One drink equals 12 ounces of beer, 5 ounces of wine, or 1 ounces of hard liquor.  Do not use street drugs.  Do not share needles.  Ask your health care provider for help if you need support or information about quitting drugs.  Tell your health care provider if you often feel depressed.  Tell your health care provider if you have ever been abused or do not feel safe at home. This information is not intended to replace advice given to you by your health care provider. Make sure you discuss any questions you have with your health care provider. Document Released: 06/29/2007 Document Revised: 08/30/2015 Document Reviewed: 10/04/2014 Elsevier Interactive Patient Education  2017 [employer].

## 2016-04-04 NOTE — Assessment & Plan Note (Signed)
Weight gain noted. Discussed healthy diet and lifestyle changes to affect sustainable weight loss.  

## 2016-04-04 NOTE — Assessment & Plan Note (Signed)
Discussed healthy diet changes to improve lipid control.

## 2016-04-04 NOTE — Assessment & Plan Note (Addendum)
Chronic, persistently good control on metformin 500mg  IR once daily. Continue.  Foot exam today with evidence of decreased peripheral pulses but patient denies claudication symptoms.  Encouraged schedule eye exam as he's due.  Suggested he start aspirin 81mg  MWF

## 2016-04-04 NOTE — Progress Notes (Signed)
Pre visit review using our clinic review tool, if applicable. No additional management support is needed unless otherwise documented below in the visit note. 

## 2016-04-04 NOTE — Assessment & Plan Note (Addendum)
[place] high radon levels 30-60 (8-16 times recommended amount).  Working on mitigating exposure. We will keep this in mind and discussed slightly increased lung cancer risk.

## 2016-04-04 NOTE — Progress Notes (Signed)
BP 128/78   Pulse 72   Temp 97.9 F (36.6 C) (Oral)   Ht 5\' 10"  (1.778 m)   Wt 240 lb (108.9 kg)   BMI 34.44 kg/m    CC: CPE Subjective:    Patient ID: Richard Case, male    DOB: 12-Nov-1955, 61 y.o.   MRN: [id number]  HPI: Richard Case is a 61 y.o. male presenting on 04/04/2016 for Annual Exam   Mother passed away several months ago.  Insurance stopped covering metformin ER. We changed to IR metformin.  Pt established with urology Dr Gunnar Bulla for h/o kidney stones ([facility] 03/2015), BPH, complex R renal cyst.   [place] Requests baseline CXR. Known scarring from prior PNA.   Preventative: COLONOSCOPY 03/2012 mild diverticulosis, rpt 5 yrs (Dr Hulan Saas in [place])  Prostate cancer screening - established with urology - followed there.  Flu shot yearly  Pneumovax 2017  Tdap 2017  Shingles shot - will check with insurance  Seat belt use discussed  Sunscreen use discussed. No changing moles on skin.  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 Interim medical history since our last visit reviewed. Allergies and medications reviewed and updated. Outpatient Medications Prior to Visit  Medication Sig Dispense Refill  . ibuprofen (ADVIL,MOTRIN) 200 MG tablet Take 400 mg by mouth every 6 (six) hours as needed for moderate pain.    . metFORMIN (GLUCOPHAGE) 500 MG tablet Take 1 tablet (500 mg total) by mouth daily with breakfast. 90 tablet 1  . tamsulosin (FLOMAX) 0.4 MG CAPS capsule TAKE 1 CAPSULE (0.4 MG TOTAL) BY MOUTH DAILY. 90 capsule 3  . allopurinol (ZYLOPRIM) 300 MG tablet Take 300 mg by mouth daily.  3  . azithromycin (ZITHROMAX) 250 MG tablet Tale 500 mg PO on day 1, then 250  mg PO q24h x 4 days. (Patient not taking: Reported on 04/04/2016) 6 tablet 0  . NON FORMULARY Patient takes an inhaler for coughing spells doesn't know name of medication    . oxyCODONE-acetaminophen (PERCOCET/ROXICET) 5-325 MG tablet Take 1 tablet by mouth every 4 (four) hours as needed. Reported on 05/30/2015  0  . promethazine (PHENERGAN) 25 MG suppository Reported on 05/30/2015  0  . traMADol (ULTRAM) 50 MG tablet Take 1 tablet (50 mg total) by mouth every 6 (six) hours as needed. (Patient not taking: Reported on 04/04/2016) 30 tablet 0   No facility-administered medications prior to visit.      Per HPI unless specifically indicated in ROS section below Review of Systems  Constitutional: Positive for fatigue (mild). Negative for activity change, appetite change, chills, fever and unexpected weight change.  HENT: Negative for hearing loss.   Eyes: Negative for visual disturbance.  Respiratory: Negative for cough, chest tightness, shortness of breath and wheezing.   Cardiovascular: Negative for chest pain, palpitations and leg swelling.  Gastrointestinal: Negative for abdominal distention, abdominal pain, blood in stool, constipation, diarrhea, nausea and vomiting.  Genitourinary: Negative for difficulty urinating and hematuria.  Musculoskeletal: Negative for arthralgias, myalgias and neck pain.  Skin: Negative for rash.  Neurological: Negative for dizziness, seizures, syncope and headaches.  Hematological: Negative for adenopathy. Does not bruise/bleed easily.  Psychiatric/Behavioral: Negative for dysphoric mood. The patient is  not nervous/anxious.        Objective:    BP 128/78   Pulse 72   Temp 97.9 F (36.6 C) (Oral)   Ht 5\' 10"  (1.778 m)   Wt 240 lb (108.9 kg)   BMI 34.44 kg/m   Wt Readings from Last 3 Encounters:  04/04/16 240 lb (108.9 kg)  12/04/15 234 lb 9.6 oz (106.4 kg)  08/29/15 234 lb (106.1 kg)    Physical Exam  Constitutional: He is oriented to person, place,  and time. He appears well-developed and well-nourished. No distress.  HENT:  Head: Normocephalic and atraumatic.  Right Ear: Hearing, tympanic membrane, external ear and ear canal normal.  Left Ear: Hearing, tympanic membrane, external ear and ear canal normal.  Nose: Nose normal.  Mouth/Throat: Uvula is midline, oropharynx is clear and moist and mucous membranes are normal. No oropharyngeal exudate, posterior oropharyngeal edema or posterior oropharyngeal erythema.  Eyes: Conjunctivae and EOM are normal. Pupils are equal, round, and reactive to light. No scleral icterus.  Neck: Normal range of motion. Neck supple. Carotid bruit is not present. No thyromegaly present.  Cardiovascular: Normal rate, regular rhythm, normal heart sounds and intact distal pulses.   No murmur heard. Pulses:      Radial pulses are 2+ on the right side, and 2+ on the left side.  Pulmonary/Chest: Effort normal and breath sounds normal. No respiratory distress. He has no wheezes. He has no rales.  Abdominal: Soft. Bowel sounds are normal. He exhibits no distension and no mass. There is no tenderness. There is no rebound and no guarding.  Musculoskeletal: Normal range of motion. He exhibits no edema.  Lymphadenopathy:    He has no cervical adenopathy.  Neurological: He is alert and oriented to person, place, and time.  CN grossly intact, station and gait intact  Skin: Skin is warm and dry. No rash noted.  Psychiatric: He has a normal mood and affect. His behavior is normal. Judgment and thought content normal.  Nursing note and vitals reviewed.  Diabetic Foot Exam - Simple   Simple Foot Form Diabetic Foot exam was performed with the following findings:  Yes 04/04/2016 12:30 PM  Visual Inspection No deformities, no ulcerations, no other skin breakdown bilaterally:  Yes Sensation Testing Intact to touch and monofilament testing bilaterally:  Yes Pulse Check See comments:  Yes Comments 1+ DP bilaterally       Results for orders placed or performed in visit on 04/01/16  Lipid panel  Result Value Ref Range   Cholesterol 162 0 - 200 mg/dL   Triglycerides 81.0 0.0 - 149.0 mg/dL   HDL 38.20 (L) >39.00 mg/dL   VLDL 16.2 0.0 - 40.0 mg/dL   LDL Cholesterol 107 (H) 0 - 99 mg/dL   Total CHOL/HDL Ratio 4    NonHDL 123.58   Hemoglobin A1c  Result Value Ref Range   Hgb A1c MFr Bld 5.9 4.6 - 6.5 %  PSA  Result Value Ref Range   PSA 1.04 0.10 - 4.00 ng/mL  Basic metabolic panel  Result Value Ref Range   Sodium 140 135 - 145 mEq/L   Potassium 4.0 3.5 - 5.1 mEq/L   Chloride 105 96 - 112 mEq/L   CO2 28 19 - 32 mEq/L   Glucose, Bld 116 (H) 70 - 99 mg/dL   BUN 19 6 - 23 mg/dL   Creatinine, Ser 1.22 0.40 - 1.50 mg/dL   Calcium 9.5 8.4 - 10.5 mg/dL   GFR 64.02 >60.00 mL/min  Uric acid  Result Value Ref Range   Uric Acid, Serum 7.4 4.0 - 7.8 mg/dL  CBC with Differential/Platelet  Result Value Ref Range   WBC 5.2 4.0 - 10.5 K/uL   RBC 5.15 4.22 - 5.81 Mil/uL   Hemoglobin 15.9 13.0 - 17.0 g/dL   HCT 47.6 39.0 - 52.0 %   MCV 92.5 78.0 - 100.0 fl   MCHC 33.5 30.0 - 36.0 g/dL   RDW 13.3 11.5 - 15.5 %   Platelets 233.0 150.0 - 400.0 K/uL   Neutrophils Relative % 48.1 43.0 - 77.0 %   Lymphocytes Relative 31.2 12.0 - 46.0 %   Monocytes Relative 15.4 (H) 3.0 - 12.0 %   Eosinophils Relative 4.7 0.0 - 5.0 %   Basophils Relative 0.6 0.0 - 3.0 %   Neutro Abs 2.5 1.4 - 7.7 K/uL   Lymphs Abs 1.6 0.7 - 4.0 K/uL   Monocytes Absolute 0.8 0.1 - 1.0 K/uL   Eosinophils Absolute 0.2 0.0 - 0.7 K/uL   Basophils Absolute 0.0 0.0 - 0.1 K/uL      Assessment & Plan:   Problem List Items Addressed This Visit    BPH (benign prostatic hyperplasia)    Followed by urology. Stable on flomax.      Complex renal cyst    Followed by urology.       Controlled diabetes mellitus type II without complication (HCC)    Chronic, persistently good control on metformin 500mg  IR once daily. Continue.  Foot exam today with  evidence of decreased peripheral pulses but patient denies claudication symptoms.  Encouraged schedule eye exam as he's due.  Suggested he start aspirin 81mg  MWF      Relevant Medications   aspirin 81 MG chewable tablet   Gout    Chronic, stable on allopurinol 300mg  daily.  Urate above goal however no changes indicated as no recent gout flare.       Health maintenance examination - Primary    Preventative protocols reviewed and updated unless pt declined. Discussed healthy diet and lifestyle.       HLD (hyperlipidemia)    Discussed healthy diet changes to improve lipid control.       Relevant Medications   aspirin 81 MG chewable tablet   Obesity, Class I, BMI 30-34.9    Weight gain noted. Discussed healthy diet and lifestyle changes to affect sustainable weight loss.       [place]on exposure    [place] Found [place] high radon levels 30-60 (8-16 times recommended amount).  Working on mitigating exposure. We will keep this in mind and discussed slightly increased lung cancer risk.       Relevant Orders   DG Chest 2 View       Follow up plan: Return in about 1 year (around 04/04/2017) for annual exam, prior fasting for blood work.  [name], MD

## 2016-04-04 NOTE — Assessment & Plan Note (Signed)
Followed by urology.   

## 2016-04-04 NOTE — Assessment & Plan Note (Signed)
Preventative protocols reviewed and updated unless pt declined. Discussed healthy diet and lifestyle.  

## 2016-04-04 NOTE — Assessment & Plan Note (Signed)
Chronic, stable on allopurinol 300mg  daily.  Urate above goal however no changes indicated as no recent gout flare.

## 2016-04-04 NOTE — Assessment & Plan Note (Signed)
Followed by urology. Stable on flomax.

## 2016-06-19 ENCOUNTER — Other Ambulatory Visit: Payer: Self-pay | Admitting: Family Medicine

## 2016-08-28 ENCOUNTER — Ambulatory Visit: Payer: Federal, State, Local not specified - PPO

## 2016-08-29 ENCOUNTER — Ambulatory Visit
Admission: RE | Admit: 2016-08-29 | Discharge: 2016-08-29 | Disposition: A | Discharge status: Home / Self Care | Payer: Federal, State, Local not specified - PPO | Source: Ambulatory Visit | Attending: Urology | Admitting: Urology

## 2016-08-29 DIAGNOSIS — N281 Cyst of kidney, acquired: Secondary | ICD-10-CM | POA: Diagnosis not present

## 2016-08-30 ENCOUNTER — Telehealth: Payer: Self-pay

## 2016-08-30 NOTE — Telephone Encounter (Signed)
-----   Message from Nori Riis, PA-C sent at 08/30/2016  7:45 AM EDT ----- Please let Mr. Aversa know it is time for his one year follow up for RUS report, KUB and prostate exam.

## 2016-08-30 NOTE — Telephone Encounter (Signed)
LMOM of cell phone "Home number" not working.

## 2016-09-03 ENCOUNTER — Other Ambulatory Visit: Payer: Self-pay | Admitting: Family Medicine

## 2016-09-03 NOTE — Telephone Encounter (Signed)
Left message with male of the home to have pt return call.

## 2016-09-06 NOTE — Telephone Encounter (Signed)
Pt called back and made f/u appt.

## 2016-09-10 NOTE — Progress Notes (Signed)
8:42 AM   Richard Case 1955/07/20 [id number]  Referring provider: [name], MD [address] [place], [place] [address]  Chief Complaint  Patient presents with  . Results    RUS    HPI: Patient is a 61 year old Caucasian male with a history nephrolithiasis, complex renal cyst and BPH with LU TS who presents today to for one year follow up.      Background history Patient with a history of nephrolithiasis who is referred by his primary care physician office, [name], for right renal colic.  CT scan noted a right UVJ (5 mm x 4 mm x 4 mm) stone causing moderate right hydronephrosis and moderate right hydroureter.  Patient underwent right ESWL for definitive treatment of his UVJ stone.  Patient stated that immediately after he underwent ESWL, he did not experience any pain.  Then 3-4 days later, he experienced right-sided pain that was quite intense. He then passed a fragment.  Follow up KUB demonstrates a possible 1 mm fragment.  He has a history of nephrolithiasis.  He spontaneously passed a stone 5 years ago in [place], [place].  Patient's stone analysis revealed a stone composition of 93% calcium oxalate monohydrate, 5% calcium oxalate dihydrate and 2% calcium phosphate carbonate.  His renal ultrasound from 05/19/2015 noted no residual hydronephrosis. No sonographic evidence of nephrolithiasis. Bilateral ureteral jets in the bladder. Minimally complex right renal cyst. Simple left renal cysts. No suspicious renal masses. Postvoid bladder residual of 82 cc, noting that the bladder was prominently distended pre-void. No bladder wall thickening.   Patient would like to stay on the tamsulosin as it improved his urinary symptoms while he was on the medication.  He also evidence to having difficulty with erections. He states that his penis actually has a greater than 90 bend what it is erect.  It is not painful.  He is not sexually active at this time.  He would like more  information on Xiaflex.  Patient underwent CT scan of the abdomen with and without contrast for further evaluation of a complex right renal cyst.  CT scan noted small benign left renal cysts and probable tiny subcentimeter cyst in the right kidney which are too small to characterize. No definite evidence of a renal neoplasm. He also had a tiny nonobstructive left renal calculus without evidence of hydronephrosis. Hepatic stenosis. Cholelithiasis. Colonic diverticulosis and aortic atherosclerosis.   RUS performed on 08/29/2016 noted small renal cysts in each kidney. A septation is seen in the small right renal cyst. Extrarenal pelvis on the right. Study otherwise unremarkable.  BPH WITH LUTS  (prostate and/or bladder) His IPSS score today is 10, which is moderate lower urinary tract symptomatology.  He is mixed with his quality life due to his urinary symptoms. His PVR is 93 mL.  His major complaints today are intermittency.  He has had these symptoms for several years.  He denies any dysuria, hematuria or suprapubic pain.   He currently taking tamsulosin 0.4 mg daily.     He also denies any recent fevers, chills, nausea or vomiting.  Patient's father was diagnosed with prostate cancer at age 18.        IPSS    Row Name 09/11/16 1400         International Prostate Symptom Score   How often have you had the sensation of not emptying your bladder? Almost always     How often have you had to urinate less than every  two hours? Not at All     How often have you found you stopped and started again several times when you urinated? Less than 1 in 5 times     How often have you found it difficult to postpone urination? About half the time     How often have you had a weak urinary stream? Less than 1 in 5 times     How often have you had to strain to start urination? Not at All     How many times did you typically get up at night to urinate? None     Total IPSS Score 10       Quality of Life due  to urinary symptoms   If you were to spend the rest of your life with your urinary condition just the way it is now how would you feel about that? Mixed        Score:  1-7 Mild 8-19 Moderate 20-35 Severe   PMH: Past Medical History:  Diagnosis Date  . ADD (attention deficit disorder) 2011   no records of workup; strattera caused urinary retention, not interested in habit forming medication  . Atrial ectopy 2011   improved with CPAP, documented by holter  . Benign colon polyp ?2014   hyperplastic  . BPH (benign prostatic hypertrophy)    per prior PCP records  . Elevated blood pressure (not hypertension)   . Erectile dysfunction   . GERD (gastroesophageal reflux disease)   . Gout   . Heart murmur longstanding  . History of kidney stones 2008, 2017  . History of pneumonia 2009  . HLD (hyperlipidemia)   . Male erectile dysfunction   . Obesity, Class I, BMI 30-34.9   . OSA (obstructive sleep apnea) 2010   OSA with stabilization at CPAP 7cm  . Peyronie disease    per prior pcp records  . Polycythemia    ?OSA related  . Prediabetes   . Rosacea    per prior pcp records  . Seasonal allergic rhinitis   . 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 COLONOSCOPY  2009   rec rpt 5 yrs per prior PCP records but no actual report  . COLONOSCOPY  03/2012   mild diverticulosis, rpt 5 yrs (Dr Hulan Saas in [place])  . EXTRACORPOREAL SHOCK WAVE LITHOTRIPSY Right 04/06/2015   Procedure: EXTRACORPOREAL SHOCK WAVE LITHOTRIPSY (ESWL);  Surgeon: Nickie Retort, MD;  Location: [facility] ORS;  Service: Urology;  Laterality: Right;  . MOHS SURGERY  2015   SCC of nose  . TONSILLECTOMY      Home Medications:  Allergies as of 09/11/2016      Reactions   Strattera [atomoxetine Hcl] Other (See Comments)   Urinary retention      Medication List       Accurate as of 09/11/16 11:59 PM. Always use your most  recent med list.          allopurinol 300 MG tablet Commonly known as:  ZYLOPRIM Take 1 tablet (300 mg total) by mouth daily.   aspirin 81 MG chewable tablet Chew 81 mg by mouth every Monday, Wednesday, and Friday.   ibuprofen 200 MG tablet Commonly known as:  ADVIL,MOTRIN Take 400 mg by mouth every 6 (six) hours as needed for moderate pain.   metFORMIN 500 MG tablet Commonly known as:  GLUCOPHAGE TAKE 1 TABLET (500 MG TOTAL) BY  MOUTH DAILY WITH BREAKFAST.   tamsulosin 0.4 MG Caps capsule Commonly known as:  FLOMAX TAKE 1 CAPSULE (0.4 MG TOTAL) BY MOUTH DAILY.            Discharge Care Instructions        Start     Ordered   09/11/16 0000  BLADDER SCAN AMB NON-IMAGING     09/11/16 1445   09/11/16 0000  tamsulosin (FLOMAX) 0.4 MG CAPS capsule    Question:  Supervising Provider  Answer:  Hollice Espy   09/11/16 1511   09/11/16 0000  US Renal    Question Answer Comment  Reason for Exam (SYMPTOM  OR DIAGNOSIS REQUIRED) renal cysts   Preferred imaging location? [facility]      09/11/16 1521      Allergies:  Allergies  Allergen Reactions  . Strattera [Atomoxetine Hcl] Other (See Comments)    Urinary retention    Family History: Family History  Problem Relation Age of Onset  . Cancer Father 82       prostate  . CAD Father        possibly?  . Prostate cancer Father   . Cancer Mother        lung  . Lung disease Mother   . Stroke Neg Hx   . Diabetes Neg Hx   . Kidney disease Neg Hx   . Kidney cancer Neg Hx   . Bladder Cancer Neg Hx     Social History:  reports that he has never smoked. He has never used smokeless tobacco. He reports that he does not drink alcohol or use drugs.  ROS: UROLOGY Frequent Urination?: No Hard to postpone urination?: No Burning/pain with urination?: No Get up at night to urinate?: No Leakage of urine?: No Urine stream starts and stops?: No Trouble starting stream?: Yes Do you have to strain to urinate?:  No Blood in urine?: No Urinary tract infection?: No Sexually transmitted disease?: No Injury to kidneys or bladder?: No Painful intercourse?: No Weak stream?: No Erection problems?: Yes Penile pain?: No  Gastrointestinal Nausea?: No Vomiting?: No Indigestion/heartburn?: No Diarrhea?: No Constipation?: No  Constitutional Fever: No Night sweats?: No Weight loss?: No Fatigue?: No  Skin Skin rash/lesions?: No Itching?: No  Eyes Blurred vision?: No Double vision?: No  Ears/Nose/Throat Sore throat?: No Sinus problems?: No  Hematologic/Lymphatic Swollen glands?: No Easy bruising?: No  Cardiovascular Leg swelling?: No Chest pain?: No  Respiratory Cough?: No Shortness of breath?: No  Endocrine Excessive thirst?: No  Musculoskeletal Back pain?: No Joint pain?: No  Neurological Headaches?: No Dizziness?: No  Psychologic Depression?: No Anxiety?: No  Physical Exam: BP (!) 155/83   Pulse 96   Ht 5\' 11"  (1.803 m)   Wt 239 lb 4.8 oz (108.5 kg)   BMI 33.38 kg/m   Constitutional: Well nourished. Alert and oriented, No acute distress. HEENT: [place] AT, moist mucus membranes. Trachea midline, no masses. Cardiovascular: No clubbing, cyanosis, or edema. Respiratory: Normal respiratory effort, no increased work of breathing. GI: Abdomen is soft, non tender, non distended, no abdominal masses. Liver and spleen not palpable.  No hernias appreciated.  Stool sample for occult testing is not indicated.   GU: No CVA tenderness.  No bladder fullness or masses.  Patient with circumcised phallus.  Urethral meatus is patent.  No penile discharge. No penile lesions or rashes. Scrotum without lesions, cysts, rashes and/or edema.  Testicles are located scrotally bilaterally. No masses are appreciated in the testicles. Left and right  epididymis are normal. Rectal: Patient with  normal sphincter tone. Anus and perineum without scarring or rashes. No rectal masses are appreciated.  Prostate is approximately 45 grams, no nodules are appreciated. Seminal vesicles are normal. Skin: No rashes, bruises or suspicious lesions. Lymph: No cervical or inguinal adenopathy. Neurologic: Grossly intact, no focal deficits, moving all 4 extremities. Psychiatric: Normal mood and affect.  Laboratory Data: Lab Results  Component Value Date   WBC 5.2 04/01/2016   HGB 15.9 04/01/2016   HCT 47.6 04/01/2016   MCV 92.5 04/01/2016   PLT 233.0 04/01/2016   Lab Results  Component Value Date   CREATININE 1.22 04/01/2016   Lab Results  Component Value Date   PSA 1.04 04/01/2016   PSA 1.16 03/22/2015   PSA 1.010 05/04/2013   Lab Results  Component Value Date   HGBA1C 5.9 04/01/2016     Results for orders placed or performed in visit on 09/11/16  BLADDER SCAN AMB NON-IMAGING  Result Value Ref Range   Scan Result 93    I have reviewed the labs  Pertinent Imaging: CLINICAL DATA:  Renal cysts  EXAM: RENAL / URINARY TRACT ULTRASOUND COMPLETE  COMPARISON:  CT abdomen and pelvis August 22, 2015  FINDINGS: Right Kidney:  Length: 12.7 cm. Echogenicity and renal cortical thickness are within normal limits. No perinephric fluid or hydronephrosis visualized. The patient has a prominent extrarenal pelvis on the right, also seen on prior CT examination. There is a cyst containing a focal septation in the mid right kidney measuring 1.3 x 0.9 x 1.0 cm. No sonographically demonstrable calculus or ureterectasis.  Left Kidney:  Length: 13.1 cm. Echogenicity and renal cortical thickness are within normal limits. No perinephric fluid or hydronephrosis visualized. There is a cyst in the upper pole region measuring 1.9 x 1.5 x 2.2 cm. A midportion cyst on the left measures 1.6 x 1.3 x 1.8 cm. No sonographically demonstrable calculus or ureterectasis.  Bladder:  Appears normal for degree of bladder distention.  IMPRESSION: Small renal cysts in each kidney. A septation is  seen in the small right renal cyst. Extrarenal pelvis on the right. Study otherwise unremarkable.   Electronically Signed   By: Lowella Grip III M.D.   On: 08/29/2016 11:30      Assessment & Plan:    1. History of nephrolithiasis  - no stone seen on RUS  - repeat RUS in one year  - Advised to contact our office or seek treatment in the ED if becomes febrile or pain/ vomiting are difficult control in order to arrange for emergent/urgent intervention  2. Weak urinary stream:   I PSS score is 10/3.  Patient would like to continue his tamsulosin 0.4 mg daily as it improved his urinary stream.  We will continue to monitor.  IPSS, PSA and exam in one year.   3. Peyronie's disease:  Patient is not interested in pursuing any treatment options at this time.    4. Minimally complex right renal cyst:   Not characterized on CT scan.  RTC in one for RUS report.   Return in about 1 year (around 09/11/2017) for I PSS, PSA, PVR and exam.  These notes generated with voice recognition software. I apologize for typographical errors.  Zara Council, [facility] [address] [place], [place] [address] [phone]

## 2016-09-11 ENCOUNTER — Ambulatory Visit: Payer: Federal, State, Local not specified - PPO | Admitting: Urology

## 2016-09-11 ENCOUNTER — Encounter: Payer: Self-pay | Admitting: Urology

## 2016-09-11 VITALS — BP 155/83 | HR 96 | Ht 71.0 in | Wt 239.3 lb

## 2016-09-11 DIAGNOSIS — R3912 Poor urinary stream: Secondary | ICD-10-CM | POA: Diagnosis not present

## 2016-09-11 DIAGNOSIS — N486 Induration penis plastica: Secondary | ICD-10-CM

## 2016-09-11 DIAGNOSIS — N281 Cyst of kidney, acquired: Secondary | ICD-10-CM

## 2016-09-11 DIAGNOSIS — N200 Calculus of kidney: Secondary | ICD-10-CM | POA: Diagnosis not present

## 2016-09-11 LAB — BLADDER SCAN AMB NON-IMAGING: SCAN RESULT: 93

## 2016-09-11 MED ORDER — TAMSULOSIN HCL 0.4 MG PO CAPS: ORAL_CAPSULE | ORAL | 3 refills | Status: DC

## 2017-01-15 ENCOUNTER — Telehealth: Payer: Self-pay | Admitting: Family Medicine

## 2017-01-15 NOTE — Telephone Encounter (Signed)
Copied from [place] [id number]. Topic: Quick Communication - See Telephone Encounter >> Jan 15, 2017 12:55 PM Vernona Rieger wrote: CRM for notification. See Telephone encounter for:   01/15/17. Pt is going on a 5 day cruise & wants to know if the dr will call in a patch for him to wear while on the cruise for sickness. Pharmacy is [facility] on [address] [place]

## 2017-01-15 NOTE — Telephone Encounter (Signed)
Pt  Is    Going  On a  Cruise   Soon  And  He  Is  Asking  For Rx  For a  Motion  Sickness  Patch  To  Be  Sent in    [place]   Was    04/04/2016

## 2017-01-16 MED ORDER — SCOPOLAMINE 1 MG/3DAYS TD PT72: 1.0000 | MEDICATED_PATCH | TRANSDERMAL | 0 refills | Status: DC

## 2017-01-16 NOTE — Telephone Encounter (Signed)
scopolamine patch sent to pharmacy

## 2017-01-20 DIAGNOSIS — K219 Gastro-esophageal reflux disease without esophagitis: Secondary | ICD-10-CM | POA: Diagnosis not present

## 2017-01-20 DIAGNOSIS — R05 Cough: Secondary | ICD-10-CM | POA: Diagnosis not present

## 2017-02-07 IMAGING — CR DG ABDOMEN 1V
1 series · 2 of 2 positions shown · non-contrast
Comparison: Previously noted calcifications at the right UPJ are no
longer present. Distal fell calcifications are compatible with
phleboliths.

CLINICAL DATA: Personal history of kidney stones. Lithotripsy 2
weeks ago. No current symptoms.

EXAM:
ABDOMEN - 1 VIEW

[Series 1: dg abd 1 view · 0.14mm/px · 2 of 2 slices shown]
[im 1/2]
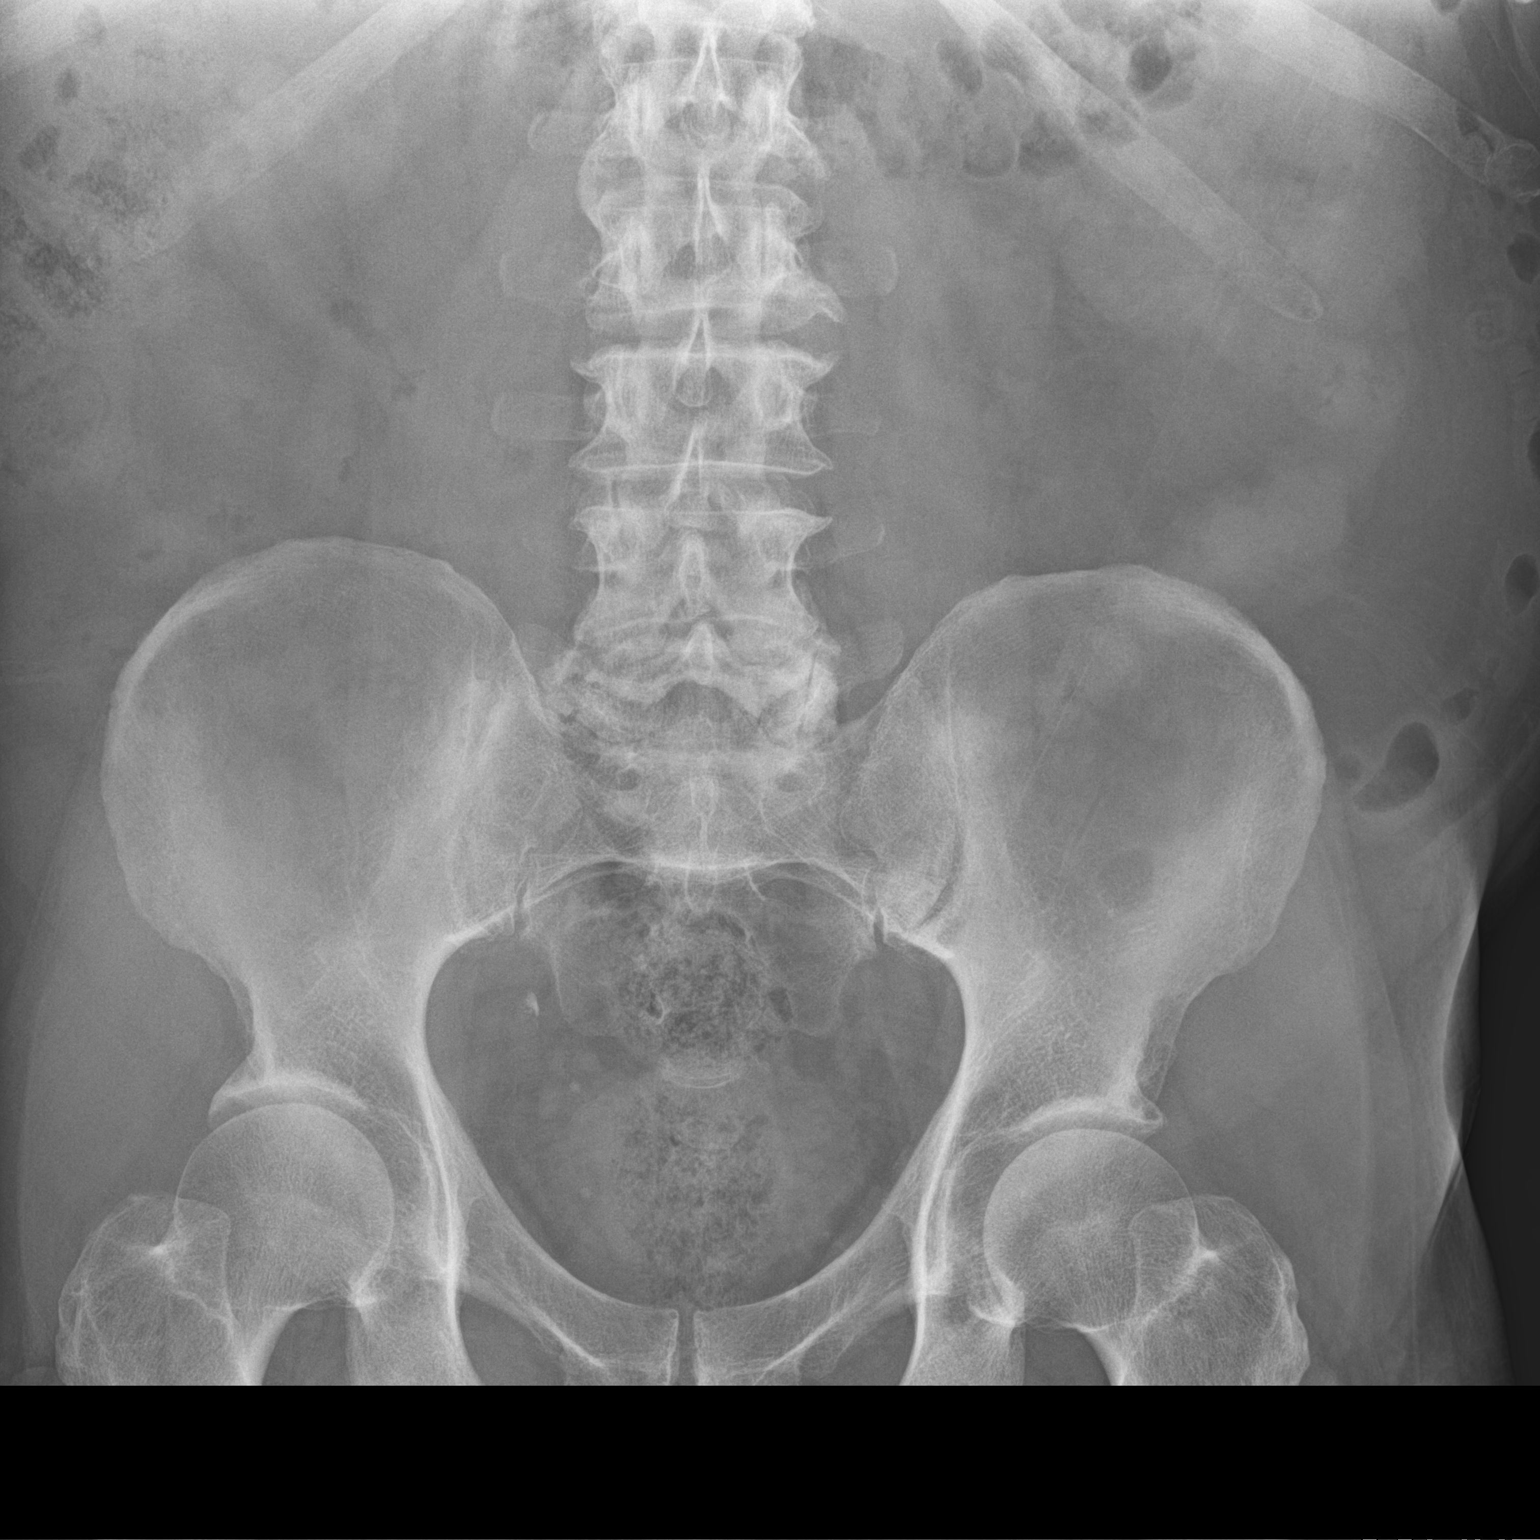
[im 2/2]
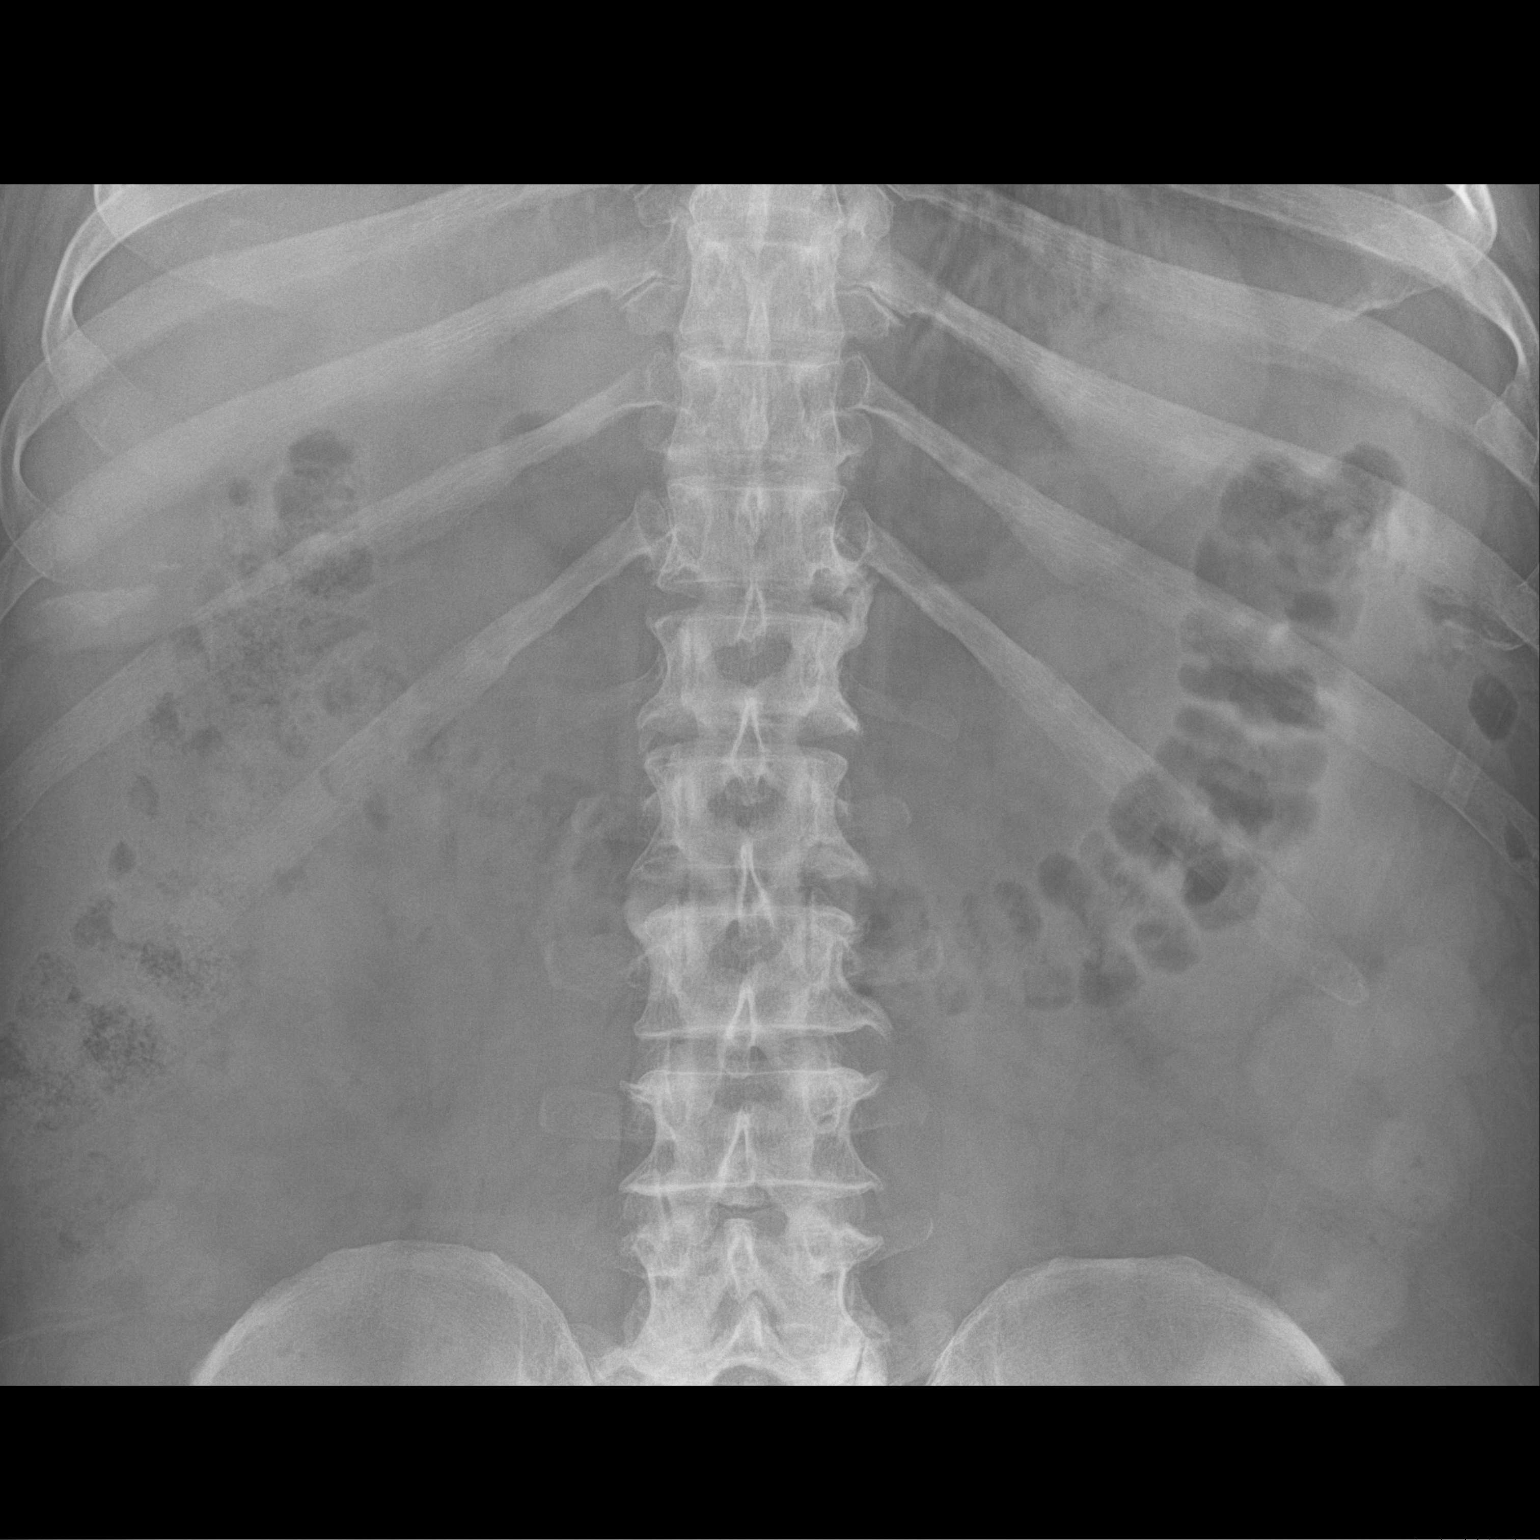

[2 of 2 positions shown; findings below may reference images not displayed]

FINDINGS: The previously noted calcifications at the right UPJ are no longer
present. Distal pelvic calcifications remain. This may include the
distal right ureteral stone on the right. No left-sided stones are
present. The bowel gas pattern is unremarkable. Degenerative changes
are again noted in the lower lumbar spine without change.
IMPRESSION: 1. Persistent calcifications within the anatomic pelvis. At least 1
of these stones remains concerning for distal ureteral stone third
2. Previously seen calcifications projected over the right renal
shadow are no longer present.

## 2017-03-04 DIAGNOSIS — J209 Acute bronchitis, unspecified: Secondary | ICD-10-CM | POA: Diagnosis not present

## 2017-03-08 ENCOUNTER — Other Ambulatory Visit: Payer: Self-pay | Admitting: Family Medicine

## 2017-04-14 DIAGNOSIS — K080 Exfoliation of teeth due to systemic causes: Secondary | ICD-10-CM | POA: Diagnosis not present

## 2017-05-12 DIAGNOSIS — K080 Exfoliation of teeth due to systemic causes: Secondary | ICD-10-CM | POA: Diagnosis not present

## 2017-06-13 ENCOUNTER — Other Ambulatory Visit: Payer: Self-pay | Admitting: Family Medicine

## 2017-06-23 ENCOUNTER — Other Ambulatory Visit: Payer: Self-pay | Admitting: Family Medicine

## 2017-07-14 HISTORY — PX: CARDIOVASCULAR STRESS TEST: SHX262

## 2017-07-23 ENCOUNTER — Encounter: Payer: Self-pay | Admitting: Family Medicine

## 2017-07-23 ENCOUNTER — Ambulatory Visit: Payer: Federal, State, Local not specified - PPO | Admitting: Family Medicine

## 2017-07-23 VITALS — BP 118/60 | HR 72 | Temp 98.0°F | Ht 71.0 in | Wt 239.5 lb

## 2017-07-23 DIAGNOSIS — E119 Type 2 diabetes mellitus without complications: Secondary | ICD-10-CM | POA: Diagnosis not present

## 2017-07-23 DIAGNOSIS — M109 Gout, unspecified: Secondary | ICD-10-CM

## 2017-07-23 DIAGNOSIS — R3912 Poor urinary stream: Secondary | ICD-10-CM

## 2017-07-23 DIAGNOSIS — N401 Enlarged prostate with lower urinary tract symptoms: Secondary | ICD-10-CM | POA: Diagnosis not present

## 2017-07-23 DIAGNOSIS — R0600 Dyspnea, unspecified: Secondary | ICD-10-CM

## 2017-07-23 DIAGNOSIS — R0609 Other forms of dyspnea: Secondary | ICD-10-CM | POA: Insufficient documentation

## 2017-07-23 DIAGNOSIS — E785 Hyperlipidemia, unspecified: Secondary | ICD-10-CM

## 2017-07-23 LAB — CBC WITH DIFFERENTIAL/PLATELET
BASOS ABS: 0 10*3/uL (ref 0.0–0.1)
Basophils Relative: 0.7 % (ref 0.0–3.0)
EOS ABS: 0.3 10*3/uL (ref 0.0–0.7)
Eosinophils Relative: 4.8 % (ref 0.0–5.0)
HEMATOCRIT: 49 % (ref 39.0–52.0)
HEMOGLOBIN: 16.6 g/dL (ref 13.0–17.0)
LYMPHS PCT: 28.4 % (ref 12.0–46.0)
Lymphs Abs: 1.7 10*3/uL (ref 0.7–4.0)
MCHC: 34 g/dL (ref 30.0–36.0)
MCV: 93.7 fl (ref 78.0–100.0)
MONOS PCT: 12.9 % — AB (ref 3.0–12.0)
Monocytes Absolute: 0.8 10*3/uL (ref 0.1–1.0)
Neutro Abs: 3.3 10*3/uL (ref 1.4–7.7)
Neutrophils Relative %: 53.2 % (ref 43.0–77.0)
PLATELETS: 202 10*3/uL (ref 150.0–400.0)
RBC: 5.22 Mil/uL (ref 4.22–5.81)
RDW: 13.2 % (ref 11.5–15.5)
WBC: 6.1 10*3/uL (ref 4.0–10.5)

## 2017-07-23 LAB — LIPID PANEL
CHOLESTEROL: 181 mg/dL (ref 0–200)
HDL: 37.8 mg/dL — ABNORMAL LOW (ref >39.00)
LDL Cholesterol: 121 mg/dL — ABNORMAL HIGH (ref 0–99)
NONHDL: 142.9
Total CHOL/HDL Ratio: 5
Triglycerides: 110 mg/dL (ref 0.0–149.0)
VLDL: 22 mg/dL (ref 0.0–40.0)

## 2017-07-23 LAB — MICROALBUMIN / CREATININE URINE RATIO
CREATININE, U: 217.6 mg/dL
MICROALB UR: 1 mg/dL (ref 0.0–1.9)
Microalb Creat Ratio: 0.5 mg/g (ref 0.0–30.0)

## 2017-07-23 LAB — COMPREHENSIVE METABOLIC PANEL
ALBUMIN: 4.1 g/dL (ref 3.5–5.2)
ALK PHOS: 72 U/L (ref 39–117)
ALT: 29 U/L (ref 0–53)
AST: 26 U/L (ref 0–37)
BILIRUBIN TOTAL: 0.7 mg/dL (ref 0.2–1.2)
BUN: 22 mg/dL (ref 6–23)
CO2: 31 mEq/L (ref 19–32)
Calcium: 9.4 mg/dL (ref 8.4–10.5)
Chloride: 104 mEq/L (ref 96–112)
Creatinine, Ser: 1.23 mg/dL (ref 0.40–1.50)
GFR: 63.15 mL/min (ref >60.00)
GLUCOSE: 114 mg/dL — AB (ref 70–99)
POTASSIUM: 4.5 meq/L (ref 3.5–5.1)
SODIUM: 140 meq/L (ref 135–145)
TOTAL PROTEIN: 7.1 g/dL (ref 6.0–8.3)

## 2017-07-23 LAB — BRAIN NATRIURETIC PEPTIDE: PRO B NATRI PEPTIDE: 9 pg/mL (ref 0.0–100.0)

## 2017-07-23 LAB — URIC ACID: Uric Acid, Serum: 5.5 mg/dL (ref 4.0–7.8)

## 2017-07-23 LAB — TSH: TSH: 1.05 u[IU]/mL (ref 0.35–4.50)

## 2017-07-23 LAB — HEMOGLOBIN A1C: HEMOGLOBIN A1C: 5.9 % (ref 4.6–6.5)

## 2017-07-23 MED ORDER — METFORMIN HCL 500 MG PO TABS: 500.0000 mg | ORAL_TABLET | Freq: Every day | ORAL | 0 refills | Status: DC

## 2017-07-23 MED ORDER — ALLOPURINOL 300 MG PO TABS: 300.0000 mg | ORAL_TABLET | Freq: Every day | ORAL | 0 refills | Status: DC

## 2017-07-23 MED ORDER — TAMSULOSIN HCL 0.4 MG PO CAPS: ORAL_CAPSULE | ORAL | 0 refills | Status: DC

## 2017-07-23 NOTE — Patient Instructions (Addendum)
EKG today. Labs today See our referral coordinators for cardiology referral.  Return at your convenience for physical.  Restart baby aspirin.  Avoid exertion until you see the cardiologist.

## 2017-07-23 NOTE — Assessment & Plan Note (Signed)
Check urate level.

## 2017-07-23 NOTE — Assessment & Plan Note (Signed)
Overdue for f/u. Update labs. Traditionally well controlled.

## 2017-07-23 NOTE — Assessment & Plan Note (Signed)
Update labs.  

## 2017-07-23 NOTE — Addendum Note (Signed)
Addended by: Brenton Grills on: 5/75/0518 33:58 AM   Modules accepted: Orders

## 2017-07-23 NOTE — Assessment & Plan Note (Signed)
New and progressive over the last year. Concern for cardiac cause. Check EKG today, labs today, will refer for expedited cardiology evaluation. rec start aspirin daily. rec avoid strenuous activity until heart evaluation. Pt agrees with plan.

## 2017-07-23 NOTE — Progress Notes (Signed)
BP 118/60 (BP Location: Left Arm, Patient Position: Sitting, Cuff Size: Normal)   Pulse 72   Temp 98 F (36.7 C) (Oral)   Ht 5\' 11"  (1.803 m)   Wt 239 lb 8 oz (108.6 kg)   SpO2 96%   BMI 33.40 kg/m    CC: fatigue, dyspnea Subjective:    Patient ID: Richard Case, male    DOB: 1955-08-08, 62 y.o.   MRN: [id number]  HPI: Richard Case is a 62 y.o. male presenting on 07/23/2017 for Fatigue (States he has been feeling tired over the last few months and is worsening. Really loses energy when climbing stairs. States he has sleep apnea and has not used CPAP in awhile. ) and Referral (Requestst referral to cardio. Has not had stress test in awhile. )   Last seen 03/2016 for CPE.   Not feeling well over the past year. More noticeable with exertion - [place] Mainly notices dyspnea and racing heart while walking up hill or upstairs. Also notices when push mowing the lawn. Denies chest pain/tightness, significant cough or wheezing.   OSA - dx 2000s, started on CPAP but never able to tolerate for prolonged periods of time.  H/o atrial ectopy that improved with CPAP use.  fmhx MI (father age 19s, s/p bypass).   Relevant past medical, surgical, family and social history reviewed and updated as indicated. Interim medical history since our last visit reviewed. Allergies and medications reviewed and updated. Outpatient Medications Prior to Visit  Medication Sig Dispense Refill  . ibuprofen (ADVIL,MOTRIN) 200 MG tablet Take 400 mg by mouth every 6 (six) hours as needed for moderate pain.    Marland Kitchen scopolamine (TRANSDERM-SCOP, 1.5 MG,) 1 MG/3DAYS Place 1 patch (1.5 mg total) onto the skin every 3 (three) days. 4 patch 0  . allopurinol (ZYLOPRIM) 300 MG tablet TAKE 1 TABLET (300 MG TOTAL) BY MOUTH DAILY. 90 tablet 0  . metFORMIN (GLUCOPHAGE) 500 MG tablet TAKE 1 TABLET BY MOUTH EVERY DAY WITH BREAKFAST. NEED APPT FOR  FURTHER REFILLS 90 tablet 0  . tamsulosin (FLOMAX) 0.4 MG CAPS capsule TAKE 1 CAPSULE (0.4 MG TOTAL) BY MOUTH DAILY. 90 capsule 3  . aspirin 81 MG chewable tablet Chew 1 tablet (81 mg total) by mouth daily.    Marland Kitchen allopurinol (ZYLOPRIM) 300 MG tablet TAKE 1 TABLET (300 MG TOTAL) BY MOUTH DAILY. 90 tablet 0  . aspirin 81 MG chewable tablet Chew 81 mg by mouth every Monday, Wednesday, and Friday.    . metFORMIN (GLUCOPHAGE) 500 MG tablet TAKE 1 TABLET BY MOUTH EVERY DAY WITH BREAKFAST. NEED APPT FOR FURTHER REFILLS 90 tablet 0   No facility-administered medications prior to visit.      Per HPI unless specifically indicated in ROS section below Review of Systems     Objective:    BP 118/60 (BP Location: Left Arm, Patient Position: Sitting, Cuff Size: Normal)   Pulse 72   Temp 98 F (36.7 C) (Oral)   Ht 5\' 11"  (1.803 m)   Wt 239 lb 8 oz (108.6 kg)   SpO2 96%   BMI 33.40 kg/m   Wt Readings from Last 3 Encounters:  07/23/17 239 lb 8 oz (108.6 kg)  09/11/16 239 lb 4.8 oz (108.5 kg)  04/04/16 240 lb (108.9 kg)    Physical Exam  Constitutional: He appears well-developed and well-nourished. No distress.  HENT:  Mouth/Throat: Oropharynx is clear and  moist. No oropharyngeal exudate.  Eyes: Pupils are equal, round, and reactive to light. EOM are normal.  Neck: Normal range of motion. Neck supple.  Cardiovascular: Normal rate, regular rhythm and normal heart sounds.  No murmur heard. Pulmonary/Chest: Effort normal and breath sounds normal. No respiratory distress. He has no wheezes. He has no rales.  Musculoskeletal: He exhibits no edema.  Lymphadenopathy:    He has no cervical adenopathy.  Psychiatric: He has a normal mood and affect.  Nursing note and vitals reviewed.  Lab Results  Component Value Date   HGBA1C 5.9 04/01/2016    Lab Results  Component Value Date   CHOL 162 04/01/2016   HDL 38.20 (L) 04/01/2016   LDLCALC 107 (H) 04/01/2016   LDLDIRECT 118.0 03/22/2015   TRIG  81.0 04/01/2016   CHOLHDL 4 04/01/2016       Assessment & Plan:   Problem List Items Addressed This Visit    HLD (hyperlipidemia)    Update labs.       Relevant Medications   aspirin 81 MG chewable tablet   Other Relevant Orders   Comprehensive metabolic panel   Lipid panel   Gout    Check urate level.       Relevant Orders   Uric acid   Exertional dyspnea - Primary    New and progressive over the last year. Concern for cardiac cause. Check EKG today, labs today, will refer for expedited cardiology evaluation. rec start aspirin daily. rec avoid strenuous activity until heart evaluation. Pt agrees with plan.       Relevant Orders   Comprehensive metabolic panel   TSH   CBC with Differential/Platelet   Ambulatory referral to Cardiology   Brain natriuretic peptide   Controlled diabetes mellitus type II without complication ([place])    Overdue for f/u. Update labs. Traditionally well controlled.       Relevant Medications   metFORMIN (GLUCOPHAGE) 500 MG tablet   aspirin 81 MG chewable tablet   Other Relevant Orders   Hemoglobin A1c   Microalbumin / creatinine urine ratio   BPH (benign prostatic hyperplasia)   Relevant Medications   tamsulosin (FLOMAX) 0.4 MG CAPS capsule       Meds ordered this encounter  Medications  . metFORMIN (GLUCOPHAGE) 500 MG tablet    Sig: Take 1 tablet (500 mg total) by mouth daily with breakfast.    Dispense:  90 tablet    Refill:  0    Needs annual physical appointment for further refills  . tamsulosin (FLOMAX) 0.4 MG CAPS capsule    Sig: TAKE 1 CAPSULE (0.4 MG TOTAL) BY MOUTH DAILY.    Dispense:  90 capsule    Refill:  0  . allopurinol (ZYLOPRIM) 300 MG tablet    Sig: Take 1 tablet (300 mg total) by mouth daily.    Dispense:  90 tablet    Refill:  0   Orders Placed This Encounter  Procedures  . Comprehensive metabolic panel  . Lipid panel  . TSH  . Hemoglobin A1c  . CBC with Differential/Platelet  . Uric acid  . Brain  natriuretic peptide  . Microalbumin / creatinine urine ratio  . Ambulatory referral to Cardiology    Referral Priority:   Urgent    Referral Type:   Consultation    Referral Reason:   Specialty Services Required    Requested Specialty:   Cardiology    Number of Visits Requested:   1    Follow up plan:  Return in about 3 months (around 10/23/2017) for annual exam, prior fasting for blood work.  [name], MD

## 2017-07-24 ENCOUNTER — Ambulatory Visit: Payer: Federal, State, Local not specified - PPO | Admitting: Cardiovascular Disease

## 2017-07-24 ENCOUNTER — Encounter: Payer: Self-pay | Admitting: Cardiovascular Disease

## 2017-07-24 VITALS — BP 132/83 | Ht 71.0 in | Wt 240.5 lb

## 2017-07-24 DIAGNOSIS — R0600 Dyspnea, unspecified: Secondary | ICD-10-CM

## 2017-07-24 DIAGNOSIS — R002 Palpitations: Secondary | ICD-10-CM

## 2017-07-24 DIAGNOSIS — E785 Hyperlipidemia, unspecified: Secondary | ICD-10-CM | POA: Diagnosis not present

## 2017-07-24 DIAGNOSIS — R0609 Other forms of dyspnea: Secondary | ICD-10-CM

## 2017-07-24 NOTE — Progress Notes (Signed)
Cardiology Office Note   Date:  07/24/2017   ID:  Richard Case, DOB 1955/09/08, MRN [id number]  PCP:  [name], MD  Cardiologist:   Kathlyn Sacramento, MD   Chief Complaint  Patient presents with  . other    SOB with exertion and rapid heart beat. Pt refused EKG bc he had one yesterday. Meds reviewed verbally with pt.      History of Present Illness: Richard Case is a 62 y.o. male who was referred by [name] for evaluation of exertional dyspnea and palpitations.  The patient reports that he was told about a heart murmur in the 90s without investigations.  He has known history of type 2 diabetes, mild hyperlipidemia, sleep apnea not on CPAP and obesity.  He is not a smoker.  He is not aware of family history of coronary artery disease but reports that his father had some kind of cardiac surgery to replace a blood vessel. Over the last year, he has experienced progressive exertional dyspnea associated with palpitations and tachycardia.  He denies any chest pain.  No orthopnea, PND or leg edema.  There has been no change in his weight.  He does not exercise on a regular basis.  His symptoms became noticeable [place].  He reports having a stress test more than 15 years ago in [place] which was unremarkable.  No recent cardiac evaluation. He does have known history of sleep apnea but not able to tolerate a CPAP.    Past Medical History:  Diagnosis Date  . ADD (attention deficit disorder) 2011   no records of workup; strattera caused urinary retention, not interested in habit forming medication  . Atrial ectopy 2011   improved with CPAP, documented by holter  . Benign colon polyp ?2014   hyperplastic  . BPH (benign prostatic hypertrophy)    per prior PCP records  . Elevated blood pressure (not hypertension)   . Erectile dysfunction   . GERD (gastroesophageal reflux disease)   . Gout   . Heart murmur  longstanding  . History of kidney stones 2008, 2017  . History of pneumonia 2009  . HLD (hyperlipidemia)   . Kidney stones    HX  . Male erectile dysfunction   . Obesity, Class I, BMI 30-34.9   . OSA (obstructive sleep apnea) 2010   OSA with stabilization at CPAP 7cm  . Peyronie disease    per prior pcp records  . Polycythemia    ?OSA related  . Prediabetes   . Rosacea    per prior pcp records  . Seasonal allergic rhinitis   . 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 COLONOSCOPY  2009   rec rpt 5 yrs per prior PCP records but no actual report  . COLONOSCOPY  03/2012   mild diverticulosis, rpt 5 yrs (Dr Hulan Saas in [place])  . EXTRACORPOREAL SHOCK WAVE LITHOTRIPSY Right 04/06/2015   Procedure: EXTRACORPOREAL SHOCK WAVE LITHOTRIPSY (ESWL);  Surgeon: Nickie Retort, MD;  Location: [facility] ORS;  Service: Urology;  Laterality: Right;  . MOHS SURGERY  2015   SCC of nose  . TONSILLECTOMY       Current Outpatient Medications  Medication Sig Dispense Refill  . allopurinol (ZYLOPRIM) 300 MG tablet Take 1 tablet (300 mg total) by mouth daily. 90 tablet 0  . ibuprofen (ADVIL,MOTRIN)  200 MG tablet Take 400 mg by mouth every 6 (six) hours as needed for moderate pain.    . metFORMIN (GLUCOPHAGE) 500 MG tablet Take 1 tablet (500 mg total) by mouth daily with breakfast. 90 tablet 0  . scopolamine (TRANSDERM-SCOP, 1.5 MG,) 1 MG/3DAYS Place 1 patch (1.5 mg total) onto the skin every 3 (three) days. 4 patch 0  . tamsulosin (FLOMAX) 0.4 MG CAPS capsule TAKE 1 CAPSULE (0.4 MG TOTAL) BY MOUTH DAILY. 90 capsule 0  . aspirin 81 MG chewable tablet Chew 1 tablet (81 mg total) by mouth daily.     No current facility-administered medications for this visit.     Allergies:   Strattera [atomoxetine hcl]    Social History:  The patient  reports that he has never smoked. He has never used smokeless tobacco. He reports that he  does not drink alcohol or use drugs.   Family History:  The patient's family history includes CAD in his father; Cancer in his mother; Cancer (age of onset: 59) in his father; Heart Problems in his father; Lung disease in his mother; Prostate cancer in his father.    ROS:  Please see the history of present illness.   Otherwise, review of systems are positive for none.   All other systems are reviewed and negative.    PHYSICAL EXAM: VS:  BP 132/83 (BP Location: Right Arm, Patient Position: Sitting, Cuff Size: Large)   Ht 5\' 11"  (1.803 m)   Wt 240 lb 8 oz (109.1 kg)   BMI 33.54 kg/m  , BMI Body mass index is 33.54 kg/m. GEN: Well nourished, well developed, in no acute distress  HEENT: normal  Neck: no JVD, carotid bruits, or masses Cardiac: RRR; no murmurs, rubs, or gallops,no edema  Respiratory:  clear to auscultation bilaterally, normal work of breathing GI: soft, nontender, nondistended, + BS MS: no deformity or atrophy  Skin: warm and dry, no rash Neuro:  Strength and sensation are intact Psych: euthymic mood, full affect   EKG:  EKG is not ordered today. I reviewed his EKG from yesterday which showed sinus rhythm with nonspecific T wave changes.   Recent Labs: 07/23/2017: ALT 29; BUN 22; Creatinine, Ser 1.23; Hemoglobin 16.6; Platelets 202.0; Potassium 4.5; Pro B Natriuretic peptide (BNP) 9.0; Sodium 140; TSH 1.05    Lipid Panel    Component Value Date/Time   CHOL 181 07/23/2017 0853   CHOL 187 05/04/2013   TRIG 110.0 07/23/2017 0853   TRIG 126 05/04/2013   HDL 37.80 (L) 07/23/2017 0853   CHOLHDL 5 07/23/2017 0853   VLDL 22.0 07/23/2017 0853   LDLCALC 121 (H) 07/23/2017 0853   LDLCALC 123 05/04/2013   LDLDIRECT 118.0 03/22/2015 0745      Wt Readings from Last 3 Encounters:  07/24/17 240 lb 8 oz (109.1 kg)  07/23/17 239 lb 8 oz (108.6 kg)  09/11/16 239 lb 4.8 oz (108.5 kg)       PAD Screen 07/24/2017  Previous PAD dx? No  Previous surgical procedure? No    Pain with walking? Yes  Subsides with rest? Yes  Feet/toe relief with dangling? No  Painful, non-healing ulcers? No  Extremities discolored? No      ASSESSMENT AND PLAN:  1.  Exertional dyspnea: Given his risk factors, we have to exclude underlying ischemic heart disease.  He has known history of type 2 diabetes and hyperlipidemia.  I recommend evaluation with a treadmill nuclear stress test. We also have to evaluate for possible  diastolic heart failure and pulmonary hypertension given his obesity and underlying sleep apnea.  I requested an echocardiogram. It is possible that some of his symptoms might be related to physical deconditioning.  However, this is a diagnosis of exclusion. If cardiac testing is unremarkable, advised him to start an exercise program and attempt weight loss.  2.  Hyperlipidemia: Recent lipid profile showed an LDL of 121.  Given that he is diabetic, consider treatment with a statin.  3.  Elevated blood pressure without history of hypertension: Continue to monitor closely and consider treatment if needed.  4.  Palpitations: His symptoms are suggestive of sinus tachycardia with physical activities.  Will evaluate heart rate response to exercise during stress test.    Disposition:   FU with me in 2 months  Signed,  Kathlyn Sacramento, MD  07/24/2017 3:25 PM    [place]

## 2017-07-24 NOTE — Patient Instructions (Addendum)
Medication Instructions:  Your physician recommends that you continue on your current medications as directed. Please refer to the Current Medication list given to you today.   Labwork: none  Testing/Procedures: Your physician has requested that you have an echocardiogram. Echocardiography is a painless test that uses sound waves to create images of your heart. It provides your doctor with information about the size and shape of your heart and how well your heart's chambers and valves are working. This procedure takes approximately one hour. There are no restrictions for this procedure. You may get an IV, if needed, to receive an ultrasound enhancing agent through to better visualize your heart.    [place]  Your caregiver has ordered a Stress Test with nuclear imaging. The purpose of this test is to evaluate the blood supply to your heart muscle. This procedure is referred to as a "Non-Invasive Stress Test." This is because other than having an IV started in your vein, nothing is inserted or "invades" your body. Cardiac stress tests are done to find areas of poor blood flow to the heart by determining the extent of coronary artery disease (CAD). Some patients exercise on a treadmill, which naturally increases the blood flow to your heart, while others who are  unable to walk on a treadmill due to physical limitations have a pharmacologic/chemical stress agent called Lexiscan . This medicine will mimic walking on a treadmill by temporarily increasing your coronary blood flow.   Please note: these test may take anywhere between 2-4 hours to complete  PLEASE REPORT TO [place] AT THE FIRST DESK WILL DIRECT YOU WHERE TO GO  Date of Procedure:_____________________________________  Arrival Time for Procedure:______________________________  Instructions regarding medication:   ____ : Hold diabetes medication (METFORMIN)  morning of procedure   PLEASE  NOTIFY THE OFFICE AT LEAST 24 HOURS IN ADVANCE IF YOU ARE UNABLE TO KEEP YOUR APPOINTMENT.  [phone] AND  PLEASE NOTIFY NUCLEAR MEDICINE AT [facility] AT LEAST 24 HOURS IN ADVANCE IF YOU ARE UNABLE TO KEEP YOUR APPOINTMENT. [phone]  How to prepare for your Myoview test:  1. Do not eat or drink after midnight 2. No caffeine for 24 hours prior to test 3. No smoking 24 hours prior to test. 4. Your medication may be taken with water.  If your doctor stopped a medication because of this test, do not take that medication. 5. Ladies, please do not wear dresses.  Skirts or pants are appropriate. Please wear a short sleeve shirt. 6. No perfume, cologne or lotion. 7. Wear comfortable walking shoes. No heels!   Follow-Up: Your physician recommends that you schedule a follow-up appointment in: [place].    Cardiac Nuclear Scan A cardiac nuclear scan is a test that measures blood flow to the heart when a person is resting and when he or she is exercising. The test looks for problems such as:  Not enough blood reaching a portion of the heart.  The heart muscle not working normally.  You may need this test if:  You have heart disease.  You have had abnormal lab results.  You have had heart surgery or angioplasty.  You have chest pain.  You have shortness of breath.  In this test, a radioactive dye (tracer) is injected into your bloodstream. After the tracer has traveled to your heart, an imaging device is used to measure how much of the tracer is absorbed by or distributed to various areas of your  heart. This procedure is usually done at a hospital and takes 2-4 hours. Tell a health care provider about:  Any allergies you have.  All medicines you are taking, including vitamins, herbs, eye drops, creams, and over-the-counter medicines.  Any problems you or family members have had with the use of anesthetic medicines.  Any blood disorders you have.  Any surgeries you  have had.  Any medical conditions you have.  Whether you are pregnant or may be pregnant. What are the risks? Generally, this is a safe procedure. However, problems may occur, including:  Serious chest pain and heart attack. This is only a risk if the stress portion of the test is done.  Rapid heartbeat.  Sensation of warmth in your chest. This usually passes quickly.  What happens before the procedure?  Ask your health care provider about changing or stopping your regular medicines. This is especially important if you are taking diabetes medicines or blood thinners.  Remove your jewelry on the day of the procedure. What happens during the procedure?  An IV tube will be inserted into one of your veins.  Your health care provider will inject a small amount of radioactive tracer through the tube.  You will wait for 20-40 minutes while the tracer travels through your bloodstream.  Your heart activity will be monitored with an electrocardiogram (ECG).  You will lie down on an exam table.  Images of your heart will be taken for about 15-20 minutes.  You may be asked to exercise on a treadmill or stationary bike. While you exercise, your heart's activity will be monitored with an ECG, and your blood pressure will be checked. If you are unable to exercise, you may be given a medicine to increase blood flow to parts of your heart.  When blood flow to your heart has peaked, a tracer will again be injected through the IV tube.  After 20-40 minutes, you will get back on the exam table and have more images taken of your heart.  When the procedure is over, your IV tube will be removed. The procedure may vary among health care providers and hospitals. Depending on the type of tracer used, scans may need to be repeated 3-4 hours later. What happens after the procedure?  Unless your health care provider tells you otherwise, you may return to your normal schedule, including diet,  activities, and medicines.  Unless your health care provider tells you otherwise, you may increase your fluid intake. This will help flush the contrast dye from your body. Drink enough fluid to keep your urine clear or pale yellow.  It is up to you to get your test results. Ask your health care provider, or the department that is doing the test, when your results will be ready. Summary  A cardiac nuclear scan measures the blood flow to the heart when a person is resting and when he or she is exercising.  You may need this test if you are at risk for heart disease.  Tell your health care provider if you are pregnant.  Unless your health care provider tells you otherwise, increase your fluid intake. This will help flush the contrast dye from your body. Drink enough fluid to keep your urine clear or pale yellow. This information is not intended to replace advice given to you by your health care provider. Make sure you discuss any questions you have with your health care provider. Document Released: 01/26/2004 Document Revised: 01/03/2016 Document Reviewed: 12/09/2012 Elsevier Interactive Patient  Education  2017 [employer]     Echocardiogram An echocardiogram, or echocardiography, uses sound waves (ultrasound) to produce an image of your heart. The echocardiogram is simple, painless, obtained within a short period of time, and offers valuable information to your health care provider. The images from an echocardiogram can provide information such as:  Evidence of coronary artery disease (CAD).  Heart size.  Heart muscle function.  Heart valve function.  Aneurysm detection.  Evidence of a past heart attack.  Fluid buildup around the heart.  Heart muscle thickening.  Assess heart valve function.  Tell a health care provider about:  Any allergies you have.  All medicines you are taking, including vitamins, herbs, eye drops, creams, and over-the-counter medicines.  Any  problems you or family members have had with anesthetic medicines.  Any blood disorders you have.  Any surgeries you have had.  Any medical conditions you have.  Whether you are pregnant or may be pregnant. What happens before the procedure? No special preparation is needed. Eat and drink normally. What happens during the procedure?  In order to produce an image of your heart, gel will be applied to your chest and a wand-like tool (transducer) will be moved over your chest. The gel will help transmit the sound waves from the transducer. The sound waves will harmlessly bounce off your heart to allow the heart images to be captured in real-time motion. These images will then be recorded.  You may need an IV to receive a medicine that improves the quality of the pictures. What happens after the procedure? You may return to your normal schedule including diet, activities, and medicines, unless your health care provider tells you otherwise. This information is not intended to replace advice given to you by your health care provider. Make sure you discuss any questions you have with your health care provider. Document Released: 12/29/1999 Document Revised: 08/19/2015 Document Reviewed: 09/07/2012 Elsevier Interactive Patient Education  2017 [employer].

## 2017-08-05 ENCOUNTER — Encounter
Admission: RE | Admit: 2017-08-05 | Discharge: 2017-08-05 | Disposition: A | Discharge status: Home / Self Care | Payer: Federal, State, Local not specified - PPO | Source: Ambulatory Visit | Attending: Cardiovascular Disease | Admitting: Cardiovascular Disease

## 2017-08-05 ENCOUNTER — Ambulatory Visit (INDEPENDENT_AMBULATORY_CARE_PROVIDER_SITE_OTHER): Payer: Federal, State, Local not specified - PPO

## 2017-08-05 ENCOUNTER — Other Ambulatory Visit: Payer: Self-pay

## 2017-08-05 DIAGNOSIS — R0600 Dyspnea, unspecified: Secondary | ICD-10-CM

## 2017-08-05 DIAGNOSIS — R0609 Other forms of dyspnea: Secondary | ICD-10-CM

## 2017-08-05 LAB — NM MYOCAR MULTI W/SPECT W/WALL MOTION / EF
CHL CUP MPHR: 157 {beats}/min
CSEPED: 8 min
CSEPHR: 107 %
CSEPPHR: 169 {beats}/min
Estimated workload: 10.4 METS
Exercise duration (sec): 35 s
LV dias vol: 65 mL (ref 62–150)
LV sys vol: 21 mL
Rest HR: 82 {beats}/min
TID: 0.79

## 2017-08-05 MED ORDER — TECHNETIUM TC 99M TETROFOSMIN IV KIT
28.6700 | PACK | Freq: Once | INTRAVENOUS | Status: AC | PRN
  Administered 2017-08-05: 28.67 via INTRAVENOUS

## 2017-08-05 MED ORDER — TECHNETIUM TC 99M TETROFOSMIN IV KIT
10.0000 | PACK | Freq: Once | INTRAVENOUS | Status: AC | PRN
  Administered 2017-08-05: 14 via INTRAVENOUS

## 2017-08-12 ENCOUNTER — Telehealth: Payer: Self-pay | Admitting: Cardiovascular Disease

## 2017-08-12 NOTE — Telephone Encounter (Signed)
New Message          Patient returned your call and would like an e-mail from on the reason for the call please

## 2017-08-12 NOTE — Telephone Encounter (Signed)
Email per the request of the patient was sent to him with his echo and stress test results.

## 2017-08-13 ENCOUNTER — Encounter: Payer: Self-pay | Admitting: Family Medicine

## 2017-08-29 ENCOUNTER — Other Ambulatory Visit: Payer: Self-pay | Admitting: Family Medicine

## 2017-08-29 DIAGNOSIS — N400 Enlarged prostate without lower urinary tract symptoms: Secondary | ICD-10-CM

## 2017-09-02 ENCOUNTER — Telehealth: Payer: Self-pay | Admitting: Urology

## 2017-09-02 ENCOUNTER — Ambulatory Visit
Admission: RE | Admit: 2017-09-02 | Discharge: 2017-09-02 | Disposition: A | Discharge status: Home / Self Care | Payer: Federal, State, Local not specified - PPO | Source: Ambulatory Visit | Attending: Urology | Admitting: Urology

## 2017-09-02 ENCOUNTER — Other Ambulatory Visit: Payer: Federal, State, Local not specified - PPO

## 2017-09-02 ENCOUNTER — Ambulatory Visit: Payer: Federal, State, Local not specified - PPO

## 2017-09-02 DIAGNOSIS — N281 Cyst of kidney, acquired: Secondary | ICD-10-CM | POA: Diagnosis not present

## 2017-09-02 DIAGNOSIS — N400 Enlarged prostate without lower urinary tract symptoms: Secondary | ICD-10-CM | POA: Diagnosis not present

## 2017-09-02 NOTE — Telephone Encounter (Signed)
Pt wanted to speak with you about cyst or tumor around urethra area and wants to know if he needs to do anything in advance before appt with you on 9/3.  Please call pt [phone]

## 2017-09-02 NOTE — Telephone Encounter (Signed)
Patient states he is feeling bumps underneath the skin of his penis and would like to be seen.  He is scheduled with Dr. Diamantina Providence on 09/10/2017.

## 2017-09-03 LAB — PSA: Prostate Specific Ag, Serum: 1.7 ng/mL (ref 0.0–4.0)

## 2017-09-09 ENCOUNTER — Ambulatory Visit: Payer: Federal, State, Local not specified - PPO | Admitting: Urology

## 2017-09-10 ENCOUNTER — Encounter: Payer: Self-pay | Admitting: Urology

## 2017-09-10 ENCOUNTER — Ambulatory Visit: Payer: Federal, State, Local not specified - PPO | Admitting: Urology

## 2017-09-10 VITALS — BP 144/80 | HR 94 | Ht 71.0 in | Wt 242.0 lb

## 2017-09-10 DIAGNOSIS — N486 Induration penis plastica: Secondary | ICD-10-CM | POA: Diagnosis not present

## 2017-09-10 NOTE — Progress Notes (Signed)
   09/10/2017 1:58 PM   Richard Case 12/03/1955 [id number]  Reason for visit: Possible penile lesion  HPI: I am seeing Richard Case in urology clinic today for opinion on possible penile lesion.  Briefly he is a 62 year old male who is followed by our PA Zara Council for history of nephrolithiasis, complex renal cyst, BPH, PSA surveillance, and Peyronie's disease.  He reports he can sometimes feel a very small mass on the right base of his penis that is "slightly bigger than a grain of sand."  He also reports when he has erections he has a 90 degrees ventral curvature.  He is not sexually active as his wife has numerous health issues currently.  Denies any urethral discharge or penile pain.   ROS: Please see flowsheet from today's date for complete review of systems.  Physical Exam: BP (!) 144/80 (BP Location: Left Arm, Patient Position: Sitting, Cuff Size: Normal)   Pulse 94   Ht 5\' 11"  (1.803 m)   Wt 242 lb (109.8 kg)   BMI 33.75 kg/m    Constitutional:  Alert and oriented, No acute distress. Respiratory: Normal respiratory effort, no increased work of breathing. GI: Abdomen is soft, nontender, nondistended, no abdominal masses GU: Uncircumcised  phallus without lesions, widely patent meatus.  I am unable to palpate any abnormality of the penis.  No palpable plaques. Skin: No rashes, bruises or suspicious lesions. Neurologic: Grossly intact, no focal deficits, moving all 4 extremities. Psychiatric: Normal mood and affect   Assessment & Plan:   In summary, Richard Case is a 62 year old male here today to discuss possible penile lesion.  Neither the patient nor myself are able to appreciate any mass or abnormalities on penile exam today.  There are no palpable penile plaques.  He is not interested in any further treatment for his Peyronie's disease at this time, as he is not sexually active.  I provided reassurance that this is unlikely to represent an underlying malignant  lesion.  Keep scheduled follow-up with Zara Council, PA for other urologic concerns.  [place]ley Co, [name] [address], [place], [place] [address] [phone]

## 2017-09-16 ENCOUNTER — Ambulatory Visit: Payer: Federal, State, Local not specified - PPO | Admitting: Urology

## 2017-09-16 ENCOUNTER — Encounter: Payer: Self-pay | Admitting: Urology

## 2017-09-16 ENCOUNTER — Other Ambulatory Visit: Payer: Self-pay

## 2017-09-16 VITALS — BP 123/82 | HR 101 | Ht 71.0 in | Wt 239.6 lb

## 2017-09-16 DIAGNOSIS — R3912 Poor urinary stream: Secondary | ICD-10-CM

## 2017-09-16 DIAGNOSIS — N281 Cyst of kidney, acquired: Secondary | ICD-10-CM

## 2017-09-16 DIAGNOSIS — N138 Other obstructive and reflux uropathy: Secondary | ICD-10-CM

## 2017-09-16 DIAGNOSIS — N401 Enlarged prostate with lower urinary tract symptoms: Secondary | ICD-10-CM | POA: Diagnosis not present

## 2017-09-16 DIAGNOSIS — Z87442 Personal history of urinary calculi: Secondary | ICD-10-CM | POA: Diagnosis not present

## 2017-09-16 MED ORDER — TAMSULOSIN HCL 0.4 MG PO CAPS: ORAL_CAPSULE | ORAL | 3 refills | Status: DC

## 2017-09-16 NOTE — Progress Notes (Signed)
2:16 PM   Richard Case 05/01/1955 [id number]  Referring provider: [name], MD [address] [place], [place] [address]  Chief Complaint  Patient presents with  . Nephrolithiasis    HPI: Patient is a 62 year old Caucasian male with a history nephrolithiasis, complex renal cyst and BPH with LU TS who presents today to for one year follow up.      History of nephrolithiasis  CT scan noted a right UVJ (5 mm x 4 mm x 4 mm) stone causing moderate right hydronephrosis and moderate right hydroureter.  Patient underwent right ESWL for definitive treatment of his UVJ stone.  Patient's stone analysis revealed a stone composition of 93% calcium oxalate monohydrate, 5% calcium oxalate dihydrate and 2% calcium phosphate carbonate.  His renal ultrasound from 05/19/2015 noted no residual hydronephrosis. No sonographic evidence of nephrolithiasis. Bilateral ureteral jets in the bladder. Minimally complex right renal cyst. Simple left renal cysts. No suspicious renal masses.   Complex renal cyst Contrast CT scan 08/2015 noted small benign left renal cysts and probable tiny subcentimeter cyst in the right kidney which are too small to characterize. No definite evidence of a renal neoplasm. He also had a tiny nonobstructive left renal calculus without evidence of hydronephrosis. Hepatic stenosis. Cholelithiasis. Colonic diverticulosis and aortic atherosclerosis.  RUS performed on 08/29/2016 noted small renal cysts in each kidney. A septation is seen in the small right renal cyst. Extrarenal pelvis on the right. Study otherwise unremarkable.  RUS in 08/2017 revealed small simple and minimally complex renal cysts without appreciable interval change. No suspicious renal masses.  Normal bladder.   BPH WITH LUTS  (prostate and/or bladder) IPSS score: 16/3   Previous score: 10/3  Previous PVR: 93 mL  Major complaint(s):  Urgency and intermittency x few years. Denies any dysuria, hematuria or  suprapubic pain.   Currently taking: tamsulosin 0.4 mg daily   Denies any recent fevers, chills, nausea or vomiting.  His father had prostate cancer, but more likely died from lung cancer.    IPSS    Row Name 09/16/17 1300         International Prostate Symptom Score   How often have you had the sensation of not emptying your bladder?  About half the time     How often have you had to urinate less than every two hours?  About half the time     How often have you found you stopped and started again several times when you urinated?  Less than 1 in 5 times     How often have you found it difficult to postpone urination?  More than half the time     How often have you had a weak urinary stream?  Less than half the time     How often have you had to strain to start urination?  Less than half the time     How many times did you typically get up at night to urinate?  1 Time     Total IPSS Score  16       Quality of Life due to urinary symptoms   If you were to spend the rest of your life with your urinary condition just the way it is now how would you feel about that?  Mixed        Score:  1-7 Mild 8-19 Moderate 20-35 Severe    PMH: Past Medical History:  Diagnosis Date  . ADD (attention deficit disorder) 2011  no records of workup; strattera caused urinary retention, not interested in habit forming medication  . Atrial ectopy 2011   improved with CPAP, documented by holter  . Benign colon polyp ?2014   hyperplastic  . BPH (benign prostatic hypertrophy)    per prior PCP records  . Elevated blood pressure (not hypertension)   . Erectile dysfunction   . GERD (gastroesophageal reflux disease)   . Gout   . Heart murmur longstanding  . History of kidney stones 2008, 2017  . History of pneumonia 2009  . HLD (hyperlipidemia)   . Kidney stones    HX  . Male erectile dysfunction   . Obesity, Class I, BMI 30-34.9   . OSA (obstructive sleep apnea) 2010   OSA with  stabilization at CPAP 7cm  . Peyronie disease    per prior pcp records  . Polycythemia    ?OSA related  . Prediabetes   . Rosacea    per prior pcp records  . Seasonal allergic rhinitis   . 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 CARDIOVASCULAR STRESS TEST  07/2017   low risk stress test, hypertensive response (End)  . COLONOSCOPY  2009   rec rpt 5 yrs per prior PCP records but no actual report  . COLONOSCOPY  03/2012   mild diverticulosis, rpt 5 yrs (Dr Hulan Saas in [place])  . EXTRACORPOREAL SHOCK WAVE LITHOTRIPSY Right 04/06/2015   Procedure: EXTRACORPOREAL SHOCK WAVE LITHOTRIPSY (ESWL);  Surgeon: Nickie Retort, MD;  Location: [facility] ORS;  Service: Urology;  Laterality: Right;  . MOHS SURGERY  2015   SCC of nose  . TONSILLECTOMY      Home Medications:  Allergies as of 09/16/2017      Reactions   Strattera [atomoxetine Hcl] Other (See Comments)   Urinary retention      Medication List        Accurate as of 09/16/17  2:16 PM. Always use your most recent med list.          allopurinol 300 MG tablet Commonly known as:  ZYLOPRIM Take 1 tablet (300 mg total) by mouth daily.   aspirin 81 MG chewable tablet Chew 1 tablet (81 mg total) by mouth daily.   ibuprofen 200 MG tablet Commonly known as:  ADVIL,MOTRIN Take 400 mg by mouth every 6 (six) hours as needed for moderate pain.   metFORMIN 500 MG tablet Commonly known as:  GLUCOPHAGE Take 1 tablet (500 mg total) by mouth daily with breakfast.   scopolamine 1 MG/3DAYS Commonly known as:  TRANSDERM-SCOP Place 1 patch (1.5 mg total) onto the skin every 3 (three) days.   tamsulosin 0.4 MG Caps capsule Commonly known as:  FLOMAX TAKE 1 CAPSULE (0.4 MG TOTAL) BY MOUTH DAILY.       Allergies:  Allergies  Allergen Reactions  . Strattera [Atomoxetine Hcl] Other (See Comments)    Urinary retention    Family  History: Family History  Problem Relation Age of Onset  . Cancer Father 7       prostate  . CAD Father        possibly?  . Prostate cancer Father   . Heart Problems Father   . Cancer Mother        lung  . Lung disease Mother   . Stroke Neg Hx   . Diabetes Neg Hx   . Kidney disease Neg Hx   .  Kidney cancer Neg Hx   . Bladder Cancer Neg Hx     Social History:  reports that he has never smoked. He has never used smokeless tobacco. He reports that he does not drink alcohol or use drugs.  ROS: UROLOGY Frequent Urination?: No Hard to postpone urination?: Yes Burning/pain with urination?: No Get up at night to urinate?: No Leakage of urine?: No Urine stream starts and stops?: Yes Trouble starting stream?: No Do you have to strain to urinate?: No Blood in urine?: No Urinary tract infection?: No Sexually transmitted disease?: No Injury to kidneys or bladder?: No Painful intercourse?: No Weak stream?: No Erection problems?: Yes Penile pain?: Yes  Gastrointestinal Nausea?: No Vomiting?: No Indigestion/heartburn?: No Diarrhea?: No Constipation?: No  Constitutional Fever: No Night sweats?: No Weight loss?: No Fatigue?: Yes  Skin Skin rash/lesions?: No Itching?: No  Eyes Blurred vision?: No Double vision?: No  Ears/Nose/Throat Sore throat?: No Sinus problems?: No  Hematologic/Lymphatic Swollen glands?: No Easy bruising?: No  Cardiovascular Leg swelling?: No Chest pain?: No  Respiratory Cough?: No Shortness of breath?: Yes  Endocrine Excessive thirst?: No  Musculoskeletal Back pain?: No Joint pain?: No  Neurological Headaches?: No Dizziness?: No  Psychologic Depression?: No Anxiety?: No  Physical Exam: BP 123/82   Pulse (!) 101   Ht 5\' 11"  (1.803 m)   Wt 239 lb 9 oz (108.7 kg)   BMI 33.41 kg/m   Constitutional: Well nourished. Alert and oriented, No acute distress. HEENT: [place] AT, moist mucus membranes. Trachea midline, no  masses. Cardiovascular: No clubbing, cyanosis, or edema. Respiratory: Normal respiratory effort, no increased work of breathing. GI: Abdomen is soft, non tender, non distended, no abdominal masses. Liver and spleen not palpable.  No hernias appreciated.  Stool sample for occult testing is not indicated.   GU: No CVA tenderness.  No bladder fullness or masses.  Patient with circumcised phallus.  Urethral meatus is patent.  No penile discharge. No penile lesions or rashes. Scrotum without lesions, cysts, rashes and/or edema.  Testicles are located scrotally bilaterally. No masses are appreciated in the testicles. Left and right epididymis are normal.  Left side of phallus towards the head of the penis there is a 1 mm x 1 mm nodule in the head of the penis.   Rectal: Patient with  normal sphincter tone. Anus and perineum without scarring or rashes. No rectal masses are appreciated. Prostate is approximately 55 grams, no nodules are appreciated. Seminal vesicles are normal. Skin: No rashes, bruises or suspicious lesions. Lymph: No cervical or inguinal adenopathy. Neurologic: Grossly intact, no focal deficits, moving all 4 extremities. Psychiatric: Normal mood and affect.  Laboratory Data: Lab Results  Component Value Date   WBC 6.1 07/23/2017   HGB 16.6 07/23/2017   HCT 49.0 07/23/2017   MCV 93.7 07/23/2017   PLT 202.0 07/23/2017   Lab Results  Component Value Date   CREATININE 1.23 07/23/2017   Lab Results  Component Value Date   PSA 1.04 04/01/2016   PSA 1.16 03/22/2015   PSA 1.010 05/04/2013   Lab Results  Component Value Date   HGBA1C 5.9 07/23/2017     Results for orders placed or performed in visit on 09/02/17  PSA  Result Value Ref Range   Prostate Specific Ag, Serum 1.7 0.0 - 4.0 ng/mL   I have reviewed the labs  Pertinent Imaging: CLINICAL DATA:  Follow-up renal cysts  EXAM: RENAL / URINARY TRACT ULTRASOUND COMPLETE  COMPARISON:  08/29/2016 renal  sonogram  FINDINGS: Right Kidney:  Length: 12.8 cm. Normal right renal parenchymal echogenicity and thickness. No right hydronephrosis. There is a minimally complex 1.1 x 0.6 x 0.9 cm renal cyst in the upper right kidney with thin internal septation, previously 1.3 x 0.9 x 1.0 cm, stable. No additional right renal lesions. Incidental diffuse hepatic steatosis in the visualized liver.  Left Kidney:  Length: 13.5 cm. Normal left renal parenchymal echogenicity and thickness. No left hydronephrosis. Simple 2.3 x 1.6 x 1.7 cm medial lower and 2.2 x 2.0 x 2.0 cm upper left renal cysts, not significantly changed. No new left renal lesions.  Bladder:  Appears normal for degree of bladder distention.  IMPRESSION: 1. Small simple and minimally complex renal cysts without appreciable interval change. No suspicious renal masses. 2. Normal bladder.   Electronically Signed   By: Ilona Sorrel M.D.   On: 09/03/2017 08:09  I have independently reviewed the films   Assessment & Plan:    1. History of nephrolithiasis No stone seen on RUS Repeat RUS in one year Advised to contact our office or seek treatment in the ED if becomes febrile or pain/ vomiting are difficult control in order to arrange for emergent/urgent intervention  2. Weak urinary stream:   I PSS score is 16/3, it is worsening.  Patient would like to continue his tamsulosin 0.4 mg daily as it improved his urinary stream.  We will continue to monitor.  IPSS, PSA and exam in one year.   3. Peyronie's disease:  Patient is not interested in pursuing any treatment options at this time.    4. Minimally complex right renal cyst No appreciative interval change on recent renal ultrasound Return in 1 year for renal ultrasound  Return in about 1 year (around 09/17/2018) for IPSS, PSA, RUS report and exam.  These notes generated with voice recognition software. I apologize for typographical errors.  Zara Council,  PA-C  [facility] [address] [place], [place] [address] [phone]

## 2017-09-22 ENCOUNTER — Other Ambulatory Visit: Payer: Self-pay | Admitting: Family Medicine

## 2017-09-22 DIAGNOSIS — M109 Gout, unspecified: Secondary | ICD-10-CM

## 2017-09-22 DIAGNOSIS — D751 Secondary polycythemia: Secondary | ICD-10-CM

## 2017-09-22 DIAGNOSIS — E785 Hyperlipidemia, unspecified: Secondary | ICD-10-CM

## 2017-09-22 DIAGNOSIS — E119 Type 2 diabetes mellitus without complications: Secondary | ICD-10-CM

## 2017-09-23 ENCOUNTER — Other Ambulatory Visit (INDEPENDENT_AMBULATORY_CARE_PROVIDER_SITE_OTHER): Payer: Federal, State, Local not specified - PPO

## 2017-09-23 DIAGNOSIS — Z0000 Encounter for general adult medical examination without abnormal findings: Secondary | ICD-10-CM

## 2017-09-23 DIAGNOSIS — E785 Hyperlipidemia, unspecified: Secondary | ICD-10-CM | POA: Diagnosis not present

## 2017-09-23 LAB — BASIC METABOLIC PANEL
BUN: 19 mg/dL (ref 6–23)
CHLORIDE: 103 meq/L (ref 96–112)
CO2: 29 mEq/L (ref 19–32)
Calcium: 9.4 mg/dL (ref 8.4–10.5)
Creatinine, Ser: 1.34 mg/dL (ref 0.40–1.50)
GFR: 57.18 mL/min — ABNORMAL LOW (ref >60.00)
Glucose, Bld: 113 mg/dL — ABNORMAL HIGH (ref 70–99)
POTASSIUM: 3.9 meq/L (ref 3.5–5.1)
SODIUM: 140 meq/L (ref 135–145)

## 2017-09-23 LAB — LIPID PANEL
CHOL/HDL RATIO: 4
Cholesterol: 181 mg/dL (ref 0–200)
HDL: 43.5 mg/dL (ref >39.00)
LDL Cholesterol: 118 mg/dL — ABNORMAL HIGH (ref 0–99)
NONHDL: 137.43
Triglycerides: 98 mg/dL (ref 0.0–149.0)
VLDL: 19.6 mg/dL (ref 0.0–40.0)

## 2017-09-29 ENCOUNTER — Encounter: Payer: Federal, State, Local not specified - PPO | Admitting: Family Medicine

## 2017-09-30 ENCOUNTER — Encounter: Payer: Federal, State, Local not specified - PPO | Admitting: Family Medicine

## 2017-10-07 ENCOUNTER — Ambulatory Visit (INDEPENDENT_AMBULATORY_CARE_PROVIDER_SITE_OTHER): Payer: Federal, State, Local not specified - PPO | Admitting: Cardiovascular Disease

## 2017-10-07 ENCOUNTER — Encounter: Payer: Self-pay | Admitting: Cardiovascular Disease

## 2017-10-07 VITALS — BP 112/72 | HR 83 | Ht 71.0 in | Wt 239.0 lb

## 2017-10-07 DIAGNOSIS — R0600 Dyspnea, unspecified: Secondary | ICD-10-CM

## 2017-10-07 DIAGNOSIS — R0609 Other forms of dyspnea: Secondary | ICD-10-CM | POA: Diagnosis not present

## 2017-10-07 DIAGNOSIS — E785 Hyperlipidemia, unspecified: Secondary | ICD-10-CM | POA: Diagnosis not present

## 2017-10-07 MED ORDER — ATORVASTATIN CALCIUM 20 MG PO TABS: 20.0000 mg | ORAL_TABLET | Freq: Every day | ORAL | 3 refills | Status: DC

## 2017-10-07 NOTE — Patient Instructions (Signed)
Medication Instructions: START Atorvastatin 20 mg daily  If you need a refill on your cardiac medications before your next appointment, please call your pharmacy.   Labwork: Your provider would like for you to return in 2 months to have the following labs drawn: FASTING lipid and liver. Please go to the [facility] entrance and check in at the front desk. You do not need an appointment.   Follow-Up: Your physician wants you to follow-up in 12 months with Dr. Fletcher Anon. You will receive a reminder letter in the mail two months in advance. If you don't receive a letter, please call our office at [phone] to schedule this follow-up appointment.  Thank you for choosing [facility]!

## 2017-10-07 NOTE — Progress Notes (Signed)
Cardiology Office Note   Date:  10/07/2017   ID:  Richard Case, DOB [birth year], MRN [id number]  PCP:  [name], MD  Cardiologist:   Kathlyn Sacramento, MD   Chief Complaint  Patient presents with  . other    2 mo follow up Echo and Myoview 08/05/2017. Medications verbally reviewed.       History of Present Illness: Richard Case is a 62 y.o. male who is here today for follow-up visit regarding  exertional dyspnea and palpitations.  He has known history of type 2 diabetes, mild hyperlipidemia, sleep apnea not on CPAP and obesity.  He is not a smoker.  H He was seen recently for exertional dyspnea and palpitations especially when he goes uphill.  He underwent cardiac evaluation which included a treadmill nuclear stress test.  This showed no evidence of ischemia with normal ejection fraction.  He exercised for 8-1/2 minutes.  He was noted to have mild PVCs with exercise and in recovery.  He had an echocardiogram done which showed an EF of 50 to 55% with grade 1 diastolic dysfunction.  No new symptoms.   Past Medical History:  Diagnosis Date  . ADD (attention deficit disorder) 2011   no records of workup; strattera caused urinary retention, not interested in habit forming medication  . Atrial ectopy 2011   improved with CPAP, documented by holter  . Benign colon polyp ?2014   hyperplastic  . BPH (benign prostatic hypertrophy)    per prior PCP records  . Elevated blood pressure (not hypertension)   . Erectile dysfunction   . GERD (gastroesophageal reflux disease)   . Gout   . Heart murmur longstanding  . History of kidney stones 2008, 2017  . History of pneumonia 2009  . HLD (hyperlipidemia)   . Kidney stones    HX  . Male erectile dysfunction   . Obesity, Class I, BMI 30-34.9   . OSA (obstructive sleep apnea) 2010   OSA with stabilization at CPAP 7cm  . Peyronie disease    per prior pcp records  . Polycythemia    ?OSA related  . Prediabetes   . Rosacea    per prior pcp records  . Seasonal allergic rhinitis   . 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 CARDIOVASCULAR STRESS TEST  07/2017   low risk stress test, hypertensive response (End)  . COLONOSCOPY  2009   rec rpt 5 yrs per prior PCP records but no actual report  . COLONOSCOPY  03/2012   mild diverticulosis, rpt 5 yrs (Dr Hulan Saas in [place])  . EXTRACORPOREAL SHOCK WAVE LITHOTRIPSY Right 04/06/2015   Procedure: EXTRACORPOREAL SHOCK WAVE LITHOTRIPSY (ESWL);  Surgeon: Nickie Retort, MD;  Location: [facility] ORS;  Service: Urology;  Laterality: Right;  . MOHS SURGERY  2015   SCC of nose  . TONSILLECTOMY       Current Outpatient Medications  Medication Sig Dispense Refill  . allopurinol (ZYLOPRIM) 300 MG tablet Take 1 tablet (300 mg total) by mouth daily. 90 tablet 0  . aspirin 81 MG chewable tablet Chew 1 tablet (81 mg total) by mouth daily.    . metFORMIN (GLUCOPHAGE) 500 MG tablet Take 1 tablet (500 mg total) by mouth daily with breakfast. 90 tablet 0  . tamsulosin (FLOMAX) 0.4 MG CAPS capsule TAKE 1 CAPSULE (0.4 MG TOTAL) BY MOUTH DAILY. 90 capsule 3  . ibuprofen (ADVIL,MOTRIN) 200  MG tablet Take 400 mg by mouth every 6 (six) hours as needed for moderate pain.    Marland Kitchen scopolamine (TRANSDERM-SCOP, 1.5 MG,) 1 MG/3DAYS Place 1 patch (1.5 mg total) onto the skin every 3 (three) days. (Patient not taking: Reported on 10/07/2017) 4 patch 0   No current facility-administered medications for this visit.     Allergies:   Strattera [atomoxetine hcl]    Social History:  The patient  reports that he has never smoked. He has never used smokeless tobacco. He reports that he does not drink alcohol or use drugs.   Family History:  The patient's family history includes CAD in his father; Cancer in his mother; Cancer (age of onset: 58) in his father; Heart Problems in his father; Lung disease in his mother; Prostate cancer in  his father.    ROS:  Please see the history of present illness.   Otherwise, review of systems are positive for none.   All other systems are reviewed and negative.    PHYSICAL EXAM: VS:  BP 112/72 (BP Location: Left Arm, Patient Position: Sitting, Cuff Size: Normal)   Pulse 83   Ht 5\' 11"  (1.803 m)   Wt 239 lb (108.4 kg)   BMI 33.33 kg/m  , BMI Body mass index is 33.33 kg/m. GEN: Well nourished, well developed, in no acute distress  HEENT: normal  Neck: no JVD, carotid bruits, or masses Cardiac: RRR; no murmurs, rubs, or gallops,no edema  Respiratory:  clear to auscultation bilaterally, normal work of breathing GI: soft, nontender, nondistended, + BS MS: no deformity or atrophy  Skin: warm and dry, no rash Neuro:  Strength and sensation are intact Psych: euthymic mood, full affect   EKG:  EKG is ordered today. EKG showed normal sinus rhythm with nonspecific T wave changes.   Recent Labs: 07/23/2017: ALT 29; Hemoglobin 16.6; Platelets 202.0; Pro B Natriuretic peptide (BNP) 9.0; TSH 1.05 09/23/2017: BUN 19; Creatinine, Ser 1.34; Potassium 3.9; Sodium 140    Lipid Panel    Component Value Date/Time   CHOL 181 09/23/2017 0809   CHOL 187 05/04/2013   TRIG 98.0 09/23/2017 0809   TRIG 126 05/04/2013   HDL 43.50 09/23/2017 0809   CHOLHDL 4 09/23/2017 0809   VLDL 19.6 09/23/2017 0809   LDLCALC 118 (H) 09/23/2017 0809   LDLCALC 123 05/04/2013   LDLDIRECT 118.0 03/22/2015 0745      Wt Readings from Last 3 Encounters:  10/07/17 239 lb (108.4 kg)  09/16/17 239 lb 9 oz (108.7 kg)  09/10/17 242 lb (109.8 kg)       PAD Screen 07/24/2017  Previous PAD dx? No  Previous surgical procedure? No  Pain with walking? Yes  Subsides with rest? Yes  Feet/toe relief with dangling? No  Painful, non-healing ulcers? No  Extremities discolored? No      ASSESSMENT AND PLAN:  1.  Exertional dyspnea: Negative cardiac work-up so far including treadmill nuclear stress test and  echocardiogram.   I suspect that his symptoms might be related to some degree of physical deconditioning.  We discussed the importance of regular exercise, healthy diet and weight loss.  2.  Hyperlipidemia: Most recent lipid profile showed a cholesterol of 181, triglyceride of 98 and an LDL of 118.  Given that he is diabetic, he benefits from treatment with a statin to achieve an LDL below 70.  I discussed this with him and he is interested in treatment.  I started atorvastatin 20 mg daily.  Recheck lipid  and liver profile in 2 months.  3.  Exertional palpitations: Likely due to sinus tachycardia and some PVCs.  No evidence of PVCs a day by exam or an EKG.    Disposition:   FU with me in 12 months  Signed,  Kathlyn Sacramento, MD  10/07/2017 1:31 PM    [place]

## 2017-10-29 ENCOUNTER — Ambulatory Visit (INDEPENDENT_AMBULATORY_CARE_PROVIDER_SITE_OTHER): Payer: Federal, State, Local not specified - PPO | Admitting: Family Medicine

## 2017-10-29 ENCOUNTER — Encounter: Payer: Self-pay | Admitting: Family Medicine

## 2017-10-29 VITALS — BP 120/64 | HR 80 | Temp 97.8°F | Ht 69.5 in | Wt 239.0 lb

## 2017-10-29 DIAGNOSIS — H9319 Tinnitus, unspecified ear: Secondary | ICD-10-CM | POA: Insufficient documentation

## 2017-10-29 DIAGNOSIS — Z0000 Encounter for general adult medical examination without abnormal findings: Secondary | ICD-10-CM

## 2017-10-29 DIAGNOSIS — M109 Gout, unspecified: Secondary | ICD-10-CM

## 2017-10-29 DIAGNOSIS — E785 Hyperlipidemia, unspecified: Secondary | ICD-10-CM

## 2017-10-29 DIAGNOSIS — E119 Type 2 diabetes mellitus without complications: Secondary | ICD-10-CM

## 2017-10-29 DIAGNOSIS — Z23 Encounter for immunization: Secondary | ICD-10-CM | POA: Diagnosis not present

## 2017-10-29 DIAGNOSIS — Z1211 Encounter for screening for malignant neoplasm of colon: Secondary | ICD-10-CM | POA: Diagnosis not present

## 2017-10-29 DIAGNOSIS — E669 Obesity, unspecified: Secondary | ICD-10-CM

## 2017-10-29 DIAGNOSIS — N401 Enlarged prostate with lower urinary tract symptoms: Secondary | ICD-10-CM

## 2017-10-29 DIAGNOSIS — G4733 Obstructive sleep apnea (adult) (pediatric): Secondary | ICD-10-CM

## 2017-10-29 DIAGNOSIS — H9313 Tinnitus, bilateral: Secondary | ICD-10-CM

## 2017-10-29 DIAGNOSIS — R3912 Poor urinary stream: Secondary | ICD-10-CM

## 2017-10-29 MED ORDER — METFORMIN HCL 500 MG PO TABS: 500.0000 mg | ORAL_TABLET | Freq: Every day | ORAL | 3 refills | Status: DC

## 2017-10-29 MED ORDER — ALLOPURINOL 300 MG PO TABS: 300.0000 mg | ORAL_TABLET | Freq: Every day | ORAL | 3 refills | Status: DC

## 2017-10-29 NOTE — Assessment & Plan Note (Signed)
Preventative protocols reviewed and updated unless pt declined. Discussed healthy diet and lifestyle.  

## 2017-10-29 NOTE — Assessment & Plan Note (Signed)
Chronic, recently started on statin by cards. The 10-year ASCVD risk score Mikey Bussing DC Brooke Bonito., et al., 2013) is: 18.3%   Values used to calculate the score:     Age: 62 years     Sex: Male     Is Non-Hispanic African American: No     Diabetic: Yes     Tobacco smoker: No     Systolic Blood Pressure: 722 mmHg     Is BP treated: No     HDL Cholesterol: 43.5 mg/dL     Total Cholesterol: 181 mg/dL

## 2017-10-29 NOTE — Assessment & Plan Note (Signed)
Chronic. Check hearing screen today.

## 2017-10-29 NOTE — Assessment & Plan Note (Signed)
Recent A1c stable. Return in 6 months for DM f/u visit.

## 2017-10-29 NOTE — Assessment & Plan Note (Signed)
On flomax, followed by urology.

## 2017-10-29 NOTE — Assessment & Plan Note (Signed)
Off CPAP - had trouble tolerating mask. May look into dental appliance. Encouraged CPAP use - he will let me know if desires pulm referral for [facility] fitting clinic.

## 2017-10-29 NOTE — Patient Instructions (Addendum)
If interested, check with pharmacy about new 2 shot shingles series (shingrix).  Hearing screen today.  Look into dental appliance for sleep apnea. Let me know if you'd like to see sleep doctors again.  Work on regular exercise routine.  Return as needed or in 1 year for physical.  Health Maintenance, Male A healthy lifestyle and preventive care is important for your health and wellness. Ask your health care provider about what schedule of regular examinations is right for you. What should I know about weight and diet? Eat a Healthy Diet  Eat plenty of vegetables, fruits, whole grains, low-fat dairy products, and lean protein.  Do not eat a lot of foods high in solid fats, added sugars, or salt.  Maintain a Healthy Weight Regular exercise can help you achieve or maintain a healthy weight. You should:  Do at least 150 minutes of exercise each week. The exercise should increase your heart rate and make you sweat (moderate-intensity exercise).  Do strength-training exercises at least twice a week.  Watch Your Levels of Cholesterol and Blood Lipids  Have your blood tested for lipids and cholesterol every 5 years starting at 63 years of age. If you are at high risk for heart disease, you should start having your blood tested when you are 63 years old. You may need to have your cholesterol levels checked more often if: ? Your lipid or cholesterol levels are high. ? You are older than 63 years of age. ? You are at high risk for heart disease.  What should I know about cancer screening? Many types of cancers can be detected early and may often be prevented. Lung Cancer  You should be screened every year for lung cancer if: ? You are a current smoker who has smoked for at least 30 years. ? You are a former smoker who has quit within the past 15 years.  Talk to your health care provider about your screening options, when you should start screening, and how often you should be  screened.  Colorectal Cancer  Routine colorectal cancer screening usually begins at 63 years of age and should be repeated every 5-10 years until you are 63 years old. You may need to be screened more often if early forms of precancerous polyps or small growths are found. Your health care provider may recommend screening at an earlier age if you have risk factors for colon cancer.  Your health care provider may recommend using home test kits to check for hidden blood in the stool.  A small camera at the end of a tube can be used to examine your colon (sigmoidoscopy or colonoscopy). This checks for the earliest forms of colorectal cancer.  Prostate and Testicular Cancer  Depending on your age and overall health, your health care provider may do certain tests to screen for prostate and testicular cancer.  Talk to your health care provider about any symptoms or concerns you have about testicular or prostate cancer.  Skin Cancer  Check your skin from head to toe regularly.  Tell your health care provider about any new moles or changes in moles, especially if: ? There is a change in a mole's size, shape, or color. ? You have a mole that is larger than a pencil eraser.  Always use sunscreen. Apply sunscreen liberally and repeat throughout the day.  Protect yourself by wearing long sleeves, pants, a wide-brimmed hat, and sunglasses when outside.  What should I know about heart disease, diabetes, and high blood  pressure?  If you are 86-17 years of age, have your blood pressure checked every 3-5 years. If you are 64 years of age or older, have your blood pressure checked every year. You should have your blood pressure measured twice-once when you are at a hospital or clinic, and once when you are not at a hospital or clinic. Record the average of the two measurements. To check your blood pressure when you are not at a hospital or clinic, you can use: ? An automated blood pressure machine at a  pharmacy. ? A home blood pressure monitor.  Talk to your health care provider about your target blood pressure.  If you are between 21-33 years old, ask your health care provider if you should take aspirin to prevent heart disease.  Have regular diabetes screenings by checking your fasting blood sugar level. ? If you are at a normal weight and have a low risk for diabetes, have this test once every three years after the age of 12. ? If you are overweight and have a high risk for diabetes, consider being tested at a younger age or more often.  A one-time screening for abdominal aortic aneurysm (AAA) by ultrasound is recommended for men aged 70-75 years who are current or former smokers. What should I know about preventing infection? Hepatitis B If you have a higher risk for hepatitis B, you should be screened for this virus. Talk with your health care provider to find out if you are at risk for hepatitis B infection. Hepatitis C Blood testing is recommended for:  Everyone born from [birth year] through 1966.  Anyone with known risk factors for hepatitis C.  Sexually Transmitted Diseases (STDs)  You should be screened each year for STDs including gonorrhea and chlamydia if: ? You are sexually active and are younger than 63 years of age. ? You are older than 63 years of age and your health care provider tells you that you are at risk for this type of infection. ? Your sexual activity has changed since you were last screened and you are at an increased risk for chlamydia or gonorrhea. Ask your health care provider if you are at risk.  Talk with your health care provider about whether you are at high risk of being infected with HIV. Your health care provider may recommend a prescription medicine to help prevent HIV infection.  What else can I do?  Schedule regular health, dental, and eye exams.  Stay current with your vaccines (immunizations).  Do not use any tobacco products, such as  cigarettes, chewing tobacco, and e-cigarettes. If you need help quitting, ask your health care provider.  Limit alcohol intake to no more than 2 drinks per day. One drink equals 12 ounces of beer, 5 ounces of wine, or 1 ounces of hard liquor.  Do not use street drugs.  Do not share needles.  Ask your health care provider for help if you need support or information about quitting drugs.  Tell your health care provider if you often feel depressed.  Tell your health care provider if you have ever been abused or do not feel safe at home. This information is not intended to replace advice given to you by your health care provider. Make sure you discuss any questions you have with your health care provider. Document Released: 06/29/2007 Document Revised: 08/30/2015 Document Reviewed: 10/04/2014 Elsevier Interactive Patient Education  [employer].

## 2017-10-29 NOTE — Progress Notes (Signed)
BP 120/64 (BP Location: Left Arm, Patient Position: Sitting, Cuff Size: Normal)   Pulse 80   Temp 97.8 F (36.6 C) (Oral)   Ht 5' 9.5" (1.765 m)   Wt 239 lb (108.4 kg)   SpO2 96%   BMI 34.79 kg/m     Hearing Screening   125Hz  250Hz  500Hz  1000Hz  2000Hz  3000Hz  4000Hz  6000Hz  8000Hz   Right ear:   25 40 20  0    Left ear:   25 40 20  25       CC: CPE Subjective:    Patient ID: Richard Case, male    DOB: 12-27-1955, 62 y.o.   MRN: [id number]  HPI: Richard Case is a 62 y.o. male presenting on 10/29/2017 for Annual Exam   [place]   Recent cardiac workup reassuring - treadmill nuclear stress test and echocardiogram (Dr Fletcher Anon). Lipitor started last month by cardiology.   Constant ringing of B ears for years. Denies significant hearing loss.  Denies OSA trouble - no apnea.   Preventative: COLONOSCOPY 03/2012 mild diverticulosis, rpt 5 yrs (Dr Hulan Saas in [place]). Requests GI referral today Prostate cancer screening - established with urology - followed there.  Flu shot yearly  Pneumovax 2017  Tdap 2017  Shingles shot - will check with insurance  Seat belt use discussed  Sunscreen use discussed. No changing moles on skin.  Non smoker  Alcohol - rarely  Dentist q6 mo Eye exam - hasn't recently seen - overdue  Lives with wife [name], 1 dog Occupation: retired - Civil engineer, contracting for [employer]  Edu: BS Activity: walking dog  Diet: good water, fruits/vegetables daily   Relevant past medical, surgical, family and social history reviewed and updated as indicated. Interim medical history since our last visit reviewed. Allergies and medications reviewed and updated. Outpatient Medications Prior to Visit  Medication Sig Dispense Refill  . aspirin 81 MG chewable tablet Chew 1 tablet (81 mg total) by mouth daily.    Marland Kitchen ibuprofen (ADVIL,MOTRIN) 200 MG tablet Take 400 mg by mouth every 6 (six) hours as needed for moderate pain.    . tamsulosin (FLOMAX) 0.4 MG  CAPS capsule TAKE 1 CAPSULE (0.4 MG TOTAL) BY MOUTH DAILY. 90 capsule 3  . allopurinol (ZYLOPRIM) 300 MG tablet Take 1 tablet (300 mg total) by mouth daily. 90 tablet 0  . metFORMIN (GLUCOPHAGE) 500 MG tablet Take 1 tablet (500 mg total) by mouth daily with breakfast. 90 tablet 0  . atorvastatin (LIPITOR) 20 MG tablet Take 1 tablet (20 mg total) by mouth daily. (Patient not taking: Reported on 10/29/2017) 90 tablet 3  . scopolamine (TRANSDERM-SCOP, 1.5 MG,) 1 MG/3DAYS Place 1 patch (1.5 mg total) onto the skin every 3 (three) days. (Patient not taking: Reported on 10/29/2017) 4 patch 0   No facility-administered medications prior to visit.      Per HPI unless specifically indicated in ROS section below Review of Systems  Constitutional: Negative for activity change, appetite change, chills, fatigue, fever and unexpected weight change.  HENT: Negative for hearing loss.   Eyes: Negative for visual disturbance.  Respiratory: Positive for shortness of breath (going up hills - s/p benign cardiac evaluation). Negative for cough, chest tightness and wheezing.   Cardiovascular: Negative for chest pain, palpitations and leg swelling.  Gastrointestinal: Negative for abdominal distention, abdominal pain, blood in stool, constipation, diarrhea, nausea and vomiting.  Genitourinary: Negative for difficulty urinating and hematuria.  Musculoskeletal: Negative for arthralgias, myalgias and neck pain.  Skin: Negative for rash.  Neurological: Negative for dizziness, seizures, syncope and headaches.  Hematological: Negative for adenopathy. Does not bruise/bleed easily.  Psychiatric/Behavioral: Positive for dysphoric mood. The patient is not nervous/anxious.        Some depressed mood after parents passed away - father 10 yrs ago, mother 1.5 yrs ago       Objective:    BP 120/64 (BP Location: Left Arm, Patient Position: Sitting, Cuff Size: Normal)   Pulse 80   Temp 97.8 F (36.6 C) (Oral)   Ht 5' 9.5"  (1.765 m)   Wt 239 lb (108.4 kg)   SpO2 96%   BMI 34.79 kg/m   Wt Readings from Last 3 Encounters:  10/29/17 239 lb (108.4 kg)  10/07/17 239 lb (108.4 kg)  09/16/17 239 lb 9 oz (108.7 kg)    Physical Exam  Constitutional: He is oriented to person, place, and time. He appears well-developed and well-nourished. No distress.  HENT:  Head: Normocephalic and atraumatic.  Right Ear: Hearing, tympanic membrane, external ear and ear canal normal.  Left Ear: Hearing, tympanic membrane, external ear and ear canal normal.  Nose: Nose normal.  Mouth/Throat: Uvula is midline, oropharynx is clear and moist and mucous membranes are normal. No oropharyngeal exudate, posterior oropharyngeal edema or posterior oropharyngeal erythema.  Eyes: Pupils are equal, round, and reactive to light. Conjunctivae and EOM are normal. No scleral icterus.  Neck: Normal range of motion. Neck supple. Carotid bruit is not present. No thyromegaly present.  Cardiovascular: Normal rate, regular rhythm, normal heart sounds and intact distal pulses.  No murmur heard. Pulses:      Radial pulses are 2+ on the right side, and 2+ on the left side.  Pulmonary/Chest: Effort normal and breath sounds normal. No respiratory distress. He has no wheezes. He has no rales.  Abdominal: Soft. Bowel sounds are normal. He exhibits no distension and no mass. There is no tenderness. There is no rebound and no guarding.  Musculoskeletal: Normal range of motion. He exhibits no edema.  Lymphadenopathy:    He has no cervical adenopathy.  Neurological: He is alert and oriented to person, place, and time.  CN grossly intact, station and gait intact  Skin: Skin is warm and dry. No rash noted.  Psychiatric: He has a normal mood and affect. His behavior is normal. Judgment and thought content normal.  Nursing note and vitals reviewed.  Diabetic Foot Exam - Simple   Simple Foot Form Diabetic Foot exam was performed with the following findings:  Yes  10/29/2017  4:42 PM  Visual Inspection No deformities, no ulcerations, no other skin breakdown bilaterally:  Yes Sensation Testing Intact to touch and monofilament testing bilaterally:  Yes Pulse Check Posterior Tibialis and Dorsalis pulse intact bilaterally:  Yes Comments     Results for orders placed or performed in visit on 09/23/17  Lipid panel  Result Value Ref Range   Cholesterol 181 0 - 200 mg/dL   Triglycerides 98.0 0.0 - 149.0 mg/dL   HDL 43.50 >39.00 mg/dL   VLDL 19.6 0.0 - 40.0 mg/dL   LDL Cholesterol 118 (H) 0 - 99 mg/dL   Total CHOL/HDL Ratio 4    NonHDL 937.90   Basic metabolic panel  Result Value Ref Range   Sodium 140 135 - 145 mEq/L   Potassium 3.9 3.5 - 5.1 mEq/L   Chloride 103 96 - 112 mEq/L   CO2 29 19 - 32 mEq/L   Glucose, Bld 113 (H) 70 -  99 mg/dL   BUN 19 6 - 23 mg/dL   Creatinine, Ser 1.34 0.40 - 1.50 mg/dL   Calcium 9.4 8.4 - 10.5 mg/dL   GFR 57.18 (L) >60.00 mL/min   Lab Results  Component Value Date   WBC 6.1 07/23/2017   HGB 16.6 07/23/2017   HCT 49.0 07/23/2017   MCV 93.7 07/23/2017   PLT 202.0 07/23/2017    Lab Results  Component Value Date   HGBA1C 5.9 07/23/2017    Depression screen PHQ 2/9 10/29/2017 10/29/2017 12/04/2015  Decreased Interest 2 0 0  Down, Depressed, Hopeless 2 - 0  PHQ - 2 Score 4 0 0  Altered sleeping 2 - -  Tired, decreased energy 3 - -  Change in appetite 1 - -  Feeling bad or failure about yourself  1 - -  Trouble concentrating 0 - -  Moving slowly or fidgety/restless 0 - -  Suicidal thoughts 0 - -  PHQ-9 Score 11 - -    GAD 7 : Generalized Anxiety Score 10/29/2017  Nervous, Anxious, on Edge 1  Control/stop worrying 1  Worry too much - different things 3  Trouble relaxing 0  Restless 0  Easily annoyed or irritable 0  Afraid - awful might happen 2  Total GAD 7 Score 7       Assessment & Plan:   Problem List Items Addressed This Visit    Tinnitus    Chronic. Check hearing screen today.        OSA (obstructive sleep apnea)    Off CPAP - had trouble tolerating mask. May look into dental appliance. Encouraged CPAP use - he will let me know if desires pulm referral for [facility] fitting clinic.       Obesity, Class I, BMI 30-34.9    Discussed healthy diet and lifestyle changes to affect sustainable weight loss. Encouraged regular exercise routine.       HLD (hyperlipidemia)    Chronic, recently started on statin by cards. The 10-year ASCVD risk score Mikey Bussing DC Brooke Bonito., et al., 2013) is: 18.3%   Values used to calculate the score:     Age: 44 years     Sex: Male     Is Non-Hispanic African American: No     Diabetic: Yes     Tobacco smoker: No     Systolic Blood Pressure: 948 mmHg     Is BP treated: No     HDL Cholesterol: 43.5 mg/dL     Total Cholesterol: 181 mg/dL       Health maintenance examination - Primary    Preventative protocols reviewed and updated unless pt declined. Discussed healthy diet and lifestyle.       Gout    No recent gout flares on allopurinol.       Controlled diabetes mellitus type II without complication (HCC)    Recent A1c stable. Return in 6 months for DM f/u visit.       Relevant Medications   metFORMIN (GLUCOPHAGE) 500 MG tablet   BPH (benign prostatic hyperplasia)    On flomax, followed by urology.        Other Visit Diagnoses    Need for influenza vaccination       Relevant Orders   Flu Vaccine QUAD 36+ mos IM (Completed)   Special screening for malignant neoplasms, colon       Relevant Orders   Ambulatory referral to Gastroenterology       Meds ordered this encounter  Medications  .  allopurinol (ZYLOPRIM) 300 MG tablet    Sig: Take 1 tablet (300 mg total) by mouth daily.    Dispense:  90 tablet    Refill:  3  . metFORMIN (GLUCOPHAGE) 500 MG tablet    Sig: Take 1 tablet (500 mg total) by mouth daily with breakfast.    Dispense:  90 tablet    Refill:  3   Orders Placed This Encounter  Procedures  . Flu Vaccine QUAD 36+ mos IM   . Ambulatory referral to Gastroenterology    Referral Priority:   Routine    Referral Type:   Consultation    Referral Reason:   Specialty Services Required    Number of Visits Requested:   1    Follow up plan: Return in about 1 year (around 10/30/2018) for annual exam, prior fasting for blood work.  [name], MD

## 2017-10-29 NOTE — Assessment & Plan Note (Addendum)
No recent gout flares on allopurinol.

## 2017-10-29 NOTE — Assessment & Plan Note (Signed)
Discussed healthy diet and lifestyle changes to affect sustainable weight loss. Encouraged regular exercise routine.

## 2017-11-19 ENCOUNTER — Encounter: Payer: Self-pay | Admitting: *Deleted

## 2017-11-26 ENCOUNTER — Other Ambulatory Visit: Payer: Self-pay

## 2017-11-26 DIAGNOSIS — Z1211 Encounter for screening for malignant neoplasm of colon: Secondary | ICD-10-CM

## 2017-11-26 MED ORDER — NA SULFATE-K SULFATE-MG SULF 17.5-3.13-1.6 GM/177ML PO SOLN: 1.0000 | Freq: Once | ORAL | 0 refills | Status: AC

## 2017-12-18 ENCOUNTER — Ambulatory Visit: Payer: Federal, State, Local not specified - PPO | Admitting: Anesthesiology

## 2017-12-18 ENCOUNTER — Encounter
Admission: RE | Disposition: A | Discharge status: Home / Self Care | Payer: Self-pay | Source: Ambulatory Visit | Attending: Gastroenterology

## 2017-12-18 ENCOUNTER — Ambulatory Visit
Admission: RE | Admit: 2017-12-18 | Discharge: 2017-12-18 | Disposition: A | Discharge status: Home / Self Care | Payer: Federal, State, Local not specified - PPO | Source: Ambulatory Visit | Attending: Gastroenterology | Admitting: Gastroenterology

## 2017-12-18 DIAGNOSIS — D126 Benign neoplasm of colon, unspecified: Secondary | ICD-10-CM | POA: Diagnosis not present

## 2017-12-18 DIAGNOSIS — E785 Hyperlipidemia, unspecified: Secondary | ICD-10-CM | POA: Diagnosis not present

## 2017-12-18 DIAGNOSIS — Z79899 Other long term (current) drug therapy: Secondary | ICD-10-CM | POA: Insufficient documentation

## 2017-12-18 DIAGNOSIS — Z1211 Encounter for screening for malignant neoplasm of colon: Secondary | ICD-10-CM | POA: Insufficient documentation

## 2017-12-18 DIAGNOSIS — Z85818 Personal history of malignant neoplasm of other sites of lip, oral cavity, and pharynx: Secondary | ICD-10-CM | POA: Insufficient documentation

## 2017-12-18 DIAGNOSIS — K621 Rectal polyp: Secondary | ICD-10-CM | POA: Insufficient documentation

## 2017-12-18 DIAGNOSIS — K5790 Diverticulosis of intestine, part unspecified, without perforation or abscess without bleeding: Secondary | ICD-10-CM | POA: Diagnosis not present

## 2017-12-18 DIAGNOSIS — E119 Type 2 diabetes mellitus without complications: Secondary | ICD-10-CM | POA: Insufficient documentation

## 2017-12-18 DIAGNOSIS — Z86010 Personal history of colonic polyps: Secondary | ICD-10-CM | POA: Diagnosis not present

## 2017-12-18 DIAGNOSIS — N400 Enlarged prostate without lower urinary tract symptoms: Secondary | ICD-10-CM | POA: Insufficient documentation

## 2017-12-18 DIAGNOSIS — G4733 Obstructive sleep apnea (adult) (pediatric): Secondary | ICD-10-CM | POA: Insufficient documentation

## 2017-12-18 DIAGNOSIS — D128 Benign neoplasm of rectum: Secondary | ICD-10-CM | POA: Diagnosis not present

## 2017-12-18 DIAGNOSIS — D120 Benign neoplasm of cecum: Secondary | ICD-10-CM | POA: Insufficient documentation

## 2017-12-18 DIAGNOSIS — K635 Polyp of colon: Secondary | ICD-10-CM | POA: Diagnosis not present

## 2017-12-18 DIAGNOSIS — K5730 Diverticulosis of large intestine without perforation or abscess without bleeding: Secondary | ICD-10-CM | POA: Insufficient documentation

## 2017-12-18 DIAGNOSIS — Z9989 Dependence on other enabling machines and devices: Secondary | ICD-10-CM | POA: Diagnosis not present

## 2017-12-18 DIAGNOSIS — Z7984 Long term (current) use of oral hypoglycemic drugs: Secondary | ICD-10-CM | POA: Diagnosis not present

## 2017-12-18 DIAGNOSIS — M109 Gout, unspecified: Secondary | ICD-10-CM | POA: Insufficient documentation

## 2017-12-18 DIAGNOSIS — Z7982 Long term (current) use of aspirin: Secondary | ICD-10-CM | POA: Insufficient documentation

## 2017-12-18 HISTORY — PX: COLONOSCOPY WITH PROPOFOL: SHX5780

## 2017-12-18 SURGERY — COLONOSCOPY WITH PROPOFOL
Anesthesia: General

## 2017-12-18 MED ORDER — LIDOCAINE HCL (CARDIAC) PF 100 MG/5ML IV SOSY
PREFILLED_SYRINGE | INTRAVENOUS | Status: DC | PRN
  Administered 2017-12-18: 100 mg via INTRAVENOUS

## 2017-12-18 MED ORDER — PROPOFOL 10 MG/ML IV BOLUS
INTRAVENOUS | Status: DC | PRN
  Administered 2017-12-18: 100 mg via INTRAVENOUS

## 2017-12-18 MED ORDER — SODIUM CHLORIDE 0.9 % IV SOLN
INTRAVENOUS | Status: DC
  Administered 2017-12-18: 09:00:00 via INTRAVENOUS

## 2017-12-18 MED ORDER — PROPOFOL 500 MG/50ML IV EMUL
INTRAVENOUS | Status: AC
  Filled 2017-12-18: qty 50

## 2017-12-18 MED ORDER — PROPOFOL 500 MG/50ML IV EMUL
INTRAVENOUS | Status: DC | PRN
  Administered 2017-12-18: 150 ug/kg/min via INTRAVENOUS

## 2017-12-18 NOTE — Anesthesia Postprocedure Evaluation (Signed)
Anesthesia Post Note  Patient: Evren Shankland  Procedure(s) Performed: COLONOSCOPY WITH PROPOFOL (N/A )  Patient location during evaluation: Endoscopy Anesthesia Type: General Level of consciousness: awake and alert Pain management: pain level controlled Vital Signs Assessment: post-procedure vital signs reviewed and stable Respiratory status: spontaneous breathing, nonlabored ventilation, respiratory function stable and patient connected to nasal cannula oxygen Cardiovascular status: blood pressure returned to baseline and stable Postop Assessment: no apparent nausea or vomiting Anesthetic complications: no     Last Vitals:  Vitals:   12/18/17 1003 12/18/17 1013  BP: 119/72 117/70  Pulse: 76 75  Resp: (!) 21 (!) 22  Temp:    SpO2: 98% 99%    Last Pain:  Vitals:   12/18/17 1013  TempSrc:   PainSc: 0-No pain                 Martha Clan

## 2017-12-18 NOTE — Anesthesia Preprocedure Evaluation (Signed)
Anesthesia Evaluation  Patient identified by MRN, date of birth, ID band Patient awake    Reviewed: Allergy & Precautions, H&P , NPO status , Patient's Chart, lab work & pertinent test results, reviewed documented beta blocker date and time   History of Anesthesia Complications Negative for: history of anesthetic complications  Airway Mallampati: I  TM Distance: >3 FB Neck ROM: full    Dental  (+) Missing, Dental Advidsory Given, Poor Dentition, Caps   Pulmonary neg shortness of breath, sleep apnea , neg COPD, neg recent URI,           Cardiovascular Exercise Tolerance: Good (-) hypertension(-) angina(-) CAD, (-) Past MI, (-) Cardiac Stents and (-) CABG (-) dysrhythmias + Valvular Problems/Murmurs      Neuro/Psych negative neurological ROS     GI/Hepatic Neg liver ROS, GERD  ,  Endo/Other  negative endocrine ROSdiabetes, Oral Hypoglycemic Agents  Renal/GU Renal disease (kidney stones)  negative genitourinary   Musculoskeletal   Abdominal   Peds  Hematology negative hematology ROS (+)   Anesthesia Other Findings Past Medical History: 2011: ADD (attention deficit disorder)     Comment:  no records of workup; strattera caused urinary               retention, not interested in habit forming medication 2011: Atrial ectopy     Comment:  improved with CPAP, documented by holter ?2014: Benign colon polyp     Comment:  hyperplastic No date: BPH (benign prostatic hypertrophy)     Comment:  per prior PCP records No date: Diabetes mellitus without complication (HCC) No date: Elevated blood pressure (not hypertension) No date: Erectile dysfunction No date: GERD (gastroesophageal reflux disease) No date: Gout longstanding: Heart murmur 2008, 2017: History of kidney stones 2009: History of pneumonia No date: HLD (hyperlipidemia) No date: Kidney stones     Comment:  HX No date: Male erectile dysfunction No date:  Obesity, Class I, BMI 30-34.9 2010: OSA (obstructive sleep apnea)     Comment:  OSA with stabilization at CPAP 7cm No date: Peyronie disease     Comment:  per prior pcp records No date: Polycythemia     Comment:  ?OSA related No date: Prediabetes No date: Rosacea     Comment:  per prior pcp records No date: Seasonal allergic rhinitis 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   Reproductive/Obstetrics negative OB ROS                             Anesthesia Physical Anesthesia Plan  ASA: II  Anesthesia Plan: General   Post-op Pain Management:    Induction: Intravenous  PONV Risk Score and Plan: 2 and Propofol infusion and TIVA  Airway Management Planned: Natural Airway and Nasal Cannula  Additional Equipment:   Intra-op Plan:   Post-operative Plan:   Informed Consent: I have reviewed the patients History and Physical, chart, labs and discussed the procedure including the risks, benefits and alternatives for the proposed anesthesia with the patient or authorized representative who has indicated his/her understanding and acceptance.   Dental Advisory Given  Plan Discussed with: Anesthesiologist, CRNA and Surgeon  Anesthesia Plan Comments:         Anesthesia Quick Evaluation

## 2017-12-18 NOTE — H&P (Signed)
Richard Bellows, MD [address] [place], [place], [place], [address] [address], [place], [place], [place], [address] Phone: [phone]  Fax: [phone]  Primary Care Physician:  Richard Bush, MD   Pre-Procedure History & Physical: HPI:  Richard Case is a 62 y.o. male is here for an colonoscopy.   Past Medical History:  Diagnosis Date  . ADD (attention deficit disorder) 2011   no records of workup; strattera caused urinary retention, not interested in habit forming medication  . Atrial ectopy 2011   improved with CPAP, documented by holter  . Benign colon polyp ?2014   hyperplastic  . BPH (benign prostatic hypertrophy)    per prior PCP records  . Diabetes mellitus without complication ([place])   . Elevated blood pressure (not hypertension)   . Erectile dysfunction   . GERD (gastroesophageal reflux disease)   . Gout   . Heart murmur longstanding  . History of kidney stones 2008, 2017  . History of pneumonia 2009  . HLD (hyperlipidemia)   . Kidney stones    HX  . Male erectile dysfunction   . Obesity, Class I, BMI 30-34.9   . OSA (obstructive sleep apnea) 2010   OSA with stabilization at CPAP 7cm  . Peyronie disease    per prior pcp records  . Polycythemia    ?OSA related  . Prediabetes   . Rosacea    per prior pcp records  . Seasonal allergic rhinitis   . 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 CARDIOVASCULAR STRESS TEST  07/2017   low risk stress test, hypertensive response ([place])  . COLONOSCOPY  2009   rec rpt 5 yrs per prior PCP records but no actual report  . COLONOSCOPY  03/2012   mild diverticulosis, rpt 5 yrs (Dr Hulan Saas in [place])  . EXTRACORPOREAL SHOCK WAVE LITHOTRIPSY Right 04/06/2015   Procedure: EXTRACORPOREAL SHOCK WAVE LITHOTRIPSY (ESWL);  Surgeon: Nickie Retort, MD;  Location: [facility] ORS;  Service: Urology;  Laterality: Right;  . MOHS SURGERY  2015   SCC of  nose  . TONSILLECTOMY      Prior to Admission medications   Medication Sig Start Date End Date Taking? Authorizing Provider  allopurinol (ZYLOPRIM) 300 MG tablet Take 1 tablet (300 mg total) by mouth daily. 10/29/17   Richard Bush, MD  aspirin 81 MG chewable tablet Chew 1 tablet (81 mg total) by mouth daily. 07/23/17   Richard Bush, MD  atorvastatin (LIPITOR) 20 MG tablet Take 1 tablet (20 mg total) by mouth daily. Patient not taking: Reported on 10/29/2017 10/07/17 01/05/18  [name], MD  ibuprofen (ADVIL,MOTRIN) 200 MG tablet Take 400 mg by mouth every 6 (six) hours as needed for moderate pain.    [provider]  metFORMIN (GLUCOPHAGE) 500 MG tablet Take 1 tablet (500 mg total) by mouth daily with breakfast. 10/29/17   Richard Bush, MD  scopolamine (TRANSDERM-SCOP, 1.5 MG,) 1 MG/3DAYS Place 1 patch (1.5 mg total) onto the skin every 3 (three) days. Patient not taking: Reported on 10/29/2017 01/16/17   Richard Bush, MD  tamsulosin (FLOMAX) 0.4 MG CAPS capsule TAKE 1 CAPSULE (0.4 MG TOTAL) BY MOUTH DAILY. 09/16/17   Richard Case A, PA-C    Allergies as of 11/26/2017 - Review Complete 10/29/2017  Allergen Reaction Noted  . Strattera [atomoxetine hcl] Other (See Comments) 02/04/2015    Family History  Problem Relation Age of  Onset  . Cancer Father 61       prostate  . CAD Father        possibly?  . Prostate cancer Father   . Heart Problems Father   . Cancer Mother        lung  . Lung disease Mother   . Stroke Neg Hx   . Diabetes Neg Hx   . Kidney disease Neg Hx   . Kidney cancer Neg Hx   . Bladder Cancer Neg Hx     Social History   Socioeconomic History  . Marital status: Married    Spouse name: Not on file  . Number of children: Not on file  . Years of education: Not on file  . Highest education level: Not on file  Occupational History  . Not on file  Social Needs  . Financial resource strain: Not on file  . Food insecurity:     Worry: Not on file    Inability: Not on file  . Transportation needs:    Medical: Not on file    Non-medical: Not on file  Tobacco Use  . Smoking status: Never Smoker  . Smokeless tobacco: Never Used  Substance and Sexual Activity  . Alcohol use: Yes    Alcohol/week: 0.0 standard drinks    Comment: none in months  . Drug use: No  . Sexual activity: Not on file  Lifestyle  . Physical activity:    Days per week: Not on file    Minutes per session: Not on file  . Stress: Not on file  Relationships  . Social connections:    Talks on phone: Not on file    Gets together: Not on file    Attends religious service: Not on file    Active member of club or organization: Not on file    Attends meetings of clubs or organizations: Not on file    Relationship status: Not on file  . Intimate partner violence:    Fear of current or ex partner: Not on file    Emotionally abused: Not on file    Physically abused: Not on file    Forced sexual activity: Not on file  Other Topics Concern  . Not on file  Social History Narrative   Lives with wife [place], 1 dog   Occupation: retired - Civil engineer, contracting for [employer]   Edu: BS   Activity: active in yard   Diet: good water, fruits/vegetables daily    Review of Systems: See HPI, otherwise negative ROS  Physical Exam: BP 104/74   Pulse 70   Temp (!) 96.4 F (35.8 C) (Tympanic)   Resp (!) 22   Ht 5\' 10"  (1.778 m)   Wt 104.3 kg   SpO2 100%   BMI 33.00 kg/m  General:   Alert,  pleasant and cooperative in NAD Head:  Normocephalic and atraumatic. Neck:  Supple; no masses or thyromegaly. Lungs:  Clear throughout to auscultation, normal respiratory effort.    Heart:  +S1, +S2, Regular rate and rhythm, No edema. Abdomen:  Soft, nontender and nondistended. Normal bowel sounds, without guarding, and without rebound.   Neurologic:  Alert and  oriented x4;  grossly normal neurologically.  Impression/Plan: [name] is here for an  colonoscopy to be performed for surveillance due to prior history of colon polyps   Risks, benefits, limitations, and alternatives regarding  colonoscopy have been reviewed with the patient.  Questions have been answered.  All parties agreeable.  Richard Bellows, MD  12/18/2017, 9:08 AM

## 2017-12-18 NOTE — Anesthesia Post-op Follow-up Note (Signed)
Anesthesia QCDR form completed.        

## 2017-12-18 NOTE — Op Note (Signed)
[facility] Patient Name: Richard Case Procedure Date: 12/18/2017 9:12 AM MRN: [id number] Account #: [id number] Date of Birth: [birth year] Admit Type: Outpatient Age: 62 Room: [facility] ENDO ROOM 4 Gender: Male Note Status: Finalized Procedure:            Colonoscopy Indications:          High risk colon cancer surveillance: Personal history                        of colonic polyps Providers:            [name] MD, MD Referring MD:         [name] (Referring MD) Medicines:            Monitored Anesthesia Care Complications:        No immediate complications. Procedure:            Pre-Anesthesia Assessment:                       - Prior to the procedure, a History and Physical was                        performed, and patient medications, allergies and                        sensitivities were reviewed. The patient's tolerance of                        previous anesthesia was reviewed.                       - The risks and benefits of the procedure and the                        sedation options and risks were discussed with the                        patient. All questions were answered and informed                        consent was obtained.                       - ASA Grade Assessment: II - A patient with mild                        systemic disease.                       After obtaining informed consent, the colonoscope was                        passed under direct vision. Throughout the procedure,                        the patient's blood pressure, pulse, and oxygen                        saturations were monitored continuously. The                        Colonoscope was  introduced through the anus and                        advanced to the the cecum, identified by the                        appendiceal orifice, IC valve and transillumination.                        The colonoscopy was performed with ease. The patient            tolerated the procedure well. The quality of the bowel                        preparation was good. Findings:      The perianal and digital rectal examinations were normal.      Multiple small-mouthed diverticula were found in the entire colon.      A 3 mm polyp was found in the rectum. The polyp was sessile. The polyp       was removed with a cold biopsy forceps. Resection and retrieval were       complete.      Two sessile polyps were found in the cecum. The polyps were 3 to 4 mm in       size. These polyps were removed with a cold biopsy forceps. Resection       and retrieval were complete.      The exam was otherwise without abnormality on direct and retroflexion       views. Impression:           - Diverticulosis in the entire examined colon.                       - One 3 mm polyp in the rectum, removed with a cold                        biopsy forceps. Resected and retrieved.                       - Two 3 to 4 mm polyps in the cecum, removed with a                        cold biopsy forceps. Resected and retrieved.                       - The examination was otherwise normal on direct and                        retroflexion views. Recommendation:       - Discharge patient to home (with escort).                       - Resume previous diet.                       - Continue present medications.                       - Await pathology results.                       - Repeat  colonoscopy in 3 - 5 years for surveillance                        based on pathology results. Procedure Code(s):    --- Professional ---                       [id number], Colonoscopy, flexible; with biopsy, single or                        multiple Diagnosis Code(s):    --- Professional ---                       Z86.010, Personal history of colonic polyps                       K62.1, Rectal polyp                       D12.0, Benign neoplasm of cecum                       K57.30, Diverticulosis of large  intestine without                        perforation or abscess without bleeding CPT copyright 2018 [employer]. All rights reserved. The codes documented in this report are preliminary and upon coder review may  be revised to meet current compliance requirements. [name], MD [name] MD, MD 12/18/2017 9:41:04 AM This report has been signed electronically. Number of Addenda: 0 Note Initiated On: 12/18/2017 9:12 AM Scope Withdrawal Time: 0 hours 14 minutes 57 seconds  Total Procedure Duration: 0 hours 16 minutes 22 seconds       [facility]

## 2017-12-18 NOTE — Transfer of Care (Signed)
Immediate Anesthesia Transfer of Care Note  Patient: Zared Knoth  Procedure(s) Performed: COLONOSCOPY WITH PROPOFOL (N/A )  Patient Location: Endoscopy Unit  Anesthesia Type:General  Level of Consciousness: awake and alert   Airway & Oxygen Therapy: Patient Spontanous Breathing and Patient connected to nasal cannula oxygen  Post-op Assessment: Report given to RN and Post -op Vital signs reviewed and stable  Post vital signs: Reviewed and stable  Last Vitals:  Vitals Value Taken Time  BP 114/70 12/18/2017  9:43 AM  Temp 36.2 C 12/18/2017  9:43 AM  Pulse 79 12/18/2017  9:48 AM  Resp 21 12/18/2017  9:48 AM  SpO2 98 % 12/18/2017  9:48 AM  Vitals shown include unvalidated device data.  Last Pain:  Vitals:   12/18/17 0943  TempSrc: Tympanic  PainSc: Asleep         Complications: No apparent anesthesia complications

## 2017-12-19 ENCOUNTER — Encounter: Payer: Self-pay | Admitting: Gastroenterology

## 2017-12-19 LAB — SURGICAL PATHOLOGY

## 2017-12-21 ENCOUNTER — Encounter: Payer: Self-pay | Admitting: Family Medicine

## 2017-12-22 ENCOUNTER — Encounter: Payer: Self-pay | Admitting: Gastroenterology

## 2018-01-25 IMAGING — DX DG CHEST 2V
2 series · 2 of 2 positions shown · non-contrast
Comparison: None.

CLINICAL DATA: Radon exposure

EXAM:
CHEST  2 VIEW

[chest pa]
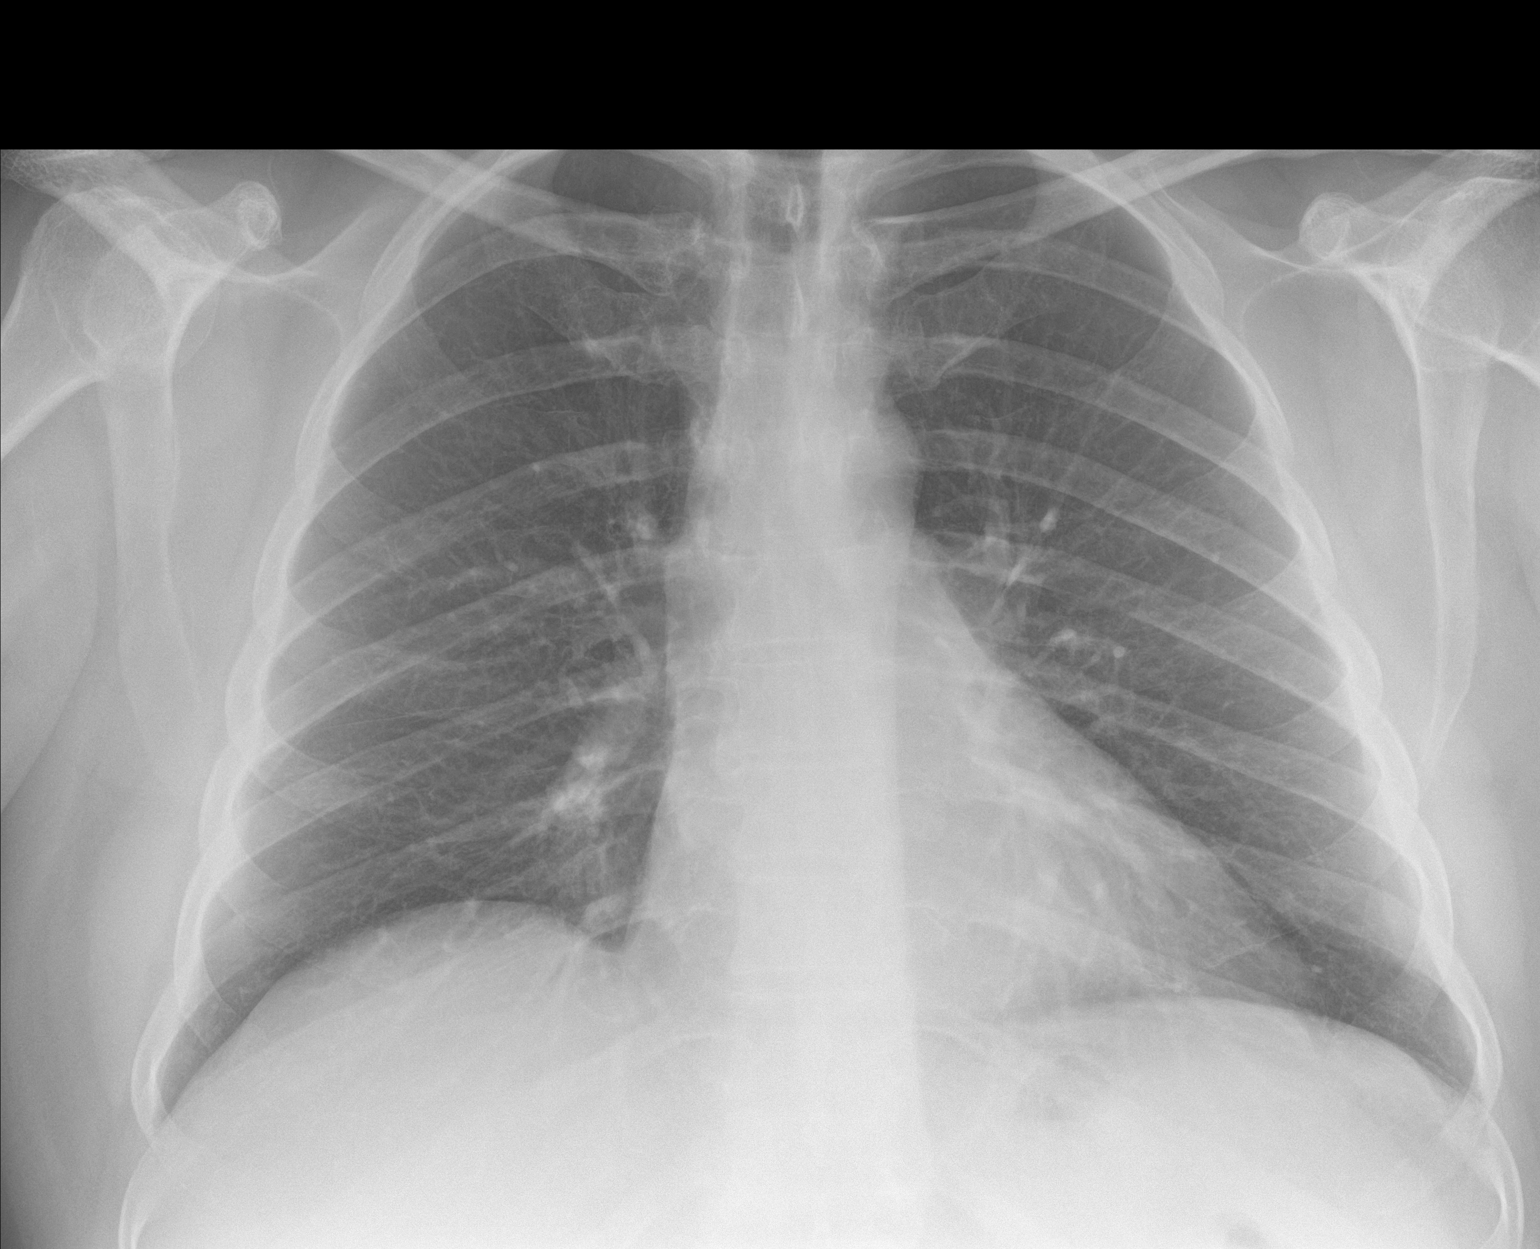

[chest lat]
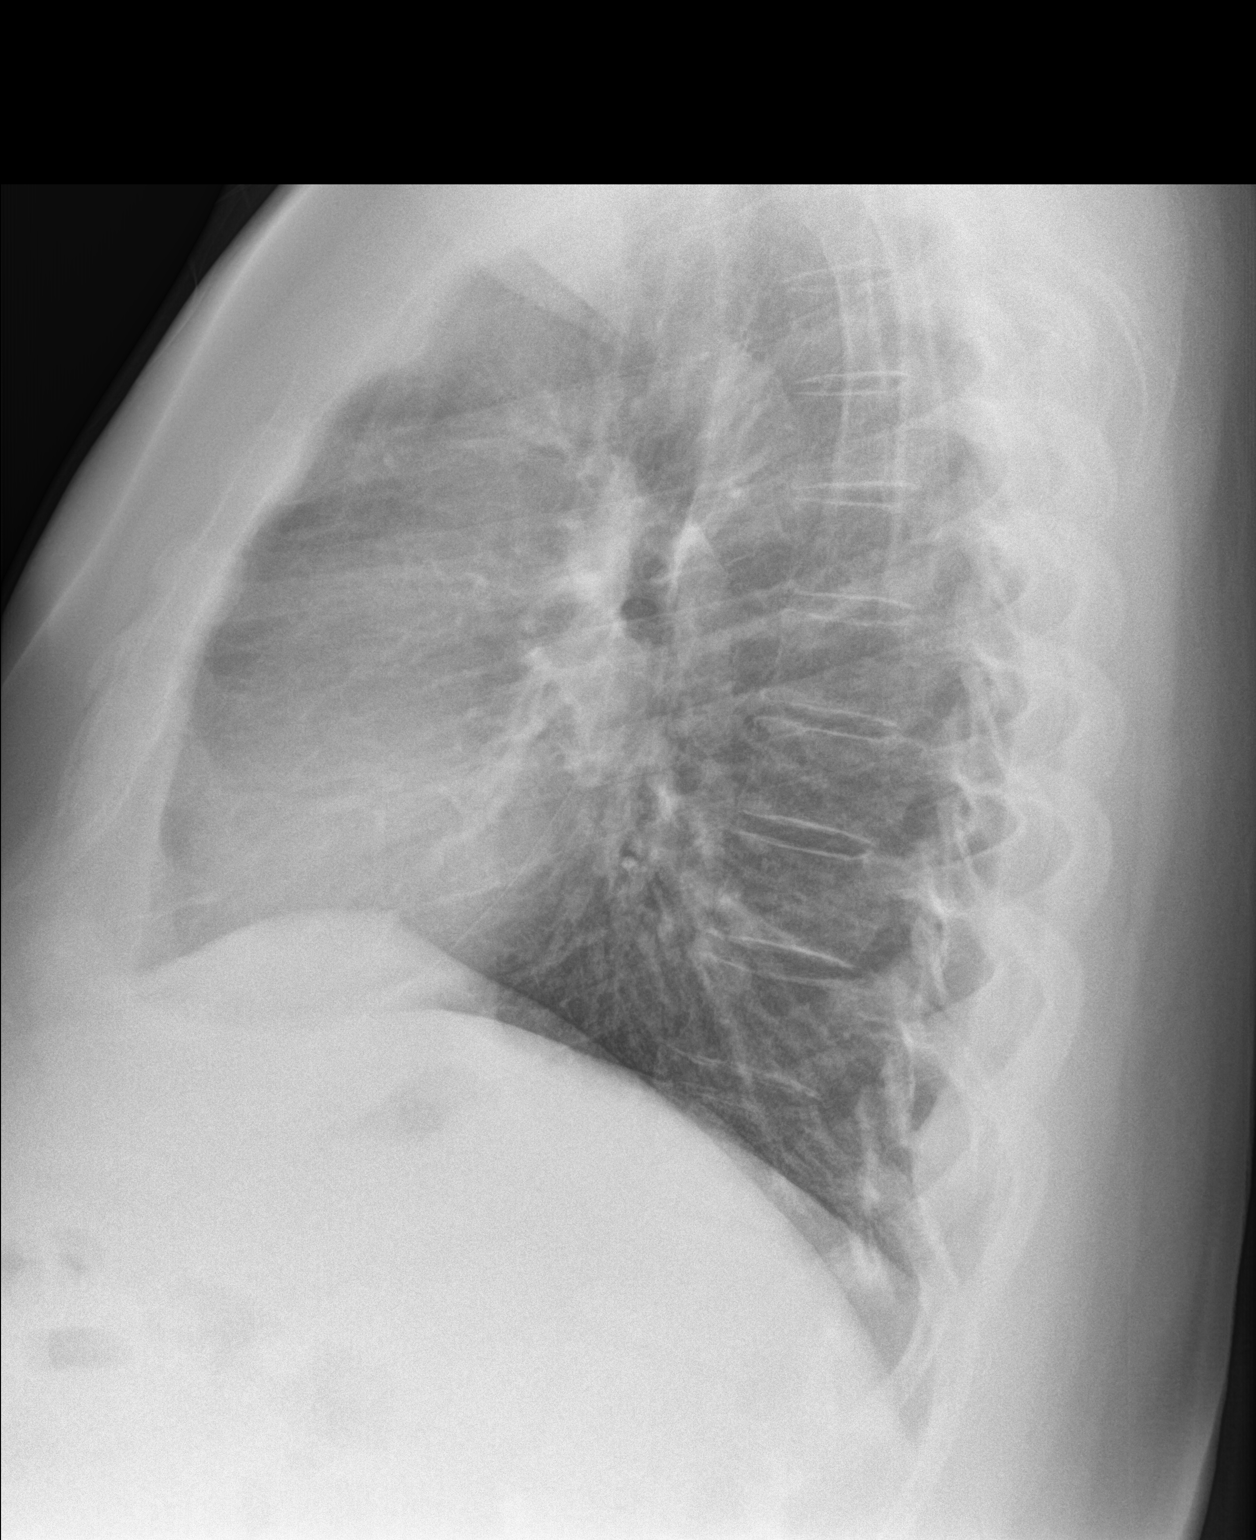

[2 of 2 positions shown; findings below may reference images not displayed]

FINDINGS: Lungs are clear. Heart size and pulmonary vascularity are normal. No
adenopathy. No bone lesions.
IMPRESSION: No edema or consolidation.

## 2018-02-07 ENCOUNTER — Encounter: Payer: Self-pay | Admitting: Family Medicine

## 2018-09-09 DIAGNOSIS — B356 Tinea cruris: Secondary | ICD-10-CM | POA: Diagnosis not present

## 2018-09-09 DIAGNOSIS — D1801 Hemangioma of skin and subcutaneous tissue: Secondary | ICD-10-CM | POA: Diagnosis not present

## 2018-09-09 DIAGNOSIS — L821 Other seborrheic keratosis: Secondary | ICD-10-CM | POA: Diagnosis not present

## 2018-09-09 DIAGNOSIS — D225 Melanocytic nevi of trunk: Secondary | ICD-10-CM | POA: Diagnosis not present

## 2018-09-09 DIAGNOSIS — L570 Actinic keratosis: Secondary | ICD-10-CM | POA: Diagnosis not present

## 2018-09-25 ENCOUNTER — Other Ambulatory Visit: Payer: Self-pay | Admitting: Urology

## 2018-09-25 DIAGNOSIS — N401 Enlarged prostate with lower urinary tract symptoms: Secondary | ICD-10-CM

## 2018-10-19 ENCOUNTER — Other Ambulatory Visit: Payer: Self-pay | Admitting: Urology

## 2018-10-19 DIAGNOSIS — N281 Cyst of kidney, acquired: Secondary | ICD-10-CM

## 2018-10-20 ENCOUNTER — Ambulatory Visit
Admission: RE | Admit: 2018-10-20 | Discharge: 2018-10-20 | Disposition: A | Discharge status: Home / Self Care | Payer: Federal, State, Local not specified - PPO | Source: Ambulatory Visit | Attending: Urology | Admitting: Urology

## 2018-10-20 ENCOUNTER — Other Ambulatory Visit: Payer: Self-pay

## 2018-10-20 DIAGNOSIS — N281 Cyst of kidney, acquired: Secondary | ICD-10-CM | POA: Diagnosis not present

## 2018-10-20 NOTE — Progress Notes (Signed)
10:05 PM   Richard Case 1955/10/15 [id number]  Referring provider: [name], MD [address] [place],  [place] [address]  Chief Complaint  Patient presents with  . Benign Prostatic Hypertrophy    HPI: Patient is a 63 year old male with a history nephrolithiasis, complex renal cyst and BPH with LU TS who presents today to for one year follow up.      History of nephrolithiasis  CT scan noted a right UVJ (5 mm x 4 mm x 4 mm) stone causing moderate right hydronephrosis and moderate right hydroureter.  Patient underwent right ESWL for definitive treatment of his UVJ stone.  Patient's stone analysis revealed a stone composition of 93% calcium oxalate monohydrate, 5% calcium oxalate dihydrate and 2% calcium phosphate carbonate.  His RUS on 10/21/2018 did not demonstrate any renal stones or hydronephrosis.    Complex renal cyst Contrast CT scan 08/2015 noted small benign left renal cysts and probable tiny subcentimeter cyst in the right kidney which are too small to characterize. No definite evidence of a renal neoplasm. He also had a tiny nonobstructive left renal calculus without evidence of hydronephrosis. Hepatic stenosis. Cholelithiasis. Colonic diverticulosis and aortic atherosclerosis.  RUS performed on 08/29/2016 noted small renal cysts in each kidney. A septation is seen in the small right renal cyst. Extrarenal pelvis on the right. Study otherwise unremarkable.  RUS in 08/2017 revealed small simple and minimally complex renal cysts without appreciable interval change. No suspicious renal masses.  Normal bladder.  RUS on 10/20/2018 demonstrated bilateral kidney cysts. Minimal complicated cyst in the right kidney is unchanged.  BPH WITH LUTS  (prostate and/or bladder) IPSS score: 15/2     PVR:   8 mL    Previous score: 16/3  Previous PVR: 93 mL  Major complaint(s):  Urgency and intermittency x few years. Denies any dysuria, hematuria or suprapubic pain.   Currently  taking: tamsulosin 0.4 mg daily   Denies any recent fevers, chills, nausea or vomiting.  His father had prostate cancer, but more likely died from lung cancer.    IPSS    Row Name 10/21/18 1400         International Prostate Symptom Score   How often have you had the sensation of not emptying your bladder?  More than half the time     How often have you had to urinate less than every two hours?  About half the time     How often have you found you stopped and started again several times when you urinated?  Less than 1 in 5 times     How often have you found it difficult to postpone urination?  More than half the time     How often have you had a weak urinary stream?  Less than half the time     How often have you had to strain to start urination?  Less than 1 in 5 times     How many times did you typically get up at night to urinate?  None     Total IPSS Score  15       Quality of Life due to urinary symptoms   If you were to spend the rest of your life with your urinary condition just the way it is now how would you feel about that?  Mostly Satisfied        Score:  1-7 Mild 8-19 Moderate 20-35 Severe  He mentions today that he was experiencing  the onset of left testicular pain for the last 2 weeks.  He denies any injury to the area.  He cannot pinpoint what makes the left testicular pain worse or what relieves the testicular pain. He has not noted a bulge in the left inguinal area.  Scrotal ultrasound today 10/21/2018 revealed no ultrasound abnormality of the scrotum or testicles to explain left-sided testicular pain. Normal, symmetric appearance of the testicles with Doppler flow present bilaterally.  PMH: Past Medical History:  Diagnosis Date  . ADD (attention deficit disorder) 2011   no records of workup; strattera caused urinary retention, not interested in habit forming medication  . Atrial ectopy 2011   improved with CPAP, documented by holter  . Benign colon polyp  ?2014   hyperplastic  . BPH (benign prostatic hypertrophy)    per prior PCP records  . Diabetes mellitus without complication ([place])   . Elevated blood pressure (not hypertension)   . Erectile dysfunction   . GERD (gastroesophageal reflux disease)   . Gout   . Heart murmur longstanding  . History of kidney stones 2008, 2017  . History of pneumonia 2009  . HLD (hyperlipidemia)   . Kidney stones    HX  . Male erectile dysfunction   . Obesity, Class I, BMI 30-34.9   . OSA (obstructive sleep apnea) 2010   OSA with stabilization at CPAP 7cm  . Peyronie disease    per prior pcp records  . Polycythemia    ?OSA related  . Prediabetes   . Rosacea    per prior pcp records  . Seasonal allergic rhinitis   . 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 CARDIOVASCULAR STRESS TEST  07/2017   low risk stress test, hypertensive response ([place])  . COLONOSCOPY  2009   rec rpt 5 yrs per prior PCP records but no actual report  . COLONOSCOPY  03/2012   mild diverticulosis, rpt 5 yrs (Dr Hulan Saas in [place])  . COLONOSCOPY WITH PROPOFOL N/A 12/18/2017   TAx2, HP, diverticulosis, rpt 5 yrs Vicente Males, Bailey Mech, MD)  . EXTRACORPOREAL SHOCK WAVE LITHOTRIPSY Right 04/06/2015   Procedure: EXTRACORPOREAL SHOCK WAVE LITHOTRIPSY (ESWL);  Surgeon: Nickie Retort, MD;  Location: [facility] ORS;  Service: Urology;  Laterality: Right;  . MOHS SURGERY  2015   SCC of nose  . TONSILLECTOMY      Home Medications:  Allergies as of 10/21/2018      Reactions   Strattera [atomoxetine Hcl] Other (See Comments)   Urinary retention      Medication List       Accurate as of October 21, 2018 11:59 PM. If you have any questions, ask your nurse or doctor.        allopurinol 300 MG tablet Commonly known as: ZYLOPRIM Take 1 tablet (300 mg total) by mouth daily.   aspirin 81 MG chewable tablet Chew 1 tablet (81 mg total) by mouth  daily.   atorvastatin 20 MG tablet Commonly known as: LIPITOR Take 1 tablet (20 mg total) by mouth daily.   ibuprofen 200 MG tablet Commonly known as: ADVIL Take 400 mg by mouth every 6 (six) hours as needed for moderate pain.   ketoconazole 2 % cream Commonly known as: NIZORAL APPLY TO AFFECTED AREA TWICE A DAY   metFORMIN 500 MG tablet Commonly known as: GLUCOPHAGE Take 1 tablet (500 mg total) by mouth daily with breakfast.  scopolamine 1 MG/3DAYS Commonly known as: Transderm-Scop (1.5 MG) Place 1 patch (1.5 mg total) onto the skin every 3 (three) days.   tamsulosin 0.4 MG Caps capsule Commonly known as: FLOMAX TAKE 1 CAPSULE BY MOUTH EVERY DAY   triamcinolone cream 0.1 % Commonly known as: KENALOG APPLY TO AFFECTED AREA TWICE A DAY       Allergies:  Allergies  Allergen Reactions  . Strattera [Atomoxetine Hcl] Other (See Comments)    Urinary retention    Family History: Family History  Problem Relation Age of Onset  . Cancer Father 16       prostate  . CAD Father        possibly?  . Prostate cancer Father   . Heart Problems Father   . Cancer Mother        lung  . Lung disease Mother   . Stroke Neg Hx   . Diabetes Neg Hx   . Kidney disease Neg Hx   . Kidney cancer Neg Hx   . Bladder Cancer Neg Hx     Social History:  reports that he has never smoked. He has never used smokeless tobacco. He reports current alcohol use. He reports that he does not use drugs.  ROS: UROLOGY Frequent Urination?: No Hard to postpone urination?: Yes Burning/pain with urination?: No Get up at night to urinate?: No Leakage of urine?: No Urine stream starts and stops?: No Trouble starting stream?: No Do you have to strain to urinate?: No Blood in urine?: No Urinary tract infection?: No Sexually transmitted disease?: No Injury to kidneys or bladder?: No Painful intercourse?: No Weak stream?: No Erection problems?: Yes Penile pain?: No  Gastrointestinal Nausea?:  No Vomiting?: No Indigestion/heartburn?: No Diarrhea?: No Constipation?: No  Constitutional Fever: No Night sweats?: No Weight loss?: No Fatigue?: Yes  Skin Skin rash/lesions?: No Itching?: No  Eyes Blurred vision?: Yes Double vision?: Yes  Ears/Nose/Throat Sore throat?: No Sinus problems?: Yes  Hematologic/Lymphatic Swollen glands?: No Easy bruising?: Yes  Cardiovascular Leg swelling?: No Chest pain?: No  Respiratory Cough?: No Shortness of breath?: No  Endocrine Excessive thirst?: Yes  Musculoskeletal Back pain?: No Joint pain?: No  Neurological Headaches?: No Dizziness?: No  Psychologic Depression?: No Anxiety?: No  Physical Exam: BP (!) 142/87 (BP Location: Left Arm, Patient Position: Sitting, Cuff Size: Normal)   Pulse (!) 108   Ht 5\' 10"  (1.778 m)   Wt 247 lb (112 kg)   BMI 35.44 kg/m   Constitutional:  Well nourished. Alert and oriented, No acute distress. HEENT: [place] AT, moist mucus membranes.  Trachea midline, no masses. Cardiovascular: No clubbing, cyanosis, or edema. Respiratory: Normal respiratory effort, no increased work of breathing. GI: Abdomen is soft, non tender, non distended, no abdominal masses. Liver and spleen not palpable.  No hernias appreciated.  Stool sample for occult testing is not indicated.   GU: No CVA tenderness.  No bladder fullness or masses.  Patient with circumcised phallus.  Urethral meatus is patent.  No penile discharge. No penile lesions or rashes. Scrotum without lesions, cysts, rashes and/or edema.  Testicles are located scrotally bilaterally. No masses are appreciated in the testicles. Left and right epididymis are normal. Rectal: Patient with  normal sphincter tone. Anus and perineum without scarring or rashes. No rectal masses are appreciated. Prostate is approximately 55 grams, no nodules are appreciated. Seminal vesicles could not be palpated Skin: No rashes, bruises or suspicious lesions. Lymph: No  inguinal adenopathy. Neurologic: Grossly intact, no focal deficits,  moving all 4 extremities. Psychiatric: Normal mood and affect.  Laboratory Data: Lab Results  Component Value Date   WBC 6.1 07/23/2017   HGB 16.6 07/23/2017   HCT 49.0 07/23/2017   MCV 93.7 07/23/2017   PLT 202.0 07/23/2017   Lab Results  Component Value Date   CREATININE 1.34 09/23/2017   Lab Results  Component Value Date   PSA 1.04 04/01/2016   PSA 1.16 03/22/2015   PSA 1.010 05/04/2013   Lab Results  Component Value Date   HGBA1C 5.9 07/23/2017     Results for orders placed or performed in visit on 10/21/18  PSA  Result Value Ref Range   Prostate Specific Ag, Serum 1.3 0.0 - 4.0 ng/mL  Bladder Scan (Post Void Residual) in office  Result Value Ref Range   Scan Result 8    I have reviewed the labs  Pertinent Imaging: CLINICAL DATA:  Left testicle pain for 2 weeks  EXAM: SCROTAL ULTRASOUND  DOPPLER ULTRASOUND OF THE TESTICLES  TECHNIQUE: Complete ultrasound examination of the testicles, epididymis, and other scrotal structures was performed. Color and spectral Doppler ultrasound were also utilized to evaluate blood flow to the testicles.  COMPARISON:  None.  FINDINGS: Right testicle  Measurements: 4.2 x 2.4 x 3.4 cm. No mass or microlithiasis visualized.  Left testicle  Measurements: 4.7 x 2.4 x 3.3 cm. No mass or microlithiasis visualized.  Right epididymis:  Normal in size and appearance.  Left epididymis:  Normal in size and appearance.  Hydrocele:  None visualized.  Trace physiologic fluid bilaterally.  Varicocele:  None visualized.  Pulsed Doppler interrogation of both testes demonstrates normal low resistance arterial and venous waveforms bilaterally.  IMPRESSION: No ultrasound abnormality of the scrotum or testicles to explain left-sided testicular pain. Normal, symmetric appearance of the testicles with Doppler flow present bilaterally.    Electronically Signed   By: Eddie Candle M.D.   On: 10/21/2018 16:27 CLINICAL DATA:  Bilateral renal cysts  EXAM: RENAL / URINARY TRACT ULTRASOUND COMPLETE  COMPARISON:  September 02, 2017  FINDINGS: Right Kidney:  Renal measurements: 11.7 x 5.4 x 5.3 cm = volume: 173 mL. Normal echotexture is identified. There is a 1.1 x 1.1 x 1 cm minimal complicated cyst in the upper to midpole not significantly changed compared prior ultrasound. No hydronephrosis.  Left Kidney:  Renal measurements: 13.4 x 5.8 x 4 9 cm = volume: 197 mL. Normal echotexture. Simple cysts are identified in the left kidney, largest in the upper pole measuring 2.6 cm. No hydronephrosis.  Bladder:  Appears normal for degree of bladder distention.  IMPRESSION: Bilateral kidney cysts. Minimal complicated cyst in the right kidney is unchanged.   Electronically Signed   By: Abelardo Diesel M.D.   On: 10/20/2018 16:32 I have independently reviewed the films and did not appreciate any abnormalities on scrotal ultrasound and the right complicated cyst remains unchanged.    Assessment & Plan:    1. History of nephrolithiasis No stone seen on RUS Repeat RUS in one year Advised to contact our office or seek treatment in the ED if becomes febrile or pain/ vomiting are difficult control in order to arrange for emergent/urgent intervention  2. BPH with LUTS IPSS score is 15/2, it is stable Continue conservative management, avoiding bladder irritants and timed voiding's Most bothersome symptoms is/are urgency Continue tamsulosin 0.4 mg daily  RTC in 12 months for IPSS, PSA, PVR and exam   3. Peyronie's disease:  Patient is not interested in pursuing  any treatment options at this time.    4. Minimally complex right renal cyst Cyst remains unchanged   Return in about 1 year (around 10/21/2019) for IPSS, PSA, PVR, RUS and exam.  These notes generated with voice recognition software. I apologize for  typographical errors.  Zara Council, PA-C  [facility] [address] [place],  [address] [phone]

## 2018-10-21 ENCOUNTER — Ambulatory Visit: Payer: Federal, State, Local not specified - PPO | Admitting: Urology

## 2018-10-21 ENCOUNTER — Ambulatory Visit
Admission: RE | Admit: 2018-10-21 | Discharge: 2018-10-21 | Disposition: A | Discharge status: Home / Self Care | Payer: Federal, State, Local not specified - PPO | Source: Ambulatory Visit | Attending: Urology | Admitting: Urology

## 2018-10-21 ENCOUNTER — Other Ambulatory Visit: Payer: Self-pay | Admitting: Urology

## 2018-10-21 ENCOUNTER — Encounter: Payer: Self-pay | Admitting: Urology

## 2018-10-21 VITALS — BP 142/87 | HR 108 | Ht 70.0 in | Wt 247.0 lb

## 2018-10-21 DIAGNOSIS — N50812 Left testicular pain: Secondary | ICD-10-CM | POA: Diagnosis not present

## 2018-10-21 DIAGNOSIS — Z87442 Personal history of urinary calculi: Secondary | ICD-10-CM

## 2018-10-21 DIAGNOSIS — D2932 Benign neoplasm of left epididymis: Secondary | ICD-10-CM | POA: Diagnosis not present

## 2018-10-21 DIAGNOSIS — N281 Cyst of kidney, acquired: Secondary | ICD-10-CM

## 2018-10-21 DIAGNOSIS — N138 Other obstructive and reflux uropathy: Secondary | ICD-10-CM | POA: Diagnosis not present

## 2018-10-21 DIAGNOSIS — N486 Induration penis plastica: Secondary | ICD-10-CM | POA: Diagnosis not present

## 2018-10-21 DIAGNOSIS — N401 Enlarged prostate with lower urinary tract symptoms: Secondary | ICD-10-CM

## 2018-10-21 LAB — BLADDER SCAN AMB NON-IMAGING: Scan Result: 8

## 2018-10-22 LAB — PSA: Prostate Specific Ag, Serum: 1.3 ng/mL (ref 0.0–4.0)

## 2018-10-27 ENCOUNTER — Telehealth: Payer: Self-pay | Admitting: Urology

## 2018-10-27 NOTE — Telephone Encounter (Signed)
Appt's  made and mailed.

## 2018-10-27 NOTE — Telephone Encounter (Signed)
-----   Message from Nori Riis, PA-C sent at 10/26/2018  8:12 AM EDT ----- Would you please schedule [name] for a one year follow up for RUS, PSA and office visit?

## 2018-11-02 ENCOUNTER — Other Ambulatory Visit: Payer: Self-pay | Admitting: *Deleted

## 2018-11-02 DIAGNOSIS — R3912 Poor urinary stream: Secondary | ICD-10-CM

## 2018-11-02 DIAGNOSIS — N401 Enlarged prostate with lower urinary tract symptoms: Secondary | ICD-10-CM

## 2018-11-02 MED ORDER — TAMSULOSIN HCL 0.4 MG PO CAPS: ORAL_CAPSULE | ORAL | 3 refills | Status: DC

## 2018-11-03 ENCOUNTER — Encounter: Payer: Self-pay | Admitting: Family Medicine

## 2018-11-03 ENCOUNTER — Other Ambulatory Visit: Payer: Self-pay | Admitting: Family Medicine

## 2018-11-03 ENCOUNTER — Other Ambulatory Visit: Payer: Self-pay

## 2018-11-03 ENCOUNTER — Ambulatory Visit (INDEPENDENT_AMBULATORY_CARE_PROVIDER_SITE_OTHER): Payer: Federal, State, Local not specified - PPO | Admitting: Family Medicine

## 2018-11-03 VITALS — BP 124/70 | HR 84 | Temp 98.4°F | Ht 69.75 in | Wt 246.3 lb

## 2018-11-03 DIAGNOSIS — D751 Secondary polycythemia: Secondary | ICD-10-CM | POA: Diagnosis not present

## 2018-11-03 DIAGNOSIS — N401 Enlarged prostate with lower urinary tract symptoms: Secondary | ICD-10-CM | POA: Diagnosis not present

## 2018-11-03 DIAGNOSIS — H9313 Tinnitus, bilateral: Secondary | ICD-10-CM

## 2018-11-03 DIAGNOSIS — Z23 Encounter for immunization: Secondary | ICD-10-CM

## 2018-11-03 DIAGNOSIS — M109 Gout, unspecified: Secondary | ICD-10-CM

## 2018-11-03 DIAGNOSIS — E1169 Type 2 diabetes mellitus with other specified complication: Secondary | ICD-10-CM | POA: Diagnosis not present

## 2018-11-03 DIAGNOSIS — E785 Hyperlipidemia, unspecified: Secondary | ICD-10-CM

## 2018-11-03 DIAGNOSIS — R3912 Poor urinary stream: Secondary | ICD-10-CM

## 2018-11-03 DIAGNOSIS — E119 Type 2 diabetes mellitus without complications: Secondary | ICD-10-CM | POA: Diagnosis not present

## 2018-11-03 DIAGNOSIS — N281 Cyst of kidney, acquired: Secondary | ICD-10-CM

## 2018-11-03 DIAGNOSIS — Z0000 Encounter for general adult medical examination without abnormal findings: Secondary | ICD-10-CM

## 2018-11-03 MED ORDER — ATORVASTATIN CALCIUM 20 MG PO TABS: 20.0000 mg | ORAL_TABLET | Freq: Every day | ORAL | 0 refills | Status: DC

## 2018-11-03 MED ORDER — TAMSULOSIN HCL 0.4 MG PO CAPS: ORAL_CAPSULE | ORAL | 4 refills | Status: DC

## 2018-11-03 MED ORDER — ALLOPURINOL 300 MG PO TABS: 300.0000 mg | ORAL_TABLET | Freq: Every day | ORAL | 4 refills | Status: DC

## 2018-11-03 MED ORDER — METFORMIN HCL 500 MG PO TABS: 500.0000 mg | ORAL_TABLET | Freq: Every day | ORAL | 4 refills | Status: DC

## 2018-11-03 NOTE — Assessment & Plan Note (Signed)
Weight gain noted this past year.

## 2018-11-03 NOTE — Assessment & Plan Note (Signed)
Chronic, stable. Continue current regimen. Update A1c.

## 2018-11-03 NOTE — Assessment & Plan Note (Signed)
Followed by urology, latest check this month and stable.

## 2018-11-03 NOTE — Assessment & Plan Note (Signed)
Chronic, stable. No recent gout flares since starting allopurinol. Update urate level.

## 2018-11-03 NOTE — Assessment & Plan Note (Signed)
Chronic, continue lipitor 20mg  daily (started by cardiology 2019). The 10-year ASCVD risk score Mikey Bussing DC Brooke Bonito., et al., 2013) is: 20.7%   Values used to calculate the score:     Age: 63 years     Sex: Male     Is Non-Hispanic African American: No     Diabetic: Yes     Tobacco smoker: No     Systolic Blood Pressure: [id number] mmHg     Is BP treated: No     HDL Cholesterol: 43.5 mg/dL     Total Cholesterol: 181 mg/dL

## 2018-11-03 NOTE — Progress Notes (Signed)
This visit was conducted in person.  BP 124/70 (BP Location: Left Arm, Patient Position: Sitting, Cuff Size: Large)   Pulse 84   Temp 98.4 F (36.9 C) (Temporal)   Ht 5' 9.75" (1.772 m)   Wt 246 lb 5 oz (111.7 kg)   SpO2 96%   BMI 35.60 kg/m    CC: CPE Subjective:    Patient ID: Richard Case, male    DOB: [birth year], 63 y.o.   MRN: [id number]  HPI: Richard Case is a 63 y.o. male presenting on 11/03/2018 for Annual Exam   Lives in the [place]. He has been gardening.  Has been checking sugars at home through home meter provided by insurance (and A1c). Will send me results.  Ongoing tinnitus affecting hearing. Would like audiology referral.   Preventative: COLONOSCOPY WITH PROPOFOL 12/18/2017 - TAx2, HP, diverticulosis, rpt 5 yrs Vicente Males, Bailey Mech, MD) Prostate cancer screening -established with urology [facility]) - followed there. Flu shot yearly  Pneumovax 2017 Tdap 2017  Shingrix - discussed. Will start series today Seat belt use discussed  Sunscreen use discussed. No changing moles on skin. Saw dermatology Non smoker  Alcohol - rarely Dentist q6 mo  Eye exam - overdue   Lives with wife [name], 1 dog Occupation: retired - Civil engineer, contracting for [employer]  Edu: BS Activity: walking dog  Diet: good water, fruits/vegetables daily     Relevant past medical, surgical, family and social history reviewed and updated as indicated. Interim medical history since our last visit reviewed. Allergies and medications reviewed and updated. Outpatient Medications Prior to Visit  Medication Sig Dispense Refill  . aspirin 81 MG chewable tablet Chew 1 tablet (81 mg total) by mouth daily.    Marland Kitchen ibuprofen (ADVIL,MOTRIN) 200 MG tablet Take 400 mg by mouth every 6 (six) hours as needed for moderate pain.    Marland Kitchen ketoconazole (NIZORAL) 2 % cream APPLY TO AFFECTED AREA TWICE A DAY    . triamcinolone cream (KENALOG) 0.1 % As needed    . allopurinol (ZYLOPRIM) 300 MG tablet Take 1  tablet (300 mg total) by mouth daily. 90 tablet 3  . atorvastatin (LIPITOR) 20 MG tablet Take 1 tablet (20 mg total) by mouth daily. 90 tablet 3  . metFORMIN (GLUCOPHAGE) 500 MG tablet Take 1 tablet (500 mg total) by mouth daily with breakfast. 90 tablet 3  . tamsulosin (FLOMAX) 0.4 MG CAPS capsule TAKE 1 CAPSULE BY MOUTH EVERY DAY 90 capsule 3  . scopolamine (TRANSDERM-SCOP, 1.5 MG,) 1 MG/3DAYS Place 1 patch (1.5 mg total) onto the skin every 3 (three) days. (Patient not taking: Reported on 11/03/2018) 4 patch 0   No facility-administered medications prior to visit.      Per HPI unless specifically indicated in ROS section below Review of Systems  Constitutional: Negative for activity change, appetite change, chills, fatigue, fever and unexpected weight change.  HENT: Negative for hearing loss.   Eyes: Negative for visual disturbance.  Respiratory: Negative for cough, chest tightness, shortness of breath and wheezing.   Cardiovascular: Negative for chest pain, palpitations and leg swelling.  Gastrointestinal: Negative for abdominal distention, abdominal pain, blood in stool, constipation, diarrhea, nausea and vomiting.  Genitourinary: Negative for difficulty urinating and hematuria.  Musculoskeletal: Negative for arthralgias, myalgias and neck pain.  Skin: Negative for rash.  Neurological: Negative for dizziness, seizures, syncope and headaches.  Hematological: Negative for adenopathy. Does not bruise/bleed easily.  Psychiatric/Behavioral: Negative for dysphoric mood. The patient is not nervous/anxious.    Objective:  BP 124/70 (BP Location: Left Arm, Patient Position: Sitting, Cuff Size: Large)   Pulse 84   Temp 98.4 F (36.9 C) (Temporal)   Ht 5' 9.75" (1.772 m)   Wt 246 lb 5 oz (111.7 kg)   SpO2 96%   BMI 35.60 kg/m   Wt Readings from Last 3 Encounters:  11/03/18 246 lb 5 oz (111.7 kg)  10/21/18 247 lb (112 kg)  12/18/17 230 lb (104.3 kg)    Physical Exam Vitals signs  and nursing note reviewed.  Constitutional:      General: He is not in acute distress.    Appearance: Normal appearance. He is well-developed. He is obese. He is not ill-appearing.  HENT:     Head: Normocephalic and atraumatic.     Right Ear: Hearing, tympanic membrane, ear canal and external ear normal.     Left Ear: Hearing, tympanic membrane, ear canal and external ear normal.     Nose: Nose normal.     Mouth/Throat:     Mouth: Mucous membranes are moist.     Pharynx: Oropharynx is clear. Uvula midline. No posterior oropharyngeal erythema.  Eyes:     General: No scleral icterus.    Conjunctiva/sclera: Conjunctivae normal.     Pupils: Pupils are equal, round, and reactive to light.  Neck:     Musculoskeletal: Normal range of motion and neck supple.     Vascular: No carotid bruit.  Cardiovascular:     Rate and Rhythm: Normal rate and regular rhythm.     Pulses: Normal pulses.          Radial pulses are 2+ on the right side and 2+ on the left side.     Heart sounds: Normal heart sounds. No murmur.  Pulmonary:     Effort: Pulmonary effort is normal. No respiratory distress.     Breath sounds: Normal breath sounds. No wheezing, rhonchi or rales.  Abdominal:     General: Abdomen is flat. Bowel sounds are normal. There is no distension.     Palpations: Abdomen is soft. There is no mass.     Tenderness: There is no abdominal tenderness. There is no guarding or rebound.     Hernia: No hernia is present.  Musculoskeletal: Normal range of motion.     Right lower leg: No edema.     Left lower leg: No edema.  Lymphadenopathy:     Cervical: No cervical adenopathy.  Skin:    General: Skin is warm and dry.     Findings: No rash.  Neurological:     General: No focal deficit present.     Mental Status: He is alert and oriented to person, place, and time.     Comments: CN grossly intact, station and gait intact  Psychiatric:        Mood and Affect: Mood normal.        Behavior: Behavior  normal.        Thought Content: Thought content normal.        Judgment: Judgment normal.       Results for orders placed or performed in visit on 10/21/18  PSA  Result Value Ref Range   Prostate Specific Ag, Serum 1.3 0.0 - 4.0 ng/mL  Bladder Scan (Post Void Residual) in office  Result Value Ref Range   Scan Result 8    Lab Results  Component Value Date   HGBA1C 5.9 07/23/2017    Assessment & Plan:   Problem List Items Addressed This  Visit    Tinnitus    Chronic, persists. Hasn't noticed a difference since starting aspirin 81mg  daily. Will refer to audiology for baseline evaluation given endorsed hearing difficulties.       Relevant Orders   Ambulatory referral to Audiology   Severe obesity (BMI 35.0-39.9) with comorbidity ([place])    Weight gain noted this past year.       Relevant Medications   metFORMIN (GLUCOPHAGE) 500 MG tablet   Polycythemia   Relevant Orders   CBC with Differential/Platelet   Hyperlipidemia associated with type 2 diabetes mellitus ([place])    Chronic, continue lipitor 20mg  daily (started by cardiology 2019). The 10-year ASCVD risk score Mikey Bussing DC Brooke Bonito., et al., 2013) is: 20.7%   Values used to calculate the score:     Age: 44 years     Sex: Male     Is Non-Hispanic African American: No     Diabetic: Yes     Tobacco smoker: No     Systolic Blood Pressure: [id number] mmHg     Is BP treated: No     HDL Cholesterol: 43.5 mg/dL     Total Cholesterol: 181 mg/dL       Relevant Medications   metFORMIN (GLUCOPHAGE) 500 MG tablet   atorvastatin (LIPITOR) 20 MG tablet   Other Relevant Orders   Lipid panel   Comprehensive metabolic panel   Health maintenance examination - Primary    Preventative protocols reviewed and updated unless pt declined. Discussed healthy diet and lifestyle.       Gout    Chronic, stable. No recent gout flares since starting allopurinol. Update urate level.       Relevant Orders   Uric acid   Controlled diabetes mellitus type  II without complication (HCC)    Chronic, stable. Continue current regimen. Update A1c.       Relevant Medications   metFORMIN (GLUCOPHAGE) 500 MG tablet   atorvastatin (LIPITOR) 20 MG tablet   Other Relevant Orders   Hemoglobin A1c   Complex renal cyst    Followed by urology, latest check this month and stable.       BPH (benign prostatic hyperplasia)    Continues flomax followed by urology.       Relevant Medications   tamsulosin (FLOMAX) 0.4 MG CAPS capsule    Other Visit Diagnoses    Need for influenza vaccination       Relevant Orders   Flu Vaccine QUAD 36+ mos IM (Completed)   Need for shingles vaccine       Relevant Orders   Varicella-zoster vaccine IM (Completed)       Meds ordered this encounter  Medications  . tamsulosin (FLOMAX) 0.4 MG CAPS capsule    Sig: TAKE 1 CAPSULE BY MOUTH EVERY DAY    Dispense:  90 capsule    Refill:  4  . allopurinol (ZYLOPRIM) 300 MG tablet    Sig: Take 1 tablet (300 mg total) by mouth daily.    Dispense:  90 tablet    Refill:  4  . metFORMIN (GLUCOPHAGE) 500 MG tablet    Sig: Take 1 tablet (500 mg total) by mouth daily with breakfast.    Dispense:  90 tablet    Refill:  4  . atorvastatin (LIPITOR) 20 MG tablet    Sig: Take 1 tablet (20 mg total) by mouth daily.    Dispense:  90 tablet    Refill:  0   Orders Placed This Encounter  Procedures  .  Flu Vaccine QUAD 36+ mos IM  . Varicella-zoster vaccine IM  . Lipid panel  . Comprehensive metabolic panel  . Hemoglobin A1c  . Uric acid  . CBC with Differential/Platelet  . Ambulatory referral to Audiology    Referral Priority:   Routine    Referral Type:   Audiology Exam    Referral Reason:   Specialty Services Required    Number of Visits Requested:   1    Patient instructions: Flu shot today. First shingrix shot today.  Labs today.  We will refer you to audiologist for further evaluation [facility]).  Good to see you today. Call us with questions.  Return as  needed or in 1 year for next physical.   Follow up plan: Return in about 1 year (around 11/03/2019) for annual exam, prior fasting for blood work.  [name], MD

## 2018-11-03 NOTE — Assessment & Plan Note (Addendum)
Continues flomax followed by urology.

## 2018-11-03 NOTE — Patient Instructions (Addendum)
Flu shot today. First shingrix shot today.  Labs today.  We will refer you to audiologist for further evaluation [facility]).  Good to see you today. Call us with questions.  Return as needed or in 1 year for next physical.   Health Maintenance, Male Adopting a healthy lifestyle and getting preventive care are important in promoting health and wellness. Ask your health care provider about:  The right schedule for you to have regular tests and exams.  Things you can do on your own to prevent diseases and keep yourself healthy. What should I know about diet, weight, and exercise? Eat a healthy diet   Eat a diet that includes plenty of vegetables, fruits, low-fat dairy products, and lean protein.  Do not eat a lot of foods that are high in solid fats, added sugars, or sodium. Maintain a healthy weight Body mass index (BMI) is a measurement that can be used to identify possible weight problems. It estimates body fat based on height and weight. Your health care provider can help determine your BMI and help you achieve or maintain a healthy weight. Get regular exercise Get regular exercise. This is one of the most important things you can do for your health. Most adults should:  Exercise for at least 150 minutes each week. The exercise should increase your heart rate and make you sweat (moderate-intensity exercise).  Do strengthening exercises at least twice a week. This is in addition to the moderate-intensity exercise.  Spend less time sitting. Even light physical activity can be beneficial. Watch cholesterol and blood lipids Have your blood tested for lipids and cholesterol at 64 years of age, then have this test every 5 years. You may need to have your cholesterol levels checked more often if:  Your lipid or cholesterol levels are high.  You are older than 64 years of age.  You are at high risk for heart disease. What should I know about cancer screening? Many types of cancers  can be detected early and may often be prevented. Depending on your health history and family history, you may need to have cancer screening at various ages. This may include screening for:  Colorectal cancer.  Prostate cancer.  Skin cancer.  Lung cancer. What should I know about heart disease, diabetes, and high blood pressure? Blood pressure and heart disease  High blood pressure causes heart disease and increases the risk of stroke. This is more likely to develop in people who have high blood pressure readings, are of African descent, or are overweight.  Talk with your health care provider about your target blood pressure readings.  Have your blood pressure checked: ? Every 3-5 years if you are 90-21 years of age. ? Every year if you are 17 years old or older.  If you are between the ages of 74 and 56 and are a current or former smoker, ask your health care provider if you should have a one-time screening for abdominal aortic aneurysm (AAA). Diabetes Have regular diabetes screenings. This checks your fasting blood sugar level. Have the screening done:  Once every three years after age 60 if you are at a normal weight and have a low risk for diabetes.  More often and at a younger age if you are overweight or have a high risk for diabetes. What should I know about preventing infection? Hepatitis B If you have a higher risk for hepatitis B, you should be screened for this virus. Talk with your health care provider to find  out if you are at risk for hepatitis B infection. Hepatitis C Blood testing is recommended for:  Everyone born from [birth year] through 1966.  Anyone with known risk factors for hepatitis C. Sexually transmitted infections (STIs)  You should be screened each year for STIs, including gonorrhea and chlamydia, if: ? You are sexually active and are younger than 64 years of age. ? You are older than 64 years of age and your health care provider tells you that you are at  risk for this type of infection. ? Your sexual activity has changed since you were last screened, and you are at increased risk for chlamydia or gonorrhea. Ask your health care provider if you are at risk.  Ask your health care provider about whether you are at high risk for HIV. Your health care provider may recommend a prescription medicine to help prevent HIV infection. If you choose to take medicine to prevent HIV, you should first get tested for HIV. You should then be tested every 3 months for as long as you are taking the medicine. Follow these instructions at home: Lifestyle  Do not use any products that contain nicotine or tobacco, such as cigarettes, e-cigarettes, and chewing tobacco. If you need help quitting, ask your health care provider.  Do not use street drugs.  Do not share needles.  Ask your health care provider for help if you need support or information about quitting drugs. Alcohol use  Do not drink alcohol if your health care provider tells you not to drink.  If you drink alcohol: ? Limit how much you have to 0-2 drinks a day. ? Be aware of how much alcohol is in your drink. In the [place] one drink equals one 12 oz bottle of beer (355 mL), one 5 oz glass of wine (148 mL), or one 1 oz glass of hard liquor (44 mL). General instructions  Schedule regular health, dental, and eye exams.  Stay current with your vaccines.  Tell your health care provider if: ? You often feel depressed. ? You have ever been abused or do not feel safe at home. Summary  Adopting a healthy lifestyle and getting preventive care are important in promoting health and wellness.  Follow your health care provider's instructions about healthy diet, exercising, and getting tested or screened for diseases.  Follow your health care provider's instructions on monitoring your cholesterol and blood pressure. This information is not intended to replace advice given to you by your health care  provider. Make sure you discuss any questions you have with your health care provider. Document Released: 06/29/2007 Document Revised: 12/24/2017 Document Reviewed: 12/24/2017 Elsevier Patient Education  2020 [employer].

## 2018-11-03 NOTE — Assessment & Plan Note (Signed)
Chronic, persists. Hasn't noticed a difference since starting aspirin 81mg  daily. Will refer to audiology for baseline evaluation given endorsed hearing difficulties.

## 2018-11-03 NOTE — Assessment & Plan Note (Signed)
Preventative protocols reviewed and updated unless pt declined. Discussed healthy diet and lifestyle.  

## 2018-11-04 LAB — COMPREHENSIVE METABOLIC PANEL
ALT: 28 U/L (ref 0–53)
AST: 24 U/L (ref 0–37)
Albumin: 4.3 g/dL (ref 3.5–5.2)
Alkaline Phosphatase: 75 U/L (ref 39–117)
BUN: 16 mg/dL (ref 6–23)
CO2: 28 mEq/L (ref 19–32)
Calcium: 9.4 mg/dL (ref 8.4–10.5)
Chloride: 102 mEq/L (ref 96–112)
Creatinine, Ser: 1.24 mg/dL (ref 0.40–1.50)
GFR: 58.62 mL/min — ABNORMAL LOW (ref >60.00)
Glucose, Bld: 88 mg/dL (ref 70–99)
Potassium: 4.2 mEq/L (ref 3.5–5.1)
Sodium: 139 mEq/L (ref 135–145)
Total Bilirubin: 1.3 mg/dL — ABNORMAL HIGH (ref 0.2–1.2)
Total Protein: 6.9 g/dL (ref 6.0–8.3)

## 2018-11-04 LAB — CBC WITH DIFFERENTIAL/PLATELET
Basophils Absolute: 0.1 10*3/uL (ref 0.0–0.1)
Basophils Relative: 1.1 % (ref 0.0–3.0)
Eosinophils Absolute: 0.2 10*3/uL (ref 0.0–0.7)
Eosinophils Relative: 1.7 % (ref 0.0–5.0)
HCT: 47.2 % (ref 39.0–52.0)
Hemoglobin: 16.1 g/dL (ref 13.0–17.0)
Lymphocytes Relative: 22.5 % (ref 12.0–46.0)
Lymphs Abs: 2.1 10*3/uL (ref 0.7–4.0)
MCHC: 34.2 g/dL (ref 30.0–36.0)
MCV: 93.3 fl (ref 78.0–100.0)
Monocytes Absolute: 1.2 10*3/uL — ABNORMAL HIGH (ref 0.1–1.0)
Monocytes Relative: 12.9 % — ABNORMAL HIGH (ref 3.0–12.0)
Neutro Abs: 5.7 10*3/uL (ref 1.4–7.7)
Neutrophils Relative %: 61.8 % (ref 43.0–77.0)
Platelets: 203 10*3/uL (ref 150.0–400.0)
RBC: 5.06 Mil/uL (ref 4.22–5.81)
RDW: 13 % (ref 11.5–15.5)
WBC: 9.2 10*3/uL (ref 4.0–10.5)

## 2018-11-04 LAB — LIPID PANEL
Cholesterol: 113 mg/dL (ref 0–200)
HDL: 35.9 mg/dL — ABNORMAL LOW (ref >39.00)
LDL Cholesterol: 54 mg/dL (ref 0–99)
NonHDL: 77.07
Total CHOL/HDL Ratio: 3
Triglycerides: 113 mg/dL (ref 0.0–149.0)
VLDL: 22.6 mg/dL (ref 0.0–40.0)

## 2018-11-04 LAB — URIC ACID: Uric Acid, Serum: 5.6 mg/dL (ref 4.0–7.8)

## 2018-11-04 LAB — HEMOGLOBIN A1C: Hgb A1c MFr Bld: 6.1 % (ref 4.6–6.5)

## 2018-11-26 DIAGNOSIS — H903 Sensorineural hearing loss, bilateral: Secondary | ICD-10-CM | POA: Diagnosis not present

## 2018-11-26 DIAGNOSIS — H9319 Tinnitus, unspecified ear: Secondary | ICD-10-CM | POA: Diagnosis not present

## 2018-11-26 DIAGNOSIS — H6980 Other specified disorders of Eustachian tube, unspecified ear: Secondary | ICD-10-CM | POA: Diagnosis not present

## 2018-11-26 DIAGNOSIS — J301 Allergic rhinitis due to pollen: Secondary | ICD-10-CM | POA: Diagnosis not present

## 2018-12-12 ENCOUNTER — Encounter: Payer: Self-pay | Admitting: Family Medicine

## 2018-12-12 DIAGNOSIS — H903 Sensorineural hearing loss, bilateral: Secondary | ICD-10-CM | POA: Insufficient documentation

## 2018-12-22 DIAGNOSIS — L570 Actinic keratosis: Secondary | ICD-10-CM | POA: Diagnosis not present

## 2018-12-29 DIAGNOSIS — L247 Irritant contact dermatitis due to plants, except food: Secondary | ICD-10-CM | POA: Diagnosis not present

## 2018-12-29 DIAGNOSIS — B356 Tinea cruris: Secondary | ICD-10-CM | POA: Diagnosis not present

## 2018-12-29 DIAGNOSIS — E1169 Type 2 diabetes mellitus with other specified complication: Secondary | ICD-10-CM | POA: Insufficient documentation

## 2019-01-01 DIAGNOSIS — L259 Unspecified contact dermatitis, unspecified cause: Secondary | ICD-10-CM | POA: Diagnosis not present

## 2019-01-04 ENCOUNTER — Other Ambulatory Visit: Payer: Self-pay

## 2019-01-04 MED ORDER — ATORVASTATIN CALCIUM 20 MG PO TABS: 20.0000 mg | ORAL_TABLET | Freq: Every day | ORAL | 0 refills | Status: DC

## 2019-01-17 DIAGNOSIS — Z20828 Contact with and (suspected) exposure to other viral communicable diseases: Secondary | ICD-10-CM | POA: Diagnosis not present

## 2019-01-27 DIAGNOSIS — L570 Actinic keratosis: Secondary | ICD-10-CM | POA: Diagnosis not present

## 2019-02-25 ENCOUNTER — Other Ambulatory Visit: Payer: Self-pay

## 2019-02-25 ENCOUNTER — Ambulatory Visit: Payer: Federal, State, Local not specified - PPO

## 2019-02-25 ENCOUNTER — Ambulatory Visit (INDEPENDENT_AMBULATORY_CARE_PROVIDER_SITE_OTHER): Payer: Federal, State, Local not specified - PPO

## 2019-02-25 DIAGNOSIS — Z23 Encounter for immunization: Secondary | ICD-10-CM | POA: Diagnosis not present

## 2019-02-25 NOTE — Progress Notes (Signed)
Per orders of [name], injection of shingrix given by Randall An. Patient tolerated injection well.

## 2019-03-03 DIAGNOSIS — L570 Actinic keratosis: Secondary | ICD-10-CM | POA: Diagnosis not present

## 2019-03-03 DIAGNOSIS — L578 Other skin changes due to chronic exposure to nonionizing radiation: Secondary | ICD-10-CM | POA: Diagnosis not present

## 2019-06-01 ENCOUNTER — Other Ambulatory Visit: Payer: Self-pay | Admitting: Cardiovascular Disease

## 2019-06-01 NOTE — Telephone Encounter (Signed)
Unable to verify pharmacy of choice. Will verify during tomorrow's virtual visit.

## 2019-06-01 NOTE — Telephone Encounter (Signed)
Scheduled virtual with walked tomorrow. Patient is not local at this time so the pharmacy may not be accurate at this time

## 2019-06-01 NOTE — Telephone Encounter (Signed)
Please schedule overdue F/U appointment with Dr. Arida. Thank you! ?

## 2019-06-02 ENCOUNTER — Encounter: Payer: Self-pay | Admitting: Family

## 2019-06-02 ENCOUNTER — Telehealth (INDEPENDENT_AMBULATORY_CARE_PROVIDER_SITE_OTHER): Payer: Federal, State, Local not specified - PPO | Admitting: Family

## 2019-06-02 VITALS — BP 163/93 | HR 66 | Ht 70.5 in

## 2019-06-02 DIAGNOSIS — E782 Mixed hyperlipidemia: Secondary | ICD-10-CM

## 2019-06-02 DIAGNOSIS — R0600 Dyspnea, unspecified: Secondary | ICD-10-CM

## 2019-06-02 DIAGNOSIS — R030 Elevated blood-pressure reading, without diagnosis of hypertension: Secondary | ICD-10-CM | POA: Diagnosis not present

## 2019-06-02 DIAGNOSIS — R0609 Other forms of dyspnea: Secondary | ICD-10-CM

## 2019-06-02 NOTE — Progress Notes (Signed)
Virtual Visit via Telephone Note   This visit type was conducted due to national recommendations for restrictions regarding the COVID-19 Pandemic (e.g. social distancing) in an effort to limit this patient's exposure and mitigate transmission in our community.  Due to his co-morbid illnesses, this patient is at least at moderate risk for complications without adequate follow up.  This format is felt to be most appropriate for this patient at this time.  The patient did not have access to video technology/had technical difficulties with video requiring transitioning to audio format only (telephone).  All issues noted in this document were discussed and addressed.  No physical exam could be performed with this format.  Please refer to the patient's chart for his  consent to telehealth for [facility].   The patient was identified using 2 identifiers.  Date:  06/02/2019   ID:  Branigan Scholes, DOB December 27, 1955, MRN [id number]  Patient Location: Home Provider Location: Office  PCP:  [name], MD  Cardiologist:  Kathlyn Sacramento, MD  Electrophysiologist:  None   Evaluation Performed:  Follow-Up Visit  Chief Complaint:  Dyspnea on exertion  History of Present Illness:    Richard Case is a 64 y.o. male with exertional dyspnea, palpitations, DM2, mild HLD, sleep apnea not on CPAP, obesity. Last seen 09/2017 by Dr. Fletcher Anon. Underwent treadmill nuclear stress test 2019 with no evidence of ischemia nad normal LVEF. Echo at that time with LVEF 50-55% and gr1DD.   Since February he has been in the [place] area providing care for his 74 year old father-in-law.  Tells me his father-in-law is making marking improvements but does require significant help getting out of bed, ambulation, etc.  Reports no shortness of breath at rest.  Tells me his dyspnea on exertion has improved dramatically with having more physical activity Reports no chest pain, pressure, or tightness. No edema, orthopnea, PND.  Reports no palpitations.   He helps care for his father-in-law which requires physical exertion he also works in the garden and walking around the property [place] very [place].  Is able to walk fairly steep steps into the home with only mild difficulty.  Does not check BP routinely at home.  Checked today for the first time as he did not realize they had a blood pressure cuff.  He is agreeable to monitor as reading was elevated today.  The patient does not have symptoms concerning for COVID-19 infection (fever, chills, cough, or new shortness of breath).    Past Medical History:  Diagnosis Date  . ADD (attention deficit disorder) 2011   no records of workup; strattera caused urinary retention, not interested in habit forming medication  . Atrial ectopy 2011   improved with CPAP, documented by holter  . Benign colon polyp ?2014   hyperplastic  . BPH (benign prostatic hypertrophy)    per prior PCP records  . Diabetes mellitus without complication ([place])   . Elevated blood pressure (not hypertension)   . Erectile dysfunction   . GERD (gastroesophageal reflux disease)   . Gout   . Heart murmur longstanding  . History of kidney stones 2008, 2017  . History of pneumonia 2009  . HLD (hyperlipidemia)   . Kidney stones    HX  . Male erectile dysfunction   . Obesity, Class I, BMI 30-34.9   . OSA (obstructive sleep apnea) 2010   OSA with stabilization at CPAP 7cm  . Peyronie disease    per prior pcp records  . Polycythemia    ?  OSA related  . Prediabetes   . Rosacea    per prior pcp records  . Seasonal allergic rhinitis   . 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 CARDIOVASCULAR STRESS TEST  07/2017   low risk stress test, hypertensive response ([place])  . COLONOSCOPY  2009   rec rpt 5 yrs per prior PCP records but no actual report  . COLONOSCOPY  03/2012   mild diverticulosis, rpt 5 yrs (Dr Hulan Saas in [place])    . COLONOSCOPY WITH PROPOFOL N/A 12/18/2017   TAx2, HP, diverticulosis, rpt 5 yrs Vicente Males, Bailey Mech, MD)  . EXTRACORPOREAL SHOCK WAVE LITHOTRIPSY Right 04/06/2015   Procedure: EXTRACORPOREAL SHOCK WAVE LITHOTRIPSY (ESWL);  Surgeon: Nickie Retort, MD;  Location: [facility] ORS;  Service: Urology;  Laterality: Right;  . MOHS SURGERY  2015   SCC of nose  . TONSILLECTOMY       Current Meds  Medication Sig  . allopurinol (ZYLOPRIM) 300 MG tablet Take 1 tablet (300 mg total) by mouth daily.  Marland Kitchen aspirin 81 MG chewable tablet Chew 1 tablet (81 mg total) by mouth daily.  Marland Kitchen atorvastatin (LIPITOR) 20 MG tablet TAKE 1 TABLET BY MOUTH EVERY DAY  . ibuprofen (ADVIL,MOTRIN) 200 MG tablet Take 400 mg by mouth every 6 (six) hours as needed for moderate pain.  Marland Kitchen ketoconazole (NIZORAL) 2 % cream daily as needed.   . metFORMIN (GLUCOPHAGE) 500 MG tablet Take 1 tablet (500 mg total) by mouth daily with breakfast.  . scopolamine (TRANSDERM-SCOP, 1.5 MG,) 1 MG/3DAYS Place 1 patch (1.5 mg total) onto the skin every 3 (three) days. (Patient taking differently: Place 1 patch onto the skin every 3 (three) days. Taking PRN)  . tamsulosin (FLOMAX) 0.4 MG CAPS capsule TAKE 1 CAPSULE BY MOUTH EVERY DAY     Allergies:   Strattera [atomoxetine hcl]   Social History   Tobacco Use  . Smoking status: Never Smoker  . Smokeless tobacco: Never Used  Substance Use Topics  . Alcohol use: Yes    Alcohol/week: 0.0 standard drinks    Comment: none in months  . Drug use: No    Family Hx: The patient's family history includes CAD in his father; Cancer in his mother; Cancer (age of onset: 62) in his father; Heart Problems in his father; Lung disease in his mother; Prostate cancer in his father. There is no history of Stroke, Diabetes, Kidney disease, Kidney cancer, or Bladder Cancer.  ROS:   Please see the history of present illness.    Review of Systems  Constitution: Negative for chills, fever and malaise/fatigue.   Cardiovascular: Positive for dyspnea on exertion. Negative for chest pain, leg swelling, near-syncope, orthopnea, palpitations and syncope.  Respiratory: Negative for cough, shortness of breath and wheezing.   Gastrointestinal: Negative for nausea and vomiting.  Neurological: Negative for dizziness, light-headedness and weakness.   All other systems reviewed and are negative.  Prior CV studies:   The following studies were reviewed today:  Echo 07/2017 Left ventricle: The cavity size was normal. Wall thickness was    increased in a pattern of mild LVH. Systolic function was low    normal to mildly reduced. The estimated ejection fraction was in    the range of 50% to 55%. Doppler parameters are consistent with    abnormal left ventricular relaxation (grade 1 diastolic    dysfunction).  - Right ventricle: The cavity size was normal.  Wall thickness was    normal. Systolic function was normal.   Labs/Other Tests and Data Reviewed:    EKG:  No ECG reviewed.  Recent Labs: 11/03/2018: ALT 28; BUN 16; Creatinine, Ser 1.24; Hemoglobin 16.1; Platelets 203.0; Potassium 4.2; Sodium 139   Recent Lipid Panel Lab Results  Component Value Date/Time   CHOL 113 11/03/2018 03:57 PM   CHOL 187 05/04/2013 12:00 AM   TRIG 113.0 11/03/2018 03:57 PM   TRIG 126 05/04/2013 12:00 AM   HDL 35.90 (L) 11/03/2018 03:57 PM   CHOLHDL 3 11/03/2018 03:57 PM   LDLCALC 54 11/03/2018 03:57 PM   LDLCALC 123 05/04/2013 12:00 AM   LDLDIRECT 118.0 03/22/2015 07:45 AM   Wt Readings from Last 3 Encounters:  11/03/18 246 lb 5 oz (111.7 kg)  10/21/18 247 lb (112 kg)  12/18/17 230 lb (104.3 kg)    Objective:    Vital Signs:  BP (!) 163/93 (BP Location: Left Arm, Patient Position: Sitting, Cuff Size: Normal)   Pulse 66   Ht 5' 10.5" (1.791 m)   BMI 34.84 kg/m    VITAL SIGNS:  reviewed  ASSESSMENT & PLAN:    1. Exertional dyspnea - Reports symptoms have improved since being more active caring for his  father-in-law. No chest pain, pressure, tightness.  Encouraged to continue physical activity.  Tells me his father-in-law is going to be going to be referred to an monitored gym that sounds similar to an outpatient rehab program.  We discussed that if he is interested in participating we would be happy to send referral for exercise program.   2. HLD - Lipid panel 10/2018 with LDL 77. Continue Atorvastatin 80mg  daily. Refill provided.   3. Palpitations - Reports no recurrent palpitations.   4. Elevated BP without diagnosis of HTN -BP elevated today 163/93 on home cuff.  This is his first time using home cuff.  Previous BPs all in the 120s.  He is agreeable to monitor blood pressure a few times per week and keep a log.  Educated to report blood pressure consistently greater than 130/80.  Of note, no capability for EKG due to phone visit. No symptoms suggestive of angina, but will ask his PCP to get an EKG at their next clinic visit for routine monitoring.   Time:   Today, I have spent 11 minutes with the patient with telehealth technology discussing the above problems.    Medication Adjustments/Labs and Tests Ordered: Current medicines are reviewed at length with the patient today.  Concerns regarding medicines are outlined above.   Tests Ordered: No testing ordered today.   Medication Changes: No medication changes today.   Follow Up: Follow-up via MyChart message in 3 weeks regarding blood pressure.  Follow up  Either In Person or Virtual in 1 year(s)  Signed, Loel Dubonnet, NP  06/02/2019 9:42 AM    [place]

## 2019-06-02 NOTE — Patient Instructions (Signed)
Medication Instructions:  No medication changes today. Atorvastatin was sent to your pharmacy in [place].  *If you need a refill on your cardiac medications before your next appointment, please call your pharmacy*   Lab Work: No lab work ordered today. Your cholesterol panel from October looked good! Anticipate [name] will recheck in the fall at your annual visit.   Testing/Procedures: No testing ordered today. We will ask [name] if he will get an EKG the next time he sees you in the office, just to have an updated EKG since your last was in 2019.    Follow-Up: At [facility], you and your health needs are our priority.  As part of our continuing mission to provide you with exceptional heart care, we have created designated Provider Care Teams.  These Care Teams include your primary Cardiologist (physician) and Advanced Practice Providers (APPs -  Physician Assistants and Nurse Practitioners) who all work together to provide you with the care you need, when you need it.  We recommend signing up for the patient portal called "MyChart".  Sign up information is provided on this After Visit Summary.  MyChart is used to connect with patients for Virtual Visits (Telemedicine).  Patients are able to view lab/test results, encounter notes, upcoming appointments, etc.  Non-urgent messages can be sent to your provider as well.   To learn more about what you can do with MyChart, go to [email or web address].    Your next appointment:   1 year(s)  The format for your next appointment:   Either In Person or Virtual  Provider:   You may see Kathlyn Sacramento, MD or one of the following Advanced Practice Providers on your designated Care Team:    Murray Hodgkins, NP  Christell Faith, PA-C  Marrianne Mood, PA-C  Other Instructions  Check your blood pressure 3 times per week and keep a log.  Instructions for checking her blood pressure are included below.  Our goal is for your blood  pressure to be routinely less than 130/80.  If you find your blood pressure readings are consistently more than this, please let us know and we may consider starting a medication for high blood pressure.    Recommend low-sodium, heart healthy diet.  Recommend you continue your regular cardiovascular activity.  If you need a referral to the outpatient exercise program that your father-in-law is participating in, please let us know and we will be happy to send your most recent office note or any form that they need.  Tips to Measure your Blood Pressure Correctly  To determine whether you have hypertension, a medical professional will take a blood pressure reading. How you prepare for the test, the position of your arm, and other factors can change a blood pressure reading by 10% or more. That could be enough to hide high blood pressure, start you on a drug you don't really need, or lead your doctor to incorrectly adjust your medications.  National and international guidelines offer specific instructions for measuring blood pressure. If a doctor, nurse, or medical assistant isn't doing it right, don't hesitate to ask him or her to get with the guidelines.  Here's what you can do to ensure a correct reading: . Don't drink a caffeinated beverage or smoke during the 30 minutes before the test. . Sit quietly for five minutes before the test begins. . During the measurement, sit in a chair with your feet on the floor and your arm supported so your elbow is at  about heart level. . The inflatable part of the cuff should completely cover at least 80% of your upper arm, and the cuff should be placed on bare skin, not over a shirt. . Don't talk during the measurement. . Have your blood pressure measured twice, with a brief break in between. If the readings are different by 5 points or more, have it done a third time.  There are times to break these rules. If you sometimes feel lightheaded when getting out of  bed in the morning or when you stand after sitting, you should have your blood pressure checked while seated and then while standing to see if it falls from one position to the next.  Because blood pressure varies throughout the day, your doctor will rarely diagnose hypertension on the basis of a single reading. Instead, he or she will want to confirm the measurements on at least two occasions, usually within a few weeks of one another. The exception to this rule is if you have a blood pressure reading of 180/110 mm Hg or higher. A result this high usually calls for prompt treatment.  In 2017, new guidelines from the [employer], the [employer] of Cardiology, and nine other health organizations lowered the diagnosis of high blood pressure to 130/80 mm Hg or higher for all adults. The guidelines also redefined the various blood pressure categories to now include normal, elevated, Stage 1 hypertension, Stage 2 hypertension, and hypertensive crisis (see "Blood pressure categories").  Blood pressure categories  Blood pressure category SYSTOLIC (upper number)  DIASTOLIC (lower number)  Normal Less than 120 mm Hg and Less than 80 mm Hg  Elevated 120-129 mm Hg and Less than 80 mm Hg  High blood pressure: Stage 1 hypertension 130-139 mm Hg or 80-89 mm Hg  High blood pressure: Stage 2 hypertension 140 mm Hg or higher or 90 mm Hg or higher  Hypertensive crisis (consult your doctor immediately) Higher than 180 mm Hg and/or Higher than 120 mm Hg  Source: American Heart Association and American Stroke Association. For more on getting your blood pressure under control, buy Controlling Your Blood Pressure, a Special Health Report from [facility].   Blood Pressure Log   Date   Time  Blood Pressure  Position  Example: Nov 1 9 AM 124/78 sitting

## 2019-06-23 ENCOUNTER — Encounter: Payer: Self-pay | Admitting: Family

## 2019-09-06 ENCOUNTER — Other Ambulatory Visit: Payer: Self-pay | Admitting: Radiology

## 2019-09-06 DIAGNOSIS — N281 Cyst of kidney, acquired: Secondary | ICD-10-CM

## 2019-10-20 ENCOUNTER — Other Ambulatory Visit: Payer: Federal, State, Local not specified - PPO

## 2019-10-27 ENCOUNTER — Ambulatory Visit (INDEPENDENT_AMBULATORY_CARE_PROVIDER_SITE_OTHER): Payer: Medicare Other | Admitting: Urology

## 2019-10-27 ENCOUNTER — Ambulatory Visit: Payer: Federal, State, Local not specified - PPO | Admitting: Urology

## 2019-10-27 ENCOUNTER — Encounter: Payer: Self-pay | Admitting: Urology

## 2019-10-27 ENCOUNTER — Ambulatory Visit: Payer: Federal, State, Local not specified - PPO

## 2019-10-27 ENCOUNTER — Other Ambulatory Visit: Payer: Self-pay

## 2019-10-27 ENCOUNTER — Ambulatory Visit
Admission: RE | Admit: 2019-10-27 | Discharge: 2019-10-27 | Disposition: A | Discharge status: Home / Self Care | Payer: Medicare Other | Source: Ambulatory Visit | Attending: Urology | Admitting: Urology

## 2019-10-27 VITALS — BP 117/75 | HR 105 | Temp 98.0°F | Wt 233.1 lb

## 2019-10-27 DIAGNOSIS — N401 Enlarged prostate with lower urinary tract symptoms: Secondary | ICD-10-CM

## 2019-10-27 DIAGNOSIS — R3912 Poor urinary stream: Secondary | ICD-10-CM

## 2019-10-27 DIAGNOSIS — N281 Cyst of kidney, acquired: Secondary | ICD-10-CM

## 2019-10-27 DIAGNOSIS — N138 Other obstructive and reflux uropathy: Secondary | ICD-10-CM | POA: Diagnosis not present

## 2019-10-27 MED ORDER — TAMSULOSIN HCL 0.4 MG PO CAPS: ORAL_CAPSULE | ORAL | 4 refills | Status: DC

## 2019-10-27 NOTE — Patient Instructions (Signed)
We will see you back in one year.

## 2019-10-27 NOTE — Progress Notes (Signed)
10:31 AM   Richard Case 08-22-1955 [id number]  Referring provider: [name], MD [address] [place],  [place] [address]  Chief Complaint  Patient presents with  . Follow-up    HPI: Patient is a 64 year old male with a history nephrolithiasis, complex renal cyst and BPH with LU TS who presents today to for one year follow up.      History of nephrolithiasis  CT scan noted a right UVJ (5 mm x 4 mm x 4 mm) stone causing moderate right hydronephrosis and moderate right hydroureter.  Patient underwent right ESWL for definitive treatment of his UVJ stone.  Patient's stone analysis revealed a stone composition of 93% calcium oxalate monohydrate, 5% calcium oxalate dihydrate and 2% calcium phosphate carbonate.  His RUS on 10/21/2018 did not demonstrate any renal stones or hydronephrosis.   No renal calculi is seen on recent ultrasound dated October 27, 2019.  He denies any flank pain or passage of stone fragments.  Complex renal cyst Contrast CT scan 08/2015 noted small benign left renal cysts and probable tiny subcentimeter cyst in the right kidney which are too small to characterize. No definite evidence of a renal neoplasm. He also had a tiny nonobstructive left renal calculus without evidence of hydronephrosis. Hepatic stenosis. Cholelithiasis. Colonic diverticulosis and aortic atherosclerosis.  RUS performed on 08/29/2016 noted small renal cysts in each kidney. A septation is seen in the small right renal cyst. Extrarenal pelvis on the right. Study otherwise unremarkable.  RUS in 08/2017 revealed small simple and minimally complex renal cysts without appreciable interval change. No suspicious renal masses.  Normal bladder.  RUS on 10/20/2018 demonstrated bilateral kidney cysts. Minimal complicated cyst in the right kidney is unchanged.  RUS 10/27/2019 stable cysts.   BPH WITH LUTS  (prostate and/or bladder) IPSS score: 11/3     PVR:   8 mL    Previous score: 15/2  Previous  PVR: 93 mL  Major complaint(s):  Weak urinary stream.     Patient denies any modifying or aggravating factors.  Patient denies any gross hematuria, dysuria or suprapubic/flank pain.  Patient denies any fevers, chills, nausea or vomiting.   Currently taking: tamsulosin 0.4 mg daily   His father had prostate cancer, but more likely died from lung cancer.     IPSS    Row Name 10/27/19 1500         International Prostate Symptom Score   How often have you had the sensation of not emptying your bladder? Less than 1 in 5     How often have you had to urinate less than every two hours? Less than half the time     How often have you found you stopped and started again several times when you urinated? Less than 1 in 5 times     How often have you found it difficult to postpone urination? Less than 1 in 5 times     How often have you had a weak urinary stream? About half the time     How often have you had to strain to start urination? Less than half the time     How many times did you typically get up at night to urinate? 1 Time     Total IPSS Score 11       Quality of Life due to urinary symptoms   If you were to spend the rest of your life with your urinary condition just the way it is now  how would you feel about that? Mixed            Score:  1-7 Mild 8-19 Moderate 20-35 Severe  PMH: Past Medical History:  Diagnosis Date  . ADD (attention deficit disorder) 2011   no records of workup; strattera caused urinary retention, not interested in habit forming medication  . Atrial ectopy 2011   improved with CPAP, documented by holter  . Benign colon polyp ?2014   hyperplastic  . BPH (benign prostatic hypertrophy)    per prior PCP records  . Diabetes mellitus without complication ([place])   . Elevated blood pressure (not hypertension)   . Erectile dysfunction   . GERD (gastroesophageal reflux disease)   . Gout   . Heart murmur longstanding  . History of kidney stones 2008, 2017   . History of pneumonia 2009  . HLD (hyperlipidemia)   . Kidney stones    HX  . Male erectile dysfunction   . Obesity, Class I, BMI 30-34.9   . OSA (obstructive sleep apnea) 2010   OSA with stabilization at CPAP 7cm  . Peyronie disease    per prior pcp records  . Polycythemia    ?OSA related  . Prediabetes   . Rosacea    per prior pcp records  . Seasonal allergic rhinitis   . 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 CARDIOVASCULAR STRESS TEST  07/2017   low risk stress test, hypertensive response ([place])  . COLONOSCOPY  2009   rec rpt 5 yrs per prior PCP records but no actual report  . COLONOSCOPY  03/2012   mild diverticulosis, rpt 5 yrs (Dr Hulan Saas in [place])  . COLONOSCOPY WITH PROPOFOL N/A 12/18/2017   TAx2, HP, diverticulosis, rpt 5 yrs Vicente Males, Bailey Mech, MD)  . EXTRACORPOREAL SHOCK WAVE LITHOTRIPSY Right 04/06/2015   Procedure: EXTRACORPOREAL SHOCK WAVE LITHOTRIPSY (ESWL);  Surgeon: Nickie Retort, MD;  Location: [facility] ORS;  Service: Urology;  Laterality: Right;  . MOHS SURGERY  2015   SCC of nose  . TONSILLECTOMY      Home Medications:  Allergies as of 10/27/2019      Reactions   Strattera [atomoxetine Hcl] Other (See Comments)   Urinary retention      Medication List       Accurate as of October 27, 2019 11:59 PM. If you have any questions, ask your nurse or doctor.        allopurinol 300 MG tablet Commonly known as: ZYLOPRIM Take 1 tablet (300 mg total) by mouth daily.   aspirin 81 MG chewable tablet Chew 1 tablet (81 mg total) by mouth daily.   atorvastatin 20 MG tablet Commonly known as: LIPITOR TAKE 1 TABLET BY MOUTH EVERY DAY   ibuprofen 200 MG tablet Commonly known as: ADVIL Take 400 mg by mouth every 6 (six) hours as needed for moderate pain.   ketoconazole 2 % cream Commonly known as: NIZORAL daily as needed.   metFORMIN 500 MG tablet Commonly  known as: GLUCOPHAGE Take 1 tablet (500 mg total) by mouth daily with breakfast.   scopolamine 1 MG/3DAYS Commonly known as: Transderm-Scop (1.5 MG) Place 1 patch (1.5 mg total) onto the skin every 3 (three) days. What changed: additional instructions   tamsulosin 0.4 MG Caps capsule Commonly known as: FLOMAX TAKE 1 CAPSULE BY MOUTH EVERY DAY   triamcinolone cream 0.1 % Commonly known as: KENALOG As needed  Allergies:  Allergies  Allergen Reactions  . Strattera [Atomoxetine Hcl] Other (See Comments)    Urinary retention    Family History: Family History  Problem Relation Age of Onset  . Cancer Father 14       prostate  . CAD Father        possibly?  . Prostate cancer Father   . Heart Problems Father   . Cancer Mother        lung  . Lung disease Mother   . Stroke Neg Hx   . Diabetes Neg Hx   . Kidney disease Neg Hx   . Kidney cancer Neg Hx   . Bladder Cancer Neg Hx     Social History:  reports that he has never smoked. He has never used smokeless tobacco. He reports current alcohol use. He reports that he does not use drugs.  ROS: For pertinent review of systems please refer to history of present illness  Physical Exam: BP 117/75   Pulse (!) 105   Temp 98 F (36.7 C)   Wt 233 lb 1.6 oz (105.7 kg)   BMI 32.97 kg/m   Constitutional:  Well nourished. Alert and oriented, No acute distress. HEENT: [place] AT, mask in place.  Trachea midline Cardiovascular: No clubbing, cyanosis, or edema. Respiratory: Normal respiratory effort, no increased work of breathing. GU: No CVA tenderness.  No bladder fullness or masses.  Patient with circumcised phallus.  Urethral meatus is patent.  No penile discharge. No penile lesions or rashes. Scrotum without lesions, cysts, rashes and/or edema.  Testicles are located scrotally bilaterally. No masses are appreciated in the testicles. Left and right epididymis are normal. Rectal: Patient with  normal sphincter tone. Anus and  perineum without scarring or rashes. No rectal masses are appreciated. Prostate is approximately 55 grams, no nodules are appreciated. Seminal vesicles could not be palpated Neurologic: Grossly intact, no focal deficits, moving all 4 extremities. Psychiatric: Normal mood and affect.  Laboratory Data: Lab Results  Component Value Date   WBC 9.2 11/03/2018   HGB 16.1 11/03/2018   HCT 47.2 11/03/2018   MCV 93.3 11/03/2018   PLT 203.0 11/03/2018   Lab Results  Component Value Date   CREATININE 1.24 11/03/2018   Lab Results  Component Value Date   PSA 1.04 04/01/2016   PSA 1.16 03/22/2015   PSA 1.010 05/04/2013   Lab Results  Component Value Date   HGBA1C 6.1 11/03/2018   Results for orders placed or performed in visit on 10/27/19  Microscopic Examination   Urine  Result Value Ref Range   WBC, UA 0-5 0 - 5 /hpf   RBC None seen 0 - 2 /hpf   Epithelial Cells (non renal) None seen 0 - 10 /hpf   Casts Present (A) None seen /lpf   Cast Type Hyaline casts N/A   Bacteria, UA None seen None seen/Few  PSA  Result Value Ref Range   Prostate Specific Ag, Serum 1.4 0.0 - 4.0 ng/mL  Urinalysis, Complete  Result Value Ref Range   Specific Gravity, UA 1.025 1.005 - 1.030   pH, UA 6.5 5.0 - 7.5   Color, UA Yellow Yellow   Appearance Ur Hazy (A) Clear   Leukocytes,UA Negative Negative   Protein,UA Negative Negative/Trace   Glucose, UA Negative Negative   Ketones, UA Negative Negative   RBC, UA Negative Negative   Bilirubin, UA Negative Negative   Urobilinogen, Ur 0.2 0.2 - 1.0 mg/dL   Nitrite, UA Negative Negative  Microscopic Examination See below:    Component     Latest Ref Rng & Units 10/27/2019  Specific Gravity, UA     1.005 - 1.030 1.025  pH, UA     5.0 - 7.5 6.5  Color, UA     Yellow Yellow  Appearance Ur     Clear Hazy (A)  Leukocytes,UA     Negative Negative  Protein,UA     Negative/Trace Negative  Glucose, UA     Negative Negative  Ketones, UA      Negative Negative  RBC, UA     Negative Negative  Bilirubin, UA     Negative Negative  Urobilinogen, Ur     0.2 - 1.0 mg/dL 0.2  Nitrite, UA     Negative Negative  Microscopic Examination      See below:   Component     Latest Ref Rng & Units 10/27/2019  WBC, UA     0 - 5 /hpf 0-5  RBC     0 - 2 /hpf None seen  Epithelial Cells (non renal)     0 - 10 /hpf None seen  Casts     None seen /lpf Present (A)  Cast Type     N/A Hyaline casts  Bacteria, UA     None seen/Few None seen   I have reviewed the labs  Pertinent Imaging: CLINICAL DATA:  Bilateral renal cysts.  EXAM: RENAL / URINARY TRACT ULTRASOUND COMPLETE  COMPARISON:  October 20, 2018.  FINDINGS: Right Kidney:  Renal measurements: 11.7 x 6.7 x 5.3 cm = volume: 215 mL. Stable 9 mm probably septated cyst is seen in midpole. Echogenicity within normal limits. No mass or hydronephrosis visualized.  Left Kidney:  Renal measurements: 13.1 x 5.4 x 4.8 cm = volume: 178 mL. 2 cm simple cyst is seen in upper pole which is stable compared to prior exam. 2.1 cm simple cyst is seen in midpole which is unchanged compared to prior exam. Echogenicity within normal limits. No mass or hydronephrosis visualized.  Bladder:  Appears normal for degree of bladder distention.  Other:  None.  IMPRESSION: Stable bilateral renal cysts are noted. No other renal abnormality is noted.   Electronically Signed   By: Marijo Conception M.D.   On: 10/29/2019 09:17 I have independently reviewed the films.  See HPI.   Assessment & Plan:    1. History of nephrolithiasis With no recent complaints of flank pain or passage of fragments Recent R [place] did not identify any stones Patient to return next year with R [place] for surveillance on complex cyst  2. BPH with LUTS IPSS score is 11/3, it is slightly worse Continue conservative management, avoiding bladder irritants and timed voiding's Most bothersome symptoms is/are  weak stream Continue tamsulosin 0.4 mg daily - refills given RTC in 12 months for IPSS, PSA, PVR and exam   3. Peyronie's disease:  Patient is not interested in pursuing any treatment options at this time.    4. Minimally complex right renal cyst Cyst remains unchanged  RUS in one year for surveillance   Return in about 1 year (around 10/26/2020) for RUS, I PSS, SHIM, PSA and exam .  These notes generated with voice recognition software. I apologize for typographical errors.  Zara Council, PA-C  [facility] [address] [place], [place] [address] [phone]

## 2019-10-28 LAB — URINALYSIS, COMPLETE
Bilirubin, UA: NEGATIVE
Glucose, UA: NEGATIVE
Ketones, UA: NEGATIVE
Leukocytes,UA: NEGATIVE
Nitrite, UA: NEGATIVE
Protein,UA: NEGATIVE
RBC, UA: NEGATIVE
Specific Gravity, UA: 1.025 (ref 1.005–1.030)
Urobilinogen, Ur: 0.2 mg/dL (ref 0.2–1.0)
pH, UA: 6.5 (ref 5.0–7.5)

## 2019-10-28 LAB — MICROSCOPIC EXAMINATION
Bacteria, UA: NONE SEEN
Epithelial Cells (non renal): NONE SEEN /hpf (ref 0–10)
RBC, Urine: NONE SEEN /hpf (ref 0–2)

## 2019-10-28 LAB — PSA: Prostate Specific Ag, Serum: 1.4 ng/mL (ref 0.0–4.0)

## 2019-11-08 ENCOUNTER — Encounter: Payer: Federal, State, Local not specified - PPO | Admitting: Family Medicine

## 2019-11-11 ENCOUNTER — Other Ambulatory Visit: Payer: Self-pay | Admitting: Family Medicine

## 2019-11-24 ENCOUNTER — Encounter: Payer: Self-pay | Admitting: Family Medicine

## 2019-11-24 ENCOUNTER — Ambulatory Visit (INDEPENDENT_AMBULATORY_CARE_PROVIDER_SITE_OTHER): Payer: Medicare Other | Admitting: Family Medicine

## 2019-11-24 ENCOUNTER — Other Ambulatory Visit: Payer: Self-pay

## 2019-11-24 VITALS — BP 120/72 | HR 97 | Temp 97.9°F | Ht 69.5 in | Wt 234.3 lb

## 2019-11-24 DIAGNOSIS — M109 Gout, unspecified: Secondary | ICD-10-CM | POA: Diagnosis not present

## 2019-11-24 DIAGNOSIS — E119 Type 2 diabetes mellitus without complications: Secondary | ICD-10-CM | POA: Diagnosis not present

## 2019-11-24 DIAGNOSIS — N401 Enlarged prostate with lower urinary tract symptoms: Secondary | ICD-10-CM

## 2019-11-24 DIAGNOSIS — H9313 Tinnitus, bilateral: Secondary | ICD-10-CM

## 2019-11-24 DIAGNOSIS — E1169 Type 2 diabetes mellitus with other specified complication: Secondary | ICD-10-CM

## 2019-11-24 DIAGNOSIS — Z0000 Encounter for general adult medical examination without abnormal findings: Secondary | ICD-10-CM | POA: Insufficient documentation

## 2019-11-24 DIAGNOSIS — E785 Hyperlipidemia, unspecified: Secondary | ICD-10-CM

## 2019-11-24 DIAGNOSIS — N281 Cyst of kidney, acquired: Secondary | ICD-10-CM

## 2019-11-24 DIAGNOSIS — D751 Secondary polycythemia: Secondary | ICD-10-CM

## 2019-11-24 DIAGNOSIS — R221 Localized swelling, mass and lump, neck: Secondary | ICD-10-CM

## 2019-11-24 DIAGNOSIS — E669 Obesity, unspecified: Secondary | ICD-10-CM

## 2019-11-24 DIAGNOSIS — R3912 Poor urinary stream: Secondary | ICD-10-CM

## 2019-11-24 DIAGNOSIS — Z23 Encounter for immunization: Secondary | ICD-10-CM | POA: Diagnosis not present

## 2019-11-24 DIAGNOSIS — Z730 Burn-out: Secondary | ICD-10-CM | POA: Insufficient documentation

## 2019-11-24 DIAGNOSIS — G4733 Obstructive sleep apnea (adult) (pediatric): Secondary | ICD-10-CM

## 2019-11-24 MED ORDER — ALLOPURINOL 300 MG PO TABS
300.0000 mg | ORAL_TABLET | Freq: Every day | ORAL | 3 refills | Status: DC
Start: 2019-11-24 — End: 2021-02-16

## 2019-11-24 MED ORDER — ATORVASTATIN CALCIUM 20 MG PO TABS
20.0000 mg | ORAL_TABLET | Freq: Every day | ORAL | 3 refills | Status: DC
Start: 2019-11-24 — End: 2020-12-26

## 2019-11-24 MED ORDER — METFORMIN HCL 500 MG PO TABS
500.0000 mg | ORAL_TABLET | Freq: Every day | ORAL | 3 refills | Status: DC
Start: 2019-11-24 — End: 2020-12-26

## 2019-11-24 MED ORDER — TRIAMCINOLONE ACETONIDE 0.1 % EX CREA
TOPICAL_CREAM | CUTANEOUS | 0 refills | Status: DC | PRN
Start: 2019-11-24 — End: 2023-08-28

## 2019-11-24 NOTE — Progress Notes (Signed)
This visit was conducted in person.  BP 120/72 (BP Location: Left Arm, Patient Position: Sitting, Cuff Size: Large)   Pulse 97   Temp 97.9 F (36.6 C) (Temporal)   Ht 5' 9.5" (1.765 m)   Wt 234 lb 5 oz (106.3 kg)   SpO2 98%   BMI 34.11 kg/m    CC: welcome to medicare visit  Subjective:    Patient ID: Richard Case, male    DOB: 1955-11-08, 64 y.o.   MRN: [id number]  HPI: Richard Case is a 64 y.o. male presenting on 11/24/2019 for Welcome to Medicare Exam    Hearing Screening   125Hz  250Hz  500Hz  1000Hz  2000Hz  3000Hz  4000Hz  6000Hz  8000Hz   Right ear:   20 20 20   40    Left ear:   20 20 20  25       Visual Acuity Screening   Right eye Left eye Both eyes  Without correction: 20/20 20/20 20/15   With correction:         Office Visit from 11/24/2019 in [place] [facility] [place]  PHQ-2 Total Score 3      Fall Risk  11/24/2019 12/04/2015  Falls in the past year? 0 No    65 yo FIL living with them. FIL doing very well, but this has been stressful.  Lives in the [place], drove here today. He has been gardening.   Has been checking sugars at home through home meter provided by insurance (and A1c). Home fasting cbg's overall 110s.   Ongoing tinnitus affecting hearing - saw audiologist without benefit.   Preventative: COLONOSCOPY WITH PROPOFOL 12/18/2017 - TAx2, HP, diverticulosis, rpt 5 yrs Vicente Males, Bailey Mech, MD) Prostate cancer screening - sees urology yearly Lung cancer screening  Flu shot yearly  Pneumovax 2017, prevnar-13 today  COVID vaccine Moderna completed  Tdap 2017  Shingrix - 10/2018, 02/2019 Advanced directive planning - does not have this. Would want wife [name] to be HCPOA. Packet provided today.  Seat belt use discussed  Sunscreen use discussed. No changing moles on skin. Sees dermatology  Non smoker  Alcohol - rarely Dentist q6 mo  Eye exam - overdue  Bowel - no constipation Bladder - no incontinence  Lives with wife [name], 1 dog Occupation:  retired - Civil engineer, contracting for [employer]  Edu: BS Activity:walking dog Diet: good water, fruits/vegetables daily     Relevant past medical, surgical, family and social history reviewed and updated as indicated. Interim medical history since our last visit reviewed. Allergies and medications reviewed and updated. Outpatient Medications Prior to Visit  Medication Sig Dispense Refill  . aspirin 81 MG chewable tablet Chew 1 tablet (81 mg total) by mouth daily.    Marland Kitchen ibuprofen (ADVIL,MOTRIN) 200 MG tablet Take 400 mg by mouth every 6 (six) hours as needed for moderate pain.    Marland Kitchen scopolamine (TRANSDERM-SCOP, 1.5 MG,) 1 MG/3DAYS Place 1 patch (1.5 mg total) onto the skin every 3 (three) days. (Patient taking differently: Place 1 patch onto the skin every 3 (three) days. Taking PRN) 4 patch 0  . tamsulosin (FLOMAX) 0.4 MG CAPS capsule TAKE 1 CAPSULE BY MOUTH EVERY DAY 90 capsule 4  . allopurinol (ZYLOPRIM) 300 MG tablet TAKE 1 TABLET BY MOUTH EVERY DAY 90 tablet 0  . atorvastatin (LIPITOR) 20 MG tablet TAKE 1 TABLET BY MOUTH EVERY DAY 90 tablet 3  . metFORMIN (GLUCOPHAGE) 500 MG tablet TAKE 1 TABLET BY MOUTH EVERY DAY WITH BREAKFAST 90 tablet 0  . triamcinolone cream (KENALOG) 0.1 %  As needed    . ketoconazole (NIZORAL) 2 % cream daily as needed.      No facility-administered medications prior to visit.     Per HPI unless specifically indicated in ROS section below Review of Systems Objective:  BP 120/72 (BP Location: Left Arm, Patient Position: Sitting, Cuff Size: Large)   Pulse 97   Temp 97.9 F (36.6 C) (Temporal)   Ht 5' 9.5" (1.765 m)   Wt 234 lb 5 oz (106.3 kg)   SpO2 98%   BMI 34.11 kg/m   Wt Readings from Last 3 Encounters:  11/24/19 234 lb 5 oz (106.3 kg)  10/27/19 233 lb 1.6 oz (105.7 kg)  11/03/18 246 lb 5 oz (111.7 kg)      Physical Exam Vitals and nursing note reviewed.  Constitutional:      General: He is not in acute distress.    Appearance: Normal appearance.  He is well-developed. He is not ill-appearing.  HENT:     Head: Normocephalic and atraumatic.     Right Ear: Hearing, tympanic membrane, ear canal and external ear normal.     Left Ear: Hearing, tympanic membrane, ear canal and external ear normal.  Eyes:     General: No scleral icterus.    Extraocular Movements: Extraocular movements intact.     Conjunctiva/sclera: Conjunctivae normal.     Pupils: Pupils are equal, round, and reactive to light.  Neck:     Thyroid: No thyroid mass or thyromegaly.     Vascular: No carotid bruit.      Comments: Swelling to lateral neck along R posterior cervical chain Cardiovascular:     Rate and Rhythm: Normal rate and regular rhythm.     Pulses: Normal pulses.          Radial pulses are 2+ on the right side and 2+ on the left side.     Heart sounds: Normal heart sounds. No murmur heard.   Pulmonary:     Effort: Pulmonary effort is normal. No respiratory distress.     Breath sounds: Normal breath sounds. No wheezing, rhonchi or rales.  Abdominal:     General: Bowel sounds are normal. There is no distension.     Palpations: Abdomen is soft. There is no mass.     Tenderness: There is no abdominal tenderness. There is no guarding or rebound.     Hernia: No hernia is present.  Musculoskeletal:        General: Normal range of motion.     Cervical back: Normal range of motion and neck supple.     Right lower leg: No edema.     Left lower leg: No edema.  Skin:    General: Skin is warm and dry.     Findings: No rash.  Neurological:     General: No focal deficit present.     Mental Status: He is alert and oriented to person, place, and time.     Comments:  CN grossly intact, station and gait intact Recall 2/3, 3/3 with cue Calculation 3/5 DLORW  Psychiatric:        Mood and Affect: Mood normal.        Behavior: Behavior normal.        Thought Content: Thought content normal.        Judgment: Judgment normal.       Results for orders placed  or performed in visit on 11/24/19  Lipid panel  Result Value Ref Range   Cholesterol 123 0 -  200 mg/dL   Triglycerides 112.0 0 - 149 mg/dL   HDL 41.50 >39.00 mg/dL   VLDL 22.4 0.0 - 40.0 mg/dL   LDL Cholesterol 59 0 - 99 mg/dL   Total CHOL/HDL Ratio 3    NonHDL 81.66   Comprehensive metabolic panel  Result Value Ref Range   Sodium 141 135 - 145 mEq/L   Potassium 4.4 3.5 - 5.1 mEq/L   Chloride 102 96 - 112 mEq/L   CO2 30 19 - 32 mEq/L   Glucose, Bld 87 70 - 99 mg/dL   BUN 18 6 - 23 mg/dL   Creatinine, Ser 1.29 0.40 - 1.50 mg/dL   Total Bilirubin 1.3 (H) 0.2 - 1.2 mg/dL   Alkaline Phosphatase 76 39 - 117 U/L   AST 22 0 - 37 U/L   ALT 24 0 - 53 U/L   Total Protein 7.0 6.0 - 8.3 g/dL   Albumin 4.5 3.5 - 5.2 g/dL   GFR 58.22 (L) >60.00 mL/min   Calcium 9.7 8.4 - 10.5 mg/dL  Hemoglobin A1c  Result Value Ref Range   Hgb A1c MFr Bld 6.1 4.6 - 6.5 %  Microalbumin / creatinine urine ratio  Result Value Ref Range   Microalb, Ur 1.0 0.0 - 1.9 mg/dL   Creatinine,U 162.0 mg/dL   Microalb Creat Ratio 0.6 0.0 - 30.0 mg/g  Uric acid  Result Value Ref Range   Uric Acid, Serum 5.4 4.0 - 7.8 mg/dL  TSH  Result Value Ref Range   TSH 1.45 0.35 - 4.50 uIU/mL  CBC with Differential/Platelet  Result Value Ref Range   WBC 8.8 4.0 - 10.5 K/uL   RBC 5.43 4.22 - 5.81 Mil/uL   Hemoglobin 17.1 (H) 13.0 - 17.0 g/dL   HCT 51.0 39 - 52 %   MCV 93.9 78.0 - 100.0 fl   MCHC 33.5 30.0 - 36.0 g/dL   RDW 13.2 11.5 - 15.5 %   Platelets 231.0 150 - 400 K/uL   Neutrophils Relative % 62.8 43 - 77 %   Lymphocytes Relative 23.6 12 - 46 %   Monocytes Relative 11.3 3 - 12 %   Eosinophils Relative 1.8 0 - 5 %   Basophils Relative 0.5 0 - 3 %   Neutro Abs 5.5 1.4 - 7.7 K/uL   Lymphs Abs 2.1 0.7 - 4.0 K/uL   Monocytes Absolute 1.0 0.1 - 1.0 K/uL   Eosinophils Absolute 0.2 0.0 - 0.7 K/uL   Basophils Absolute 0.0 0.0 - 0.1 K/uL   Depression screen [facility] 2/9 11/24/2019 11/03/2018 10/29/2017 10/29/2017  12/04/2015  Decreased Interest 2 0 2 0 0  Down, Depressed, Hopeless 1 1 2  - 0  PHQ - 2 Score 3 1 4  0 0  Altered sleeping 2 - 2 - -  Tired, decreased energy 3 - 3 - -  Change in appetite 0 - 1 - -  Feeling bad or failure about yourself  0 - 1 - -  Trouble concentrating 0 - 0 - -  Moving slowly or fidgety/restless 0 - 0 - -  Suicidal thoughts 0 - 0 - -  PHQ-9 Score 8 - 11 - -    GAD 7 : Generalized Anxiety Score 11/24/2019 10/29/2017  Nervous, Anxious, on Edge 0 1  Control/stop worrying 0 1  Worry too much - different things 2 3  Trouble relaxing 0 0  Restless 0 0  Easily annoyed or irritable 1 0  Afraid - awful might happen 0 2  Total GAD 7 Score 3 7   EKG - NSR rate 75, normal axis, intervals, no acute ST/T changes.  Assessment & Plan:  This visit occurred during the SARS-CoV-2 public health emergency.  Safety protocols were in place, including screening questions prior to the visit, additional usage of staff PPE, and extensive cleaning of exam room while observing appropriate contact time as indicated for disinfecting solutions.   Problem List Items Addressed This Visit    Welcome to Medicare preventive visit - Primary    I have personally reviewed the Medicare Annual Wellness questionnaire and have noted 1. The patient's medical and social history 2. Their use of alcohol, tobacco or illicit drugs 3. Their current medications and supplements 4. The patient's functional ability including ADL's, fall risks, home safety risks and hearing or visual impairment. Cognitive function has been assessed and addressed as indicated.  5. Diet and physical activity 6. Evidence for depression or mood disorders The patients weight, height, BMI have been recorded in the chart. I have made referrals, counseling and provided education to the patient based on review of the above and I have provided the pt with a written personalized care plan for preventive services. Provider list updated.. See  scanned questionairre as needed for further documentation. Reviewed preventative protocols and updated unless pt declined.       Relevant Orders   EKG 12-Lead (Completed)   Tinnitus   Polycythemia    Check CBC - ?OSA related.       Relevant Orders   CBC with Differential/Platelet (Completed)   OSA (obstructive sleep apnea)    Not on CPAP - has had difficulty tolerating mask previously.       Obesity, Class I, BMI 30-34.9    12 lb weight loss noted over the past year. Encouraged healthy diet and lifestyle choices to affect sustainable weight loss.       Mass of right side of neck    New small mass to R posterolateral neck - ?LN vs lipoma.  Will order neck US - pt requests to be done closer to home. Ordered.      Relevant Orders   US SOFT TISSUE HEAD & NECK (NON-THYROID)   Hyperlipidemia associated with type 2 diabetes mellitus (HCC)    Chronic, stable. Continue atorvastatin. Update FLP.  The ASCVD Risk score Mikey Bussing DC Jr., et al., 2013) failed to calculate for the following reasons:   The valid total cholesterol range is 130 to 320 mg/dL       Relevant Medications   atorvastatin (LIPITOR) 20 MG tablet   metFORMIN (GLUCOPHAGE) 500 MG tablet   Other Relevant Orders   Lipid panel (Completed)   Comprehensive metabolic panel (Completed)   TSH (Completed)   Gout    Chronic, stable on allopurinol without recent gout flare.  Check uric acid.       Relevant Orders   Uric acid (Completed)   Controlled diabetes mellitus type II without complication (HCC)    Chronic, continues low dose metformin 500mg  daily. Update A1c.       Relevant Medications   atorvastatin (LIPITOR) 20 MG tablet   metFORMIN (GLUCOPHAGE) 500 MG tablet   Other Relevant Orders   Hemoglobin A1c (Completed)   Microalbumin / creatinine urine ratio (Completed)   Complex renal cyst    Followed by urology.       Burnout of caregiver    Discussed caregiver burnout, and importance of self care. Reviewed  healthy stress relieving strategies. Not interested in  medication at this time. Advised let us know if worsening symptoms.       BPH (benign prostatic hyperplasia)    Followed by urology - on flomax.        Other Visit Diagnoses    Need for vaccination with 13-polyvalent pneumococcal conjugate vaccine       Relevant Orders   Pneumococcal conjugate vaccine 13-valent IM (Completed)       Meds ordered this encounter  Medications  . allopurinol (ZYLOPRIM) 300 MG tablet    Sig: Take 1 tablet (300 mg total) by mouth daily.    Dispense:  90 tablet    Refill:  3  . atorvastatin (LIPITOR) 20 MG tablet    Sig: Take 1 tablet (20 mg total) by mouth daily.    Dispense:  90 tablet    Refill:  3  . metFORMIN (GLUCOPHAGE) 500 MG tablet    Sig: Take 1 tablet (500 mg total) by mouth daily with breakfast.    Dispense:  90 tablet    Refill:  3  . triamcinolone cream (KENALOG) 0.1 %    Sig: Apply topically as needed.    Dispense:  30 g    Refill:  0   Orders Placed This Encounter  Procedures  . US SOFT TISSUE HEAD & NECK (NON-THYROID)    Standing Status:   Future    Standing Expiration Date:   11/23/2020    Scheduling Instructions:     Pt requests to be done in [facility] in [place] Specific Question:   Reason for Exam (SYMPTOM  OR DIAGNOSIS REQUIRED)    Answer:   right neck swelling    Order Specific Question:   Preferred imaging location?    Answer:   External  . Pneumococcal conjugate vaccine 13-valent IM  . Lipid panel  . Comprehensive metabolic panel  . Hemoglobin A1c  . Microalbumin / creatinine urine ratio  . Uric acid  . TSH  . CBC with Differential/Platelet  . EKG 12-Lead    Patient instructions: [id number] today Labs today  Send [place] dates of Moderna to update your chart  Schedule appointment with eye doctor (overdue). Call insurance to find local provider.  Advanced directive packet provided today.  Possible swelling to right neck - I will order  neck ultrasound to further evaluate - ?lipoma vs swollen lymph node.  Return as needed or in 1 year for next wellness visit.   Follow up plan: Return in about 1 year (around 11/23/2020) for medicare wellness visit.  [name], MD

## 2019-11-24 NOTE — Patient Instructions (Addendum)
Prevnar-13 today Labs today  Send [place] dates of Levan Hurst to update your chart  Schedule appointment with eye doctor (overdue). Call insurance to find local provider.  Advanced directive packet provided today.  Possible swelling to right neck - I will order neck ultrasound to further evaluate - ?lipoma vs swollen lymph node.  Return as needed or in 1 year for next wellness visit.   Health Maintenance After Age 64 After age 88, you are at a higher risk for certain long-term diseases and infections as well as injuries from falls. Falls are a major cause of broken bones and head injuries in people who are older than age 57. Getting regular preventive care can help to keep you healthy and well. Preventive care includes getting regular testing and making lifestyle changes as recommended by your health care provider. Talk with your health care provider about:  Which screenings and tests you should have. A screening is a test that checks for a disease when you have no symptoms.  A diet and exercise plan that is right for you. What should I know about screenings and tests to prevent falls? Screening and testing are the best ways to find a health problem early. Early diagnosis and treatment give you the best chance of managing medical conditions that are common after age 60. Certain conditions and lifestyle choices may make you more likely to have a fall. Your health care provider may recommend:  Regular vision checks. Poor vision and conditions such as cataracts can make you more likely to have a fall. If you wear glasses, make sure to get your prescription updated if your vision changes.  Medicine review. Work with your health care provider to regularly review all of the medicines you are taking, including over-the-counter medicines. Ask your health care provider about any side effects that may make you more likely to have a fall. Tell your health care provider if any medicines that you take make you feel  dizzy or sleepy.  Osteoporosis screening. Osteoporosis is a condition that causes the bones to get weaker. This can make the bones weak and cause them to break more easily.  Blood pressure screening. Blood pressure changes and medicines to control blood pressure can make you feel dizzy.  Strength and balance checks. Your health care provider may recommend certain tests to check your strength and balance while standing, walking, or changing positions.  Foot health exam. Foot pain and numbness, as well as not wearing proper footwear, can make you more likely to have a fall.  Depression screening. You may be more likely to have a fall if you have a fear of falling, feel emotionally low, or feel unable to do activities that you used to do.  Alcohol use screening. Using too much alcohol can affect your balance and may make you more likely to have a fall. What actions can I take to lower my risk of falls? General instructions  Talk with your health care provider about your risks for falling. Tell your health care provider if: ? You fall. Be sure to tell your health care provider about all falls, even ones that seem minor. ? You feel dizzy, sleepy, or off-balance.  Take over-the-counter and prescription medicines only as told by your health care provider. These include any supplements.  Eat a healthy diet and maintain a healthy weight. A healthy diet includes low-fat dairy products, low-fat (lean) meats, and fiber from whole grains, beans, and lots of fruits and vegetables. Home safety  Remove  any tripping hazards, such as rugs, cords, and clutter.  Install safety equipment such as grab bars in bathrooms and safety rails on stairs.  Keep rooms and walkways well-lit. Activity   Follow a regular exercise program to stay fit. This will help you maintain your balance. Ask your health care provider what types of exercise are appropriate for you.  If you need a cane or walker, use it as  recommended by your health care provider.  Wear supportive shoes that have nonskid soles. Lifestyle  Do not drink alcohol if your health care provider tells you not to drink.  If you drink alcohol, limit how much you have: ? 0-1 drink a day for women. ? 0-2 drinks a day for men.  Be aware of how much alcohol is in your drink. In the [place] one drink equals one typical bottle of beer (12 oz), one-half glass of wine (5 oz), or one shot of hard liquor (1 oz).  Do not use any products that contain nicotine or tobacco, such as cigarettes and e-cigarettes. If you need help quitting, ask your health care provider. Summary  Having a healthy lifestyle and getting preventive care can help to protect your health and wellness after age 90.  Screening and testing are the best way to find a health problem early and help you avoid having a fall. Early diagnosis and treatment give you the best chance for managing medical conditions that are more common for people who are older than age 45.  Falls are a major cause of broken bones and head injuries in people who are older than age 12. Take precautions to prevent a fall at home.  Work with your health care provider to learn what changes you can make to improve your health and wellness and to prevent falls. This information is not intended to replace advice given to you by your health care provider. Make sure you discuss any questions you have with your health care provider. Document Revised: 04/23/2018 Document Reviewed: 11/13/2016 Elsevier Patient Education  2020 [employer].

## 2019-11-25 LAB — COMPREHENSIVE METABOLIC PANEL
ALT: 24 U/L (ref 0–53)
AST: 22 U/L (ref 0–37)
Albumin: 4.5 g/dL (ref 3.5–5.2)
Alkaline Phosphatase: 76 U/L (ref 39–117)
BUN: 18 mg/dL (ref 6–23)
CO2: 30 mEq/L (ref 19–32)
Calcium: 9.7 mg/dL (ref 8.4–10.5)
Chloride: 102 mEq/L (ref 96–112)
Creatinine, Ser: 1.29 mg/dL (ref 0.40–1.50)
GFR: 58.22 mL/min — ABNORMAL LOW (ref >60.00)
Glucose, Bld: 87 mg/dL (ref 70–99)
Potassium: 4.4 mEq/L (ref 3.5–5.1)
Sodium: 141 mEq/L (ref 135–145)
Total Bilirubin: 1.3 mg/dL — ABNORMAL HIGH (ref 0.2–1.2)
Total Protein: 7 g/dL (ref 6.0–8.3)

## 2019-11-25 LAB — CBC WITH DIFFERENTIAL/PLATELET
Basophils Absolute: 0 10*3/uL (ref 0.0–0.1)
Basophils Relative: 0.5 % (ref 0.0–3.0)
Eosinophils Absolute: 0.2 10*3/uL (ref 0.0–0.7)
Eosinophils Relative: 1.8 % (ref 0.0–5.0)
HCT: 51 % (ref 39.0–52.0)
Hemoglobin: 17.1 g/dL — ABNORMAL HIGH (ref 13.0–17.0)
Lymphocytes Relative: 23.6 % (ref 12.0–46.0)
Lymphs Abs: 2.1 10*3/uL (ref 0.7–4.0)
MCHC: 33.5 g/dL (ref 30.0–36.0)
MCV: 93.9 fl (ref 78.0–100.0)
Monocytes Absolute: 1 10*3/uL (ref 0.1–1.0)
Monocytes Relative: 11.3 % (ref 3.0–12.0)
Neutro Abs: 5.5 10*3/uL (ref 1.4–7.7)
Neutrophils Relative %: 62.8 % (ref 43.0–77.0)
Platelets: 231 10*3/uL (ref 150.0–400.0)
RBC: 5.43 Mil/uL (ref 4.22–5.81)
RDW: 13.2 % (ref 11.5–15.5)
WBC: 8.8 10*3/uL (ref 4.0–10.5)

## 2019-11-25 LAB — MICROALBUMIN / CREATININE URINE RATIO
Creatinine,U: 162 mg/dL
Microalb Creat Ratio: 0.6 mg/g (ref 0.0–30.0)
Microalb, Ur: 1 mg/dL (ref 0.0–1.9)

## 2019-11-25 LAB — TSH: TSH: 1.45 u[IU]/mL (ref 0.35–4.50)

## 2019-11-25 LAB — LIPID PANEL
Cholesterol: 123 mg/dL (ref 0–200)
HDL: 41.5 mg/dL (ref >39.00)
LDL Cholesterol: 59 mg/dL (ref 0–99)
NonHDL: 81.66
Total CHOL/HDL Ratio: 3
Triglycerides: 112 mg/dL (ref 0.0–149.0)
VLDL: 22.4 mg/dL (ref 0.0–40.0)

## 2019-11-25 LAB — HEMOGLOBIN A1C: Hgb A1c MFr Bld: 6.1 % (ref 4.6–6.5)

## 2019-11-25 LAB — URIC ACID: Uric Acid, Serum: 5.4 mg/dL (ref 4.0–7.8)

## 2019-11-25 NOTE — Assessment & Plan Note (Signed)
Not on CPAP - has had difficulty tolerating mask previously.

## 2019-11-25 NOTE — Assessment & Plan Note (Signed)
Followed by urology.   

## 2019-11-25 NOTE — Assessment & Plan Note (Signed)
Followed by urology - on flomax.

## 2019-11-25 NOTE — Assessment & Plan Note (Signed)
Discussed caregiver burnout, and importance of self care. Reviewed healthy stress relieving strategies. Not interested in medication at this time. Advised let us know if worsening symptoms.

## 2019-11-25 NOTE — Assessment & Plan Note (Signed)

## 2019-11-25 NOTE — Assessment & Plan Note (Signed)
Chronic, continues low dose metformin 500mg  daily. Update A1c.

## 2019-11-25 NOTE — Assessment & Plan Note (Signed)
Chronic, stable. Continue atorvastatin. Update FLP.  The ASCVD Risk score Richard Bussing DC Jr., et al., 2013) failed to calculate for the following reasons:   The valid total cholesterol range is 130 to 320 mg/dL

## 2019-11-25 NOTE — Assessment & Plan Note (Addendum)
12 lb weight loss noted over the past year. Encouraged healthy diet and lifestyle choices to affect sustainable weight loss.

## 2019-11-25 NOTE — Assessment & Plan Note (Signed)
Check CBC - ?OSA related.

## 2019-11-25 NOTE — Assessment & Plan Note (Signed)
New small mass to R posterolateral neck - ?LN vs lipoma.  Will order neck US - pt requests to be done closer to home. Ordered.

## 2019-11-25 NOTE — Assessment & Plan Note (Signed)
Chronic, stable on allopurinol without recent gout flare.  Check uric acid.

## 2020-02-13 ENCOUNTER — Other Ambulatory Visit: Payer: Self-pay | Admitting: Family Medicine

## 2020-02-14 NOTE — Telephone Encounter (Signed)
Pharmacy requests refill on: Allopurinol 300 mg   LAST REFILL: 11/24/2019 (Q-90, R-3) LAST OV: 11/24/2019 NEXT OV: 12/01/2020 PHARMACY: [id number] [place], [place]

## 2020-06-28 ENCOUNTER — Encounter: Payer: Self-pay | Admitting: Family Medicine

## 2020-06-28 ENCOUNTER — Ambulatory Visit: Payer: Federal, State, Local not specified - PPO | Admitting: Family Medicine

## 2020-06-28 ENCOUNTER — Ambulatory Visit (INDEPENDENT_AMBULATORY_CARE_PROVIDER_SITE_OTHER): Payer: Medicare Other | Admitting: Family Medicine

## 2020-06-28 ENCOUNTER — Other Ambulatory Visit: Payer: Self-pay

## 2020-06-28 VITALS — BP 122/76 | HR 94 | Temp 97.7°F | Ht 69.5 in | Wt 233.0 lb

## 2020-06-28 DIAGNOSIS — R053 Chronic cough: Secondary | ICD-10-CM

## 2020-06-28 DIAGNOSIS — E119 Type 2 diabetes mellitus without complications: Secondary | ICD-10-CM

## 2020-06-28 DIAGNOSIS — J302 Other seasonal allergic rhinitis: Secondary | ICD-10-CM

## 2020-06-28 DIAGNOSIS — M25512 Pain in left shoulder: Secondary | ICD-10-CM

## 2020-06-28 HISTORY — DX: Chronic cough: R05.3

## 2020-06-28 LAB — POCT GLYCOSYLATED HEMOGLOBIN (HGB A1C): Hemoglobin A1C: 6 % — AB (ref 4.0–5.6)

## 2020-06-28 NOTE — Assessment & Plan Note (Addendum)
Chronic issue. Discussed common causes of this including GERD and allergic rhinitis. Suspect current cough may be allergic in nature - rec antihistamine, INS regularly for several weeks, update [place] with effect. Pt agrees with plan.

## 2020-06-28 NOTE — Progress Notes (Signed)
Patient ID: Richard Case, male    DOB: 09/24/1955, 65 y.o.   MRN: [id number]  This visit was conducted in person.  BP 122/76   Pulse 94   Temp 97.7 F (36.5 C) (Temporal)   Ht 5' 9.5" (1.765 m)   Wt 233 lb (105.7 kg)   SpO2 98%   BMI 33.91 kg/m    CC: several concerns Subjective:   HPI: Richard Case is a 65 y.o. male presenting on 06/28/2020 for Diabetes, Cough, and Shoulder Pain   FIL has moved out.   DM - does regularly check sugars, notes increased readings over the past 1-2 weeks. - 150-160 fasting, normally it runs 130s. Compliant with antihyperglycemic regimen which includes: metformin 500mg  daily. Denies low sugars or hypoglycemic symptoms. Denies paresthesias. Last diabetic eye exam DUE. Pneumovax: 2017. Prevnar: 2021. Glucometer brand: livongo. DSME: has not completed. Lab Results  Component Value Date   HGBA1C 6.0 (A) 06/28/2020   Diabetic Foot Exam - Simple   Simple Foot Form Diabetic Foot exam was performed with the following findings: Yes 06/28/2020  4:36 PM  Visual Inspection No deformities, no ulcerations, no other skin breakdown bilaterally: Yes Sensation Testing Intact to touch and monofilament testing bilaterally: Yes Pulse Check Posterior Tibialis and Dorsalis pulse intact bilaterally: Yes Comments    Lab Results  Component Value Date   MICROALBUR 1.0 11/24/2019    L shoulder pain that started since used log splitter ~1 month ago. May be slowly improving. Points to upper L shoulder worse with arm movement. Denies other fall/injury. Treating with OTC NSAID  Notes persistent cough throughout the day, productive of clear mucous, ongoing for years. No recent cold symptoms. Hasn't tried anything for this yet.  Chronic sneezing, congestion throughout the year.  Notes intermittent GERD symptoms.  New dog at home - hypoallergenic.  Known pet dander and dust allergy.  Non smoker  Did have COVID 01/2020 - symptoms fully resolved.  Upcoming trip to  [place] this September. He received booster.      Relevant past medical, surgical, family and social history reviewed and updated as indicated. Interim medical history since our last visit reviewed. Allergies and medications reviewed and updated. Outpatient Medications Prior to Visit  Medication Sig Dispense Refill   allopurinol (ZYLOPRIM) 300 MG tablet Take 1 tablet (300 mg total) by mouth daily. 90 tablet 3   aspirin 81 MG chewable tablet Chew 1 tablet (81 mg total) by mouth daily.     atorvastatin (LIPITOR) 20 MG tablet Take 1 tablet (20 mg total) by mouth daily. 90 tablet 3   ibuprofen (ADVIL,MOTRIN) 200 MG tablet Take 400 mg by mouth every 6 (six) hours as needed for moderate pain.     metFORMIN (GLUCOPHAGE) 500 MG tablet Take 1 tablet (500 mg total) by mouth daily with breakfast. 90 tablet 3   scopolamine (TRANSDERM-SCOP, 1.5 MG,) 1 MG/3DAYS Place 1 patch (1.5 mg total) onto the skin every 3 (three) days. (Patient taking differently: Place 1 patch onto the skin every 3 (three) days. Taking PRN) 4 patch 0   tamsulosin (FLOMAX) 0.4 MG CAPS capsule TAKE 1 CAPSULE BY MOUTH EVERY DAY 90 capsule 4   triamcinolone cream (KENALOG) 0.1 % Apply topically as needed. 30 g 0   No facility-administered medications prior to visit.     Per HPI unless specifically indicated in ROS section below Review of Systems Objective:  BP 122/76   Pulse 94   Temp 97.7 F (36.5 C) (Temporal)  Ht 5' 9.5" (1.765 m)   Wt 233 lb (105.7 kg)   SpO2 98%   BMI 33.91 kg/m   Wt Readings from Last 3 Encounters:  06/28/20 233 lb (105.7 kg)  11/24/19 234 lb 5 oz (106.3 kg)  10/27/19 233 lb 1.6 oz (105.7 kg)      Physical Exam Vitals and nursing note reviewed.  Constitutional:      Appearance: Normal appearance. He is not ill-appearing.  Eyes:     Extraocular Movements: Extraocular movements intact.     Conjunctiva/sclera: Conjunctivae normal.     Pupils: Pupils are equal, round, and reactive to light.   Cardiovascular:     Rate and Rhythm: Normal rate and regular rhythm.     Pulses: Normal pulses.     Heart sounds: Normal heart sounds. No murmur heard. Pulmonary:     Effort: Pulmonary effort is normal. No respiratory distress.     Breath sounds: Normal breath sounds. No wheezing, rhonchi or rales.     Comments: Lungs clear Musculoskeletal:     Right lower leg: No edema.     Left lower leg: No edema.     Comments:  See HPI for foot exam if done R shoulder WNL L shoulder exam: No deformity of shoulders on inspection. No pain with palpation of shoulder landmarks. FROM in abduction and forward flexion. No pain or weakness with testing SITS in ext/int rotation. No pain with empty can sign. Neg Speed test. No impingement. No pain with rotation of humeral head in [facility] joint.   Skin:    General: Skin is warm and dry.     Findings: No rash.  Neurological:     Mental Status: He is alert.  Psychiatric:        Mood and Affect: Mood normal.        Behavior: Behavior normal.      Results for orders placed or performed in visit on 06/28/20  POCT glycosylated hemoglobin (Hb A1C)  Result Value Ref Range   Hemoglobin A1C 6.0 (A) 4.0 - 5.6 %   HbA1c POC (<> result, manual entry)     HbA1c, POC (prediabetic range)     HbA1c, POC (controlled diabetic range)     Assessment & Plan:  This visit occurred during the SARS-CoV-2 public health emergency.  Safety protocols were in place, including screening questions prior to the visit, additional usage of staff PPE, and extensive cleaning of exam room while observing appropriate contact time as indicated for disinfecting solutions.   Problem List Items Addressed This Visit     Seasonal allergic rhinitis    Anticipate contributing to cough - see above.  rec antihistamine, INS regularly for several weeks, update [place] with effect. Known pet dander allergy - he has cat and dog at home.        Controlled diabetes mellitus type II without  complication (HCC) - Primary    Chronic, stable. Well controlled based on recent A1c. No med changes made. Did recommend continued efforts towards low sugar low carb diabetic diet. Offered diabetes education - he will consider.        Relevant Orders   POCT glycosylated hemoglobin (Hb A1C) (Completed)   Acute pain of left shoulder    Acute L shoulder pain after repetitive use of log splitter 1 month ago - overall reassuring exam. Suspect RTC tendonitis that is slowly improving. Provided with exercises from [facility] pt advisor, update if not improving with treatment.  Chronic cough    Chronic issue. Discussed common causes of this including GERD and allergic rhinitis. Suspect current cough may be allergic in nature - rec antihistamine, INS regularly for several weeks, update [place] with effect. Pt agrees with plan.          No orders of the defined types were placed in this encounter.  Orders Placed This Encounter  Procedures   POCT glycosylated hemoglobin (Hb A1C)    Patient Instructions  Send me dates of COVID vaccines.  Schedule eye exam as you're due. Foot exam today.  Cough may be from allergies or reflux. Take claritin or allegra over the counter daily for 2-3 weeks as well as flonase nasal steroid spray. Let us know if cough not better with this.  For shoulder - possible rotator cuff irritation. May use heating pad or ice to the shoulder, do exercises provided today. Let us know if not improving with this.   Follow up plan: Return if symptoms worsen or fail to improve.  [name], MD

## 2020-06-28 NOTE — Assessment & Plan Note (Signed)
Acute L shoulder pain after repetitive use of log splitter 1 month ago - overall reassuring exam. Suspect RTC tendonitis that is slowly improving. Provided with exercises from [facility] pt advisor, update if not improving with treatment.

## 2020-06-28 NOTE — Assessment & Plan Note (Signed)
Chronic, stable. Well controlled based on recent A1c. No med changes made. Did recommend continued efforts towards low sugar low carb diabetic diet. Offered diabetes education - he will consider.

## 2020-06-28 NOTE — Assessment & Plan Note (Addendum)
Anticipate contributing to cough - see above.  rec antihistamine, INS regularly for several weeks, update [place] with effect. Known pet dander allergy - he has cat and dog at home.

## 2020-06-28 NOTE — Patient Instructions (Addendum)
Send me dates of COVID vaccines.  Schedule eye exam as you're due. Foot exam today.  Cough may be from allergies or reflux. Take claritin or allegra over the counter daily for 2-3 weeks as well as flonase nasal steroid spray. Let us know if cough not better with this.  For shoulder - possible rotator cuff irritation. May use heating pad or ice to the shoulder, do exercises provided today. Let us know if not improving with this.

## 2020-09-05 ENCOUNTER — Other Ambulatory Visit: Payer: Self-pay | Admitting: Family Medicine

## 2020-09-05 MED ORDER — SCOPOLAMINE 1 MG/3DAYS TD PT72: 1.0000 | MEDICATED_PATCH | TRANSDERMAL | 0 refills | Status: DC

## 2020-10-24 ENCOUNTER — Ambulatory Visit
Admission: RE | Admit: 2020-10-24 | Discharge: 2020-10-24 | Disposition: A | Discharge status: Home / Self Care | Payer: Medicare Other | Source: Ambulatory Visit | Attending: Urology | Admitting: Urology

## 2020-10-24 ENCOUNTER — Other Ambulatory Visit: Payer: Self-pay

## 2020-10-24 DIAGNOSIS — R3912 Poor urinary stream: Secondary | ICD-10-CM | POA: Insufficient documentation

## 2020-10-24 DIAGNOSIS — N401 Enlarged prostate with lower urinary tract symptoms: Secondary | ICD-10-CM | POA: Insufficient documentation

## 2020-10-24 DIAGNOSIS — N138 Other obstructive and reflux uropathy: Secondary | ICD-10-CM | POA: Insufficient documentation

## 2020-10-26 ENCOUNTER — Ambulatory Visit: Payer: Medicare Other | Admitting: Urology

## 2020-10-30 NOTE — Progress Notes (Signed)
9:29 PM   Richard Case February 28, 1955 [id number]  Referring provider: [name], MD [address] [place],  [place] [address]  Chief Complaint  Patient presents with   Benign Prostatic Hypertrophy    Urological history: 1. Nephrolithiasis -stone composition of 93% calcium oxalate monohydrate, 5% calcium oxalate dihydrate and 2% calcium phosphate carbonate -no stone appreciated on RUS 10/2020  2. Complex renal cyst -RUS 10/2020 - No significant change in bilateral renal cysts allowing for differences in technique and caliper placement.  3. BPH with LU TS -PSA pending -prostate volume 70 cc on RUS 10/2020 -I PSS 5/3 -managed with tamsulosin 0.4 mg daily  4. Family history of prostate cancer -father with non-fatal prostate cancer   HPI: Richard Case is a 65 y.o. male who presents today for yearly visit.    RUS 10/2020 - Left kidney: two simple cysts, 2.7 cm cyst in the upper kidney, 2.3 cm cyst in the lower kidney.  - Right kidney: No significant change in the 1.1 cm cyst in the mid kidney with internal septation  He has complaints of urgency, incontinence and a weak urinary stream.  Patient denies any modifying or aggravating factors.  Patient denies any gross hematuria, dysuria or suprapubic/flank pain.  Patient denies any fevers, chills, nausea or vomiting.     IPSS     Row Name 10/31/20 1500         International Prostate Symptom Score   How often have you had the sensation of not emptying your bladder? Less than 1 in 5     How often have you had to urinate less than every two hours? About half the time     How often have you found you stopped and started again several times when you urinated? Not at All     How often have you found it difficult to postpone urination? Not at All     How often have you had a weak urinary stream? Not at All     How often have you had to strain to start urination? Not at All     How many times did you typically get  up at night to urinate? 1 Time     Total IPSS Score 5       Quality of Life due to urinary symptoms   If you were to spend the rest of your life with your urinary condition just the way it is now how would you feel about that? Mixed               Score:  1-7 Mild 8-19 Moderate 20-35 Severe  PMH: Past Medical History:  Diagnosis Date   ADD (attention deficit disorder) 2011   no records of workup; strattera caused urinary retention, not interested in habit forming medication   Atrial ectopy 2011   improved with CPAP, documented by holter   Benign colon polyp ?2014   hyperplastic   BPH (benign prostatic hypertrophy)    per prior PCP records   Diabetes mellitus without complication (HCC)    Elevated blood pressure (not hypertension)    Erectile dysfunction    GERD (gastroesophageal reflux disease)    Gout    Heart murmur longstanding   History of kidney stones 2008, 2017   History of pneumonia 2009   HLD (hyperlipidemia)    Kidney stones    HX   Male erectile dysfunction    Obesity, Class I, BMI 30-34.9    OSA (obstructive  sleep apnea) 2010   OSA with stabilization at CPAP 7cm   Peyronie disease    per prior pcp records   Polycythemia    ?OSA related   Prediabetes    Rosacea    per prior pcp records   Seasonal allergic rhinitis    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    Surgical History: Past Surgical History:  Procedure Laterality Date   CARDIOVASCULAR STRESS TEST  07/2017   low risk stress test, hypertensive response (End)   COLONOSCOPY  2009   rec rpt 5 yrs per prior PCP records but no actual report   COLONOSCOPY  03/2012   mild diverticulosis, rpt 5 yrs (Dr Hulan Saas in [place])   COLONOSCOPY WITH PROPOFOL N/A 12/18/2017   TAx2, HP, diverticulosis, rpt 5 yrs Vicente Males, Bailey Mech, MD)   [place] LITHOTRIPSY Right 04/06/2015   Procedure: EXTRACORPOREAL SHOCK WAVE LITHOTRIPSY (ESWL);  Surgeon: Nickie Retort, MD;  Location:  [facility] ORS;  Service: Urology;  Laterality: Right;   MOHS SURGERY  2015   SCC of nose   TONSILLECTOMY      Home Medications:  Allergies as of 10/31/2020       Reactions   Strattera [atomoxetine Hcl] Other (See Comments)   Urinary retention        Medication List        Accurate as of October 31, 2020 11:59 PM. If you have any questions, ask your nurse or doctor.          allopurinol 300 MG tablet Commonly known as: ZYLOPRIM Take 1 tablet (300 mg total) by mouth daily.   aspirin 81 MG chewable tablet Chew 1 tablet (81 mg total) by mouth daily.   atorvastatin 20 MG tablet Commonly known as: LIPITOR Take 1 tablet (20 mg total) by mouth daily.   ibuprofen 200 MG tablet Commonly known as: ADVIL Take 400 mg by mouth every 6 (six) hours as needed for moderate pain.   metFORMIN 500 MG tablet Commonly known as: GLUCOPHAGE Take 1 tablet (500 mg total) by mouth daily with breakfast.   scopolamine 1 MG/3DAYS Commonly known as: Transderm-Scop (1.5 MG) Place 1 patch (1.5 mg total) onto the skin every 3 (three) days.   tamsulosin 0.4 MG Caps capsule Commonly known as: FLOMAX TAKE 1 CAPSULE BY MOUTH EVERY DAY   triamcinolone cream 0.1 % Commonly known as: KENALOG Apply topically as needed.        Allergies:  Allergies  Allergen Reactions   Strattera [Atomoxetine Hcl] Other (See Comments)    Urinary retention    Family History: Family History  Problem Relation Age of Onset   Cancer Father 15       prostate   CAD Father        possibly?   Prostate cancer Father    Heart Problems Father    Cancer Mother        lung   Lung disease Mother    Stroke Neg Hx    Diabetes Neg Hx    Kidney disease Neg Hx    Kidney cancer Neg Hx    Bladder Cancer Neg Hx     Social History:  reports that he has never smoked. He has never used smokeless tobacco. He reports current alcohol use. He reports that he does not use drugs.  ROS: For pertinent review of systems please  refer to history of present illness  Physical Exam: BP (!) 147/84   Pulse 94  Ht 5' 9.5" (1.765 m)   Wt 235 lb (106.6 kg)   BMI 34.21 kg/m   Constitutional:  Well nourished. Alert and oriented, No acute distress. HEENT: [place] AT, mask in place.  Trachea midline Cardiovascular: No clubbing, cyanosis, or edema. Respiratory: Normal respiratory effort, no increased work of breathing. GU: No CVA tenderness.  No bladder fullness or masses.  Patient with circumcised phallus.  Urethral meatus is patent.  No penile discharge. No penile lesions or rashes. Scrotum without lesions, cysts, rashes and/or edema.  Testicles are located scrotally bilaterally. No masses are appreciated in the testicles. Left and right epididymis are normal. Rectal: Patient with  normal sphincter tone. Anus and perineum without scarring or rashes. No rectal masses are appreciated. Prostate is approximately 50 grams, no nodules are appreciated. Seminal vesicles could not be palpated Neurologic: Grossly intact, no focal deficits, moving all 4 extremities. Psychiatric: Normal mood and affect.   Laboratory Data: Lab Results  Component Value Date   WBC 8.8 11/24/2019   HGB 17.1 (H) 11/24/2019   HCT 51.0 11/24/2019   MCV 93.9 11/24/2019   PLT 231.0 11/24/2019   Lab Results  Component Value Date   CREATININE 1.29 11/24/2019   Lab Results  Component Value Date   PSA 1.04 04/01/2016   PSA 1.16 03/22/2015   PSA 1.010 05/04/2013   Lab Results  Component Value Date   HGBA1C 6.0 (A) 06/28/2020  I have reviewed the labs  Pertinent Imaging: CLINICAL DATA:  BPH with obstruction/lower urinary tract symptoms. Benign prosthetic hypertrophy with weak urinary stream. One year follow-up.   EXAM: RENAL / URINARY TRACT ULTRASOUND COMPLETE   COMPARISON:  Renal ultrasound 10/27/2019, additional prior exams reviewed. CT 08/22/2015   FINDINGS: Right Kidney:   Renal measurements: 12.1 x 5.5 x 4.7 cm = volume: 164 mL.  No hydronephrosis. Normal parenchymal echogenicity. No significant change in the 1.1 cm cyst in the mid kidney with internal septation, allowing for differences in technique and caliper placement. No new or solid lesion. No visualized stone.   Left Kidney:   Renal measurements: 13.0 x 5.3 x 4.0 cm = volume: 144 mL. Two simple cysts, 2.7 cm cyst in the upper kidney, 2.3 cm cyst in the lower kidney. No new or solid lesion. No hydronephrosis. Tiny renal stone on prior CT not seen by ultrasound.   Bladder:   Appears normal for degree of bladder distention. Prevoid bladder volume of 429 cc, postvoid residual of 44 cc.   Other:   Prostate gland measures 5.1 x 6.6 x 4.0 cm. Incidental note of hepatic steatosis, also described on prior imaging.   IMPRESSION: 1. No significant change in bilateral renal cysts allowing for differences in technique and caliper placement. 2. Postvoid bladder residual of 44 cc. 3. Enlarged prostate gland.     Electronically Signed   By: Keith Rake M.D.   On: 10/27/2020 12:21 I have independently reviewed the films.  See HPI.   Assessment & Plan:    1. BPH with LUTS -PSA pending -DRE benign -symptoms - urgency, urge incontinence and weak stream -continue conservative management, avoiding bladder irritants and timed voiding's -Continue tamsulosin 0.4 mg daily-refills given  2. Minimally complex right renal cyst Cyst remains unchanged  RUS in one year for surveillance   Return in about 1 year (around 10/31/2021) for RUS, PSA, IPSS, SHIM and exam .  These notes generated with voice recognition software. I apologize for typographical errors.  Royden Purl  [place]  [address] [place], [place] [address] [phone]

## 2020-10-31 ENCOUNTER — Other Ambulatory Visit: Payer: Self-pay

## 2020-10-31 ENCOUNTER — Ambulatory Visit (INDEPENDENT_AMBULATORY_CARE_PROVIDER_SITE_OTHER): Payer: Medicare Other | Admitting: Urology

## 2020-10-31 ENCOUNTER — Encounter: Payer: Self-pay | Admitting: Urology

## 2020-10-31 VITALS — BP 147/84 | HR 94 | Ht 69.5 in | Wt 235.0 lb

## 2020-10-31 DIAGNOSIS — N401 Enlarged prostate with lower urinary tract symptoms: Secondary | ICD-10-CM

## 2020-10-31 DIAGNOSIS — N138 Other obstructive and reflux uropathy: Secondary | ICD-10-CM | POA: Diagnosis not present

## 2020-10-31 DIAGNOSIS — N281 Cyst of kidney, acquired: Secondary | ICD-10-CM | POA: Diagnosis not present

## 2020-11-01 LAB — PSA: Prostate Specific Ag, Serum: 1.4 ng/mL (ref 0.0–4.0)

## 2020-11-10 ENCOUNTER — Other Ambulatory Visit: Payer: Self-pay | Admitting: Urology

## 2020-11-10 DIAGNOSIS — N401 Enlarged prostate with lower urinary tract symptoms: Secondary | ICD-10-CM

## 2020-11-24 ENCOUNTER — Ambulatory Visit: Payer: Federal, State, Local not specified - PPO

## 2020-12-01 ENCOUNTER — Encounter: Payer: Federal, State, Local not specified - PPO | Admitting: Family Medicine

## 2020-12-18 ENCOUNTER — Encounter: Payer: Federal, State, Local not specified - PPO | Admitting: Family Medicine

## 2020-12-25 ENCOUNTER — Other Ambulatory Visit: Payer: Self-pay | Admitting: Family Medicine

## 2020-12-29 ENCOUNTER — Encounter: Payer: Federal, State, Local not specified - PPO | Admitting: Family Medicine

## 2021-02-11 ENCOUNTER — Other Ambulatory Visit: Payer: Self-pay | Admitting: Family Medicine

## 2021-02-13 NOTE — Telephone Encounter (Signed)
Plz schedule lab and cpe visits.  Then send encounter back to me for refill.

## 2021-02-14 ENCOUNTER — Encounter: Payer: Self-pay | Admitting: Family Medicine

## 2021-02-14 NOTE — Telephone Encounter (Signed)
Called pt to schedule CPE. Lvmtcb to schedule. Sent mychart message

## 2021-02-16 NOTE — Telephone Encounter (Signed)
E-scribed refill.  Pt will need appt for additional refills.

## 2021-02-16 NOTE — Telephone Encounter (Signed)
Called pt and lvmtcb to schedule CPE

## 2021-03-27 ENCOUNTER — Ambulatory Visit: Payer: Federal, State, Local not specified - PPO | Admitting: Family Medicine

## 2021-04-16 ENCOUNTER — Ambulatory Visit (INDEPENDENT_AMBULATORY_CARE_PROVIDER_SITE_OTHER): Payer: Medicare Other | Admitting: Family Medicine

## 2021-04-16 ENCOUNTER — Encounter: Payer: Self-pay | Admitting: Family Medicine

## 2021-04-16 VITALS — BP 124/70 | HR 85 | Temp 97.6°F | Ht 69.5 in | Wt 237.4 lb

## 2021-04-16 DIAGNOSIS — G4733 Obstructive sleep apnea (adult) (pediatric): Secondary | ICD-10-CM | POA: Diagnosis not present

## 2021-04-16 DIAGNOSIS — C4442 Squamous cell carcinoma of skin of scalp and neck: Secondary | ICD-10-CM

## 2021-04-16 DIAGNOSIS — G309 Alzheimer's disease, unspecified: Secondary | ICD-10-CM

## 2021-04-16 DIAGNOSIS — E119 Type 2 diabetes mellitus without complications: Secondary | ICD-10-CM

## 2021-04-16 DIAGNOSIS — R413 Other amnesia: Secondary | ICD-10-CM | POA: Insufficient documentation

## 2021-04-16 LAB — BASIC METABOLIC PANEL
BUN: 14 mg/dL (ref 6–23)
CO2: 31 mEq/L (ref 19–32)
Calcium: 9.9 mg/dL (ref 8.4–10.5)
Chloride: 103 mEq/L (ref 96–112)
Creatinine, Ser: 1.19 mg/dL (ref 0.40–1.50)
GFR: 63.52 mL/min (ref >60.00)
Glucose, Bld: 130 mg/dL — ABNORMAL HIGH (ref 70–99)
Potassium: 4.5 mEq/L (ref 3.5–5.1)
Sodium: 142 mEq/L (ref 135–145)

## 2021-04-16 LAB — CBC WITH DIFFERENTIAL/PLATELET
Basophils Absolute: 0 10*3/uL (ref 0.0–0.1)
Basophils Relative: 0.5 % (ref 0.0–3.0)
Eosinophils Absolute: 0.2 10*3/uL (ref 0.0–0.7)
Eosinophils Relative: 2.6 % (ref 0.0–5.0)
HCT: 48.4 % (ref 39.0–52.0)
Hemoglobin: 16.6 g/dL (ref 13.0–17.0)
Lymphocytes Relative: 26.4 % (ref 12.0–46.0)
Lymphs Abs: 1.8 10*3/uL (ref 0.7–4.0)
MCHC: 34.3 g/dL (ref 30.0–36.0)
MCV: 93 fl (ref 78.0–100.0)
Monocytes Absolute: 0.9 10*3/uL (ref 0.1–1.0)
Monocytes Relative: 13.2 % — ABNORMAL HIGH (ref 3.0–12.0)
Neutro Abs: 3.9 10*3/uL (ref 1.4–7.7)
Neutrophils Relative %: 57.3 % (ref 43.0–77.0)
Platelets: 207 10*3/uL (ref 150.0–400.0)
RBC: 5.2 Mil/uL (ref 4.22–5.81)
RDW: 13 % (ref 11.5–15.5)
WBC: 6.8 10*3/uL (ref 4.0–10.5)

## 2021-04-16 LAB — TSH: TSH: 1.37 u[IU]/mL (ref 0.35–5.50)

## 2021-04-16 LAB — VITAMIN B12: Vitamin B-12: 357 pg/mL (ref 211–911)

## 2021-04-16 LAB — HEMOGLOBIN A1C: Hgb A1c MFr Bld: 6.2 % (ref 4.6–6.5)

## 2021-04-16 MED ORDER — VITAMIN B-12 1000 MCG PO TABS: 1000.0000 ug | ORAL_TABLET | Freq: Every day | ORAL | Status: DC

## 2021-04-16 NOTE — Progress Notes (Signed)
? ? Patient ID: Richard Case, male    DOB: 1955-09-03, 66 y.o.   MRN: [id number] ? ?This visit was conducted in person. ? ?BP 124/70   Pulse 85   Temp 97.6 ?F (36.4 ?C) (Temporal)   Ht 5' 9.5" (1.765 m)   Wt 237 lb 6 oz (107.7 kg)   SpO2 96%   BMI 34.55 kg/m?   ? ?CC: discuss memory ?Subjective:  ? ?HPI: ?Richard Case is a 66 y.o. male presenting on 04/16/2021 for Altered Mental Status (C/o moments of confusion. Sxs started about 1 mo ago. Pt accompanied by wife, [name]. ) ? ? 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 S/p 3 separate cancer surgeries, started ~01/2020, last one 01/18/2021. Saw wound clinic s/p skin grafting - wound has finally healed.  ?Last diabetic labs 06/2020.  ?Last physical 11/2019 ([facility]).  ? ?Here with wife with several memory concerns noted by her and his brother - increased forgetfulness. Forget nephew's name. Difficulty finding keys when they're right next to him, more easily agitated ie getting food at drive through. Easily irritated, frustrated.  ? ?No significant imbalance/unsteadiness.  ?No significant noted movements in sleep.  ?No visual hallucinations.  ?No stiffness.  ? ?Notes more difficulty going up stairs due to generalized muscle weakness.  ?+ worsening tremor of head > hands.  ?+ cognitive fluctuations.  ?Possible personality change as above.  ? ?Wife notes he doesn't shower as much as he used to.  ?Significant hoarding as well.  ?No known strokes.  ? ?LBD runs in the family - mother, maternal aunt, maternal grandmother.  ?Parkinson's disease runs in paternal side.  ?No alzheimer's in family.  ? ?Upcoming trip to [place].  ? ?Non restorative sleep.  ?Loud snoring.  ?H/o OSA, not regularly using CPAP machine.  ?Wife notes he does regularly take nyquil.  ?Great sugar control to date. ? ?Geriatric Assessment: ?Activities of Daily Living:  ?   Bathing- independent ?   Dressing- independent ?   Eating- independent ?   Toileting-  independent ?   Transferring- independent ?   Continence- independent ?Overall Assessment: independent ? ?Instrumental Activities of Daily Living:  ?   Transportation- dependent - wife has done most of the driving since their marriage 2000 ?   Meal/Food Preparation- partially dependent ?   Shopping Errands- partially dependent  ?   Housekeeping/Chores- dependent  ?   Money Management/Finances- independent - wife notices they've been getting late notices  ?   Medication Management- independent ?   Ability to Use Telephone- independent ?   Laundry- dependent  ?Overall Assessment: partially dependent  ? ?Mental Status Exam: 28/30 (missed 2 calculation) ?    ?Clock Drawing Score: 4/4  ?   ? ?Relevant past medical, surgical, family and social history reviewed and updated as indicated. Interim medical history since our last visit reviewed. ?Allergies and medications reviewed and updated. ?Outpatient Medications Prior to Visit  ?Medication Sig Dispense Refill  ? allopurinol (ZYLOPRIM) 300 MG tablet TAKE 1 TABLET BY MOUTH EVERY DAY 90 tablet 0  ? aspirin 81 MG chewable tablet Chew 1 tablet (81 mg total) by mouth daily.    ? atorvastatin (LIPITOR) 20 MG tablet TAKE 1 TABLET BY MOUTH EVERY DAY 90 tablet 2  ? ibuprofen (ADVIL,MOTRIN) 200 MG tablet Take 400 mg by mouth every 6 (six) hours as needed for moderate pain.    ? metFORMIN (GLUCOPHAGE) 500 MG tablet TAKE 1 TABLET BY  MOUTH EVERY DAY WITH BREAKFAST 90 tablet 2  ? tamsulosin (FLOMAX) 0.4 MG CAPS capsule TAKE 1 CAPSULE BY MOUTH EVERY DAY 90 capsule 4  ? triamcinolone cream (KENALOG) 0.1 % Apply topically as needed. 30 g 0  ? scopolamine (TRANSDERM-SCOP, 1.5 MG,) 1 MG/3DAYS Place 1 patch (1.5 mg total) onto the skin every 3 (three) days. 4 patch 0  ? ?No facility-administered medications prior to visit.  ?  ? ?Per HPI unless specifically indicated in ROS section below ?Review of Systems ? ?Objective:  ?BP 124/70   Pulse 85   Temp 97.6 ?F (36.4 ?C) (Temporal)   Ht 5' 9.5"  (1.765 m)   Wt 237 lb 6 oz (107.7 kg)   SpO2 96%   BMI 34.55 kg/m?   ?Wt Readings from Last 3 Encounters:  ?04/16/21 237 lb 6 oz (107.7 kg)  ?10/31/20 235 lb (106.6 kg)  ?06/28/20 233 lb (105.7 kg)  ?  ?  ?Physical Exam ?Vitals and nursing note reviewed.  ?Constitutional:   ?   Appearance: Normal appearance. He is obese. He is not ill-appearing.  ?Neck:  ?   Thyroid: No thyroid mass or thyromegaly.  ?Cardiovascular:  ?   Rate and Rhythm: Normal rate and regular rhythm.  ?   Pulses: Normal pulses.  ?   Heart sounds: Normal heart sounds. No murmur heard. ?Pulmonary:  ?   Effort: Pulmonary effort is normal. No respiratory distress.  ?   Breath sounds: Normal breath sounds. No wheezing, rhonchi or rales.  ?Musculoskeletal:  ?   Cervical back: Normal range of motion and neck supple.  ?Skin: ?   General: Skin is warm and dry.  ?   Findings: No rash.  ?Neurological:  ?   General: No focal deficit present.  ?   Mental Status: He is alert.  ?   Cranial Nerves: Cranial nerves 2-12 are intact.  ?   Sensory: Sensation is intact.  ?   Motor: Motor function is intact.  ?   Coordination: Coordination is intact. Romberg sign negative. Coordination normal. Finger-Nose-Finger Test normal.  ?   Gait: Gait is intact.  ?   Comments:  ?CN 2-12 intact  ?FTN intact ?EOMI ?Sensation intact ?No pronator drift  ?Psychiatric:     ?   Behavior: Behavior normal.  ? ?   ?Results for orders placed or performed in visit on 04/16/21  ?CBC with Differential/Platelet  ?Result Value Ref Range  ? WBC 6.8 4.0 - 10.5 K/uL  ? RBC 5.20 4.22 - 5.81 Mil/uL  ? Hemoglobin 16.6 13.0 - 17.0 g/dL  ? HCT 48.4 39.0 - 52.0 %  ? MCV 93.0 78.0 - 100.0 fl  ? MCHC 34.3 30.0 - 36.0 g/dL  ? RDW 13.0 11.5 - 15.5 %  ? Platelets 207.0 150.0 - 400.0 K/uL  ? Neutrophils Relative % 57.3 43.0 - 77.0 %  ? Lymphocytes Relative 26.4 12.0 - 46.0 %  ? Monocytes Relative 13.2 (H) 3.0 - 12.0 %  ? Eosinophils Relative 2.6 0.0 - 5.0 %  ? Basophils Relative 0.5 0.0 - 3.0 %  ? Neutro Abs  3.9 1.4 - 7.7 K/uL  ? Lymphs Abs 1.8 0.7 - 4.0 K/uL  ? Monocytes Absolute 0.9 0.1 - 1.0 K/uL  ? Eosinophils Absolute 0.2 0.0 - 0.7 K/uL  ? Basophils Absolute 0.0 0.0 - 0.1 K/uL  ?Basic metabolic panel  ?Result Value Ref Range  ? Sodium 142 135 - 145 mEq/L  ? Potassium 4.5 3.5 - 5.1  mEq/L  ? Chloride 103 96 - 112 mEq/L  ? CO2 31 19 - 32 mEq/L  ? Glucose, Bld 130 (H) 70 - 99 mg/dL  ? BUN 14 6 - 23 mg/dL  ? Creatinine, Ser 1.19 0.40 - 1.50 mg/dL  ? GFR 63.52 >60.00 mL/min  ? Calcium 9.9 8.4 - 10.5 mg/dL  ?TSH  ?Result Value Ref Range  ? TSH 1.37 0.35 - 5.50 uIU/mL  ?Vitamin B12  ?Result Value Ref Range  ? Vitamin B-12 357 211 - 911 pg/mL  ?RPR  ?Result Value Ref Range  ? RPR Ser Ql NON-REACTIVE NON-REACTIVE  ?Hemoglobin A1c  ?Result Value Ref Range  ? Hgb A1c MFr Bld 6.2 4.6 - 6.5 %  ? ? ?Assessment & Plan:  ?Overdue for physical-he will return for this. ? ?Problem List Items Addressed This Visit   ? ? Controlled diabetes mellitus type II without complication ([place])  ?  Chronic, stable.  Update A1c.  Continue metformin 500 mg once daily. ?  ?  ? Relevant Orders  ? Hemoglobin A1c (Completed)  ? OSA (obstructive sleep apnea)  ?  Discussed how untreated sleep apnea could contribute to memory issues. ?Pending evaluation, may recommend a neurology referral to sleep doctor. ?  ?  ? Memory deficit - Primary  ?  Many memory concerns noticed that patient's wife and his family, spanning several months to the past year since recent squamous cell treatments.  Predominantly forgetfulness, more easily frustrated/agitated, worsening tremor of head greater than hands, some cognitive fluctuations and possible personality changes as HPI.  Strong family history of Lewy body dementia and Parkinson's disease although I do not see obvious indication of these.  He also has a history of untreated sleep apnea due to difficulty tolerating CPAP machine. ?Overall reassuring geriatric assessment including delete MMSE of 28 out of 30 and normal  clock drawing test.  There is noted partial dependence on instrumental activities of daily living including transportation, food preparation, shopping, housekeeping, laundry. ?I do think this merits further evaluati

## 2021-04-16 NOTE — Patient Instructions (Addendum)
Labs today  ?Memory testing today  ?I will order brain MRI for further evaluation. ?If ongoing difficulty, I recommend neurology evaluation 0- keep me updated.  ?Good to see you today  ?Avoid nyquil, scopolamine patches.  ?Return at your convenience for physical.  ? ?Reviewed 4 core lifestyle modifications to support a healthy mind:  ?1. Nutritious well balance diet.  ?2. Regular physical activity routine.  ?3. Regular mental activity such as reading books, word puzzles, math puzzles, jigsaw puzzles.  ?4. Social engagement.  ?Also ensure good blood pressure control, limit alcohol, no smoking.   ?

## 2021-04-17 DIAGNOSIS — C4442 Squamous cell carcinoma of skin of scalp and neck: Secondary | ICD-10-CM | POA: Insufficient documentation

## 2021-04-17 LAB — RPR: RPR Ser Ql: NONREACTIVE

## 2021-04-17 NOTE — Assessment & Plan Note (Addendum)
Many memory concerns noticed that patient's wife and his family, spanning several months to the past year since recent squamous cell treatments.  Predominantly forgetfulness, more easily frustrated/agitated, worsening tremor of head greater than hands, some cognitive fluctuations and possible personality changes as HPI.  Strong family history of Lewy body dementia and Parkinson's disease although I do not see obvious indication of these.  He also has a history of untreated sleep apnea due to difficulty tolerating CPAP machine. ?Overall reassuring geriatric assessment including delete MMSE of 28 out of 30 and normal clock drawing test.  There is noted partial dependence on instrumental activities of daily living including transportation, food preparation, shopping, housekeeping, laundry. ?I do think this merits further evaluation- we will check memory labs including CBC BMP RPR vitamin B12 and TSH.  I will also order MRI brain and history of recent deep squamous cell status post several excision surgeries and recent memory concerns. ?I did review core lifestyle modifications for healthy mind as per instructions. ?Discussed if ongoing difficulty, neck step is neurology evaluation.  Patient and wife agree with plan. ?I recommend he stop regular NyQuil use, avoid scopolamine patches. ? ?

## 2021-04-17 NOTE — Assessment & Plan Note (Signed)
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  Appreciate dermatology and wound care. ?

## 2021-04-17 NOTE — Assessment & Plan Note (Signed)
Chronic, stable.  Update A1c.  Continue metformin 500 mg once daily. ?

## 2021-04-17 NOTE — Assessment & Plan Note (Signed)
Discussed how untreated sleep apnea could contribute to memory issues. ?Pending evaluation, may recommend a neurology referral to sleep doctor. ?

## 2021-04-25 ENCOUNTER — Encounter: Payer: Self-pay | Admitting: *Deleted

## 2021-04-27 ENCOUNTER — Other Ambulatory Visit: Payer: Self-pay | Admitting: *Deleted

## 2021-04-27 ENCOUNTER — Other Ambulatory Visit: Payer: Medicare Other

## 2021-04-27 ENCOUNTER — Other Ambulatory Visit: Payer: Self-pay | Admitting: Family Medicine

## 2021-04-27 DIAGNOSIS — E119 Type 2 diabetes mellitus without complications: Secondary | ICD-10-CM | POA: Diagnosis not present

## 2021-04-27 LAB — MICROALBUMIN / CREATININE URINE RATIO
Creatinine,U: 174.1 mg/dL
Microalb Creat Ratio: 0.9 mg/g (ref 0.0–30.0)
Microalb, Ur: 1.5 mg/dL (ref 0.0–1.9)

## 2021-04-27 NOTE — Progress Notes (Signed)
Umicroalb ordered today.  ?

## 2021-05-10 ENCOUNTER — Ambulatory Visit: Payer: Federal, State, Local not specified - PPO

## 2021-05-14 ENCOUNTER — Ambulatory Visit
Admission: RE | Admit: 2021-05-14 | Discharge: 2021-05-14 | Disposition: A | Discharge status: Home / Self Care | Payer: Medicare Other | Source: Ambulatory Visit | Attending: Family Medicine | Admitting: Family Medicine

## 2021-05-14 DIAGNOSIS — R413 Other amnesia: Secondary | ICD-10-CM | POA: Diagnosis not present

## 2021-05-17 ENCOUNTER — Other Ambulatory Visit: Payer: Self-pay | Admitting: Family Medicine

## 2021-05-21 ENCOUNTER — Telehealth: Payer: Self-pay | Admitting: Family Medicine

## 2021-05-21 NOTE — Telephone Encounter (Signed)
LVM for pt to rtn my call to schedule AWV with [facility]. Please schedule this appt if pt calls the office.  °

## 2021-05-22 ENCOUNTER — Telehealth: Payer: Self-pay | Admitting: Family Medicine

## 2021-05-22 DIAGNOSIS — C4442 Squamous cell carcinoma of skin of scalp and neck: Secondary | ICD-10-CM

## 2021-05-22 DIAGNOSIS — R413 Other amnesia: Secondary | ICD-10-CM

## 2021-05-22 NOTE — Telephone Encounter (Signed)
Pt called to follow up on a referral for neurology, he said he hasnt heard anything and I didn't see a referral in. 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 Call back is [phone] ?

## 2021-05-24 ENCOUNTER — Ambulatory Visit (INDEPENDENT_AMBULATORY_CARE_PROVIDER_SITE_OTHER): Payer: Medicare Other

## 2021-05-24 VITALS — Ht 70.0 in | Wt 235.0 lb

## 2021-05-24 DIAGNOSIS — Z0000 Encounter for general adult medical examination without abnormal findings: Secondary | ICD-10-CM | POA: Diagnosis not present

## 2021-05-24 NOTE — Patient Instructions (Signed)
Richard Case , ?Thank you for taking time to come for your Medicare Wellness Visit. I appreciate your ongoing commitment to your health goals. Please review the following plan we discussed and let me know if I can assist you in the future.  ? ?Screening recommendations/referrals: ?Colonoscopy: completed 12/18/2017, due 12/19/2022 ?Recommended yearly ophthalmology/optometry visit for glaucoma screening and checkup ?Recommended yearly dental visit for hygiene and checkup ? ?Vaccinations: ?Influenza vaccine: due 08/14/2021 ?Pneumococcal vaccine: completed 11/24/2019 ?Tdap vaccine: completed 03/28/2015, due 03/27/2025 ?Shingles vaccine: completed   ?Covid-19:  03/18/2019, 04/14/2019, 06/16/2020 ? ?Advanced directives: Advance directive discussed with you today.  ? ?Conditions/risks identified: none ? ?Next appointment: Follow up in one year for your annual wellness visit.  ? ?Preventive Care 80 Years and Older, Male ?Preventive care refers to lifestyle choices and visits with your health care provider that can promote health and wellness. ?What does preventive care include? ?A yearly physical exam. This is also called an annual well check. ?Dental exams once or twice a year. ?Routine eye exams. Ask your health care provider how often you should have your eyes checked. ?Personal lifestyle choices, including: ?Daily care of your teeth and gums. ?Regular physical activity. ?Eating a healthy diet. ?Avoiding tobacco and drug use. ?Limiting alcohol use. ?Practicing safe sex. ?Taking low doses of aspirin every day. ?Taking vitamin and mineral supplements as recommended by your health care provider. ?What happens during an annual well check? ?The services and screenings done by your health care provider during your annual well check will depend on your age, overall health, lifestyle risk factors, and family history of disease. ?Counseling  ?Your health care provider may ask you questions about your: ?Alcohol use. ?Tobacco use. ?Drug  use. ?Emotional well-being. ?Home and relationship well-being. ?Sexual activity. ?Eating habits. ?History of falls. ?Memory and ability to understand (cognition). ?Work and work Statistician. ?Screening  ?You may have the following tests or measurements: ?Height, weight, and BMI. ?Blood pressure. ?Lipid and cholesterol levels. These may be checked every 5 years, or more frequently if you are over 35 years old. ?Skin check. ?Lung cancer screening. You may have this screening every year starting at age 55 if you have a 30-pack-year history of smoking and currently smoke or have quit within the past 15 years. ?Fecal occult blood test (FOBT) of the stool. You may have this test every year starting at age 26. ?Flexible sigmoidoscopy or colonoscopy. You may have a sigmoidoscopy every 5 years or a colonoscopy every 10 years starting at age 61. ?Prostate cancer screening. Recommendations will vary depending on your family history and other risks. ?Hepatitis C blood test. ?Hepatitis B blood test. ?Sexually transmitted disease (STD) testing. ?Diabetes screening. This is done by checking your blood sugar (glucose) after you have not eaten for a while (fasting). You may have this done every 1-3 years. ?Abdominal aortic aneurysm (AAA) screening. You may need this if you are a current or former smoker. ?Osteoporosis. You may be screened starting at age 17 if you are at high risk. ?Talk with your health care provider about your test results, treatment options, and if necessary, the need for more tests. ?Vaccines  ?Your health care provider may recommend certain vaccines, such as: ?Influenza vaccine. This is recommended every year. ?Tetanus, diphtheria, and acellular pertussis (Tdap, Td) vaccine. You may need a Td booster every 10 years. ?Zoster vaccine. You may need this after age 69. ?Pneumococcal 13-valent conjugate (PCV13) vaccine. One dose is recommended after age 56. ?Pneumococcal polysaccharide (PPSV23) vaccine. One dose  is  recommended after age 30. ?Talk to your health care provider about which screenings and vaccines you need and how often you need them. ?This information is not intended to replace advice given to you by your health care provider. Make sure you discuss any questions you have with your health care provider. ?Document Released: 01/27/2015 Document Revised: 09/20/2015 Document Reviewed: 11/01/2014 ?Elsevier Interactive Patient Education ? 2017 [employer]. ? ?Fall Prevention in the Home ?Falls can cause injuries. They can happen to people of all ages. There are many things you can do to make your home safe and to help prevent falls. ?What can I do on the outside of my home? ?Regularly fix the edges of walkways and driveways and fix any cracks. ?Remove anything that might make you trip as you walk through a door, such as a raised step or threshold. ?Trim any bushes or trees on the path to your home. ?Use bright outdoor lighting. ?Clear any walking paths of anything that might make someone trip, such as rocks or tools. ?Regularly check to see if handrails are loose or broken. Make sure that both sides of any steps have handrails. ?Any raised decks and porches should have guardrails on the edges. ?Have any leaves, snow, or ice cleared regularly. ?Use sand or salt on walking paths during winter. ?Clean up any spills in your garage right away. This includes oil or grease spills. ?What can I do in the bathroom? ?Use night lights. ?Install grab bars by the toilet and in the tub and shower. Do not use towel bars as grab bars. ?Use non-skid mats or decals in the tub or shower. ?If you need to sit down in the shower, use a plastic, non-slip stool. ?Keep the floor dry. Clean up any water that spills on the floor as soon as it happens. ?Remove soap buildup in the tub or shower regularly. ?Attach bath mats securely with double-sided non-slip rug tape. ?Do not have throw rugs and other things on the floor that can make you  trip. ?What can I do in the bedroom? ?Use night lights. ?Make sure that you have a light by your bed that is easy to reach. ?Do not use any sheets or blankets that are too big for your bed. They should not hang down onto the floor. ?Have a firm chair that has side arms. You can use this for support while you get dressed. ?Do not have throw rugs and other things on the floor that can make you trip. ?What can I do in the kitchen? ?Clean up any spills right away. ?Avoid walking on wet floors. ?Keep items that you use a lot in easy-to-reach places. ?If you need to reach something above you, use a strong step stool that has a grab bar. ?Keep electrical cords out of the way. ?Do not use floor polish or wax that makes floors slippery. If you must use wax, use non-skid floor wax. ?Do not have throw rugs and other things on the floor that can make you trip. ?What can I do with my stairs? ?Do not leave any items on the stairs. ?Make sure that there are handrails on both sides of the stairs and use them. Fix handrails that are broken or loose. Make sure that handrails are as long as the stairways. ?Check any carpeting to make sure that it is firmly attached to the stairs. Fix any carpet that is loose or worn. ?Avoid having throw rugs at the top or bottom of the stairs.  If you do have throw rugs, attach them to the floor with carpet tape. ?Make sure that you have a light switch at the top of the stairs and the bottom of the stairs. If you do not have them, ask someone to add them for you. ?What else can I do to help prevent falls? ?Wear shoes that: ?Do not have high heels. ?Have rubber bottoms. ?Are comfortable and fit you well. ?Are closed at the toe. Do not wear sandals. ?If you use a stepladder: ?Make sure that it is fully opened. Do not climb a closed stepladder. ?Make sure that both sides of the stepladder are locked into place. ?Ask someone to hold it for you, if possible. ?Clearly mark and make sure that you can  see: ?Any grab bars or handrails. ?First and last steps. ?Where the edge of each step is. ?Use tools that help you move around (mobility aids) if they are needed. These include: ?Canes. ?Walkers. ?Scooters. ?Crutches. ?Turn o

## 2021-05-24 NOTE — Progress Notes (Signed)
I reviewed health advisor's note, was available for consultation, and agree with documentation and plan.  

## 2021-05-24 NOTE — Progress Notes (Signed)
?I connected with [name] today by telephone and verified that I am speaking with the correct person using two identifiers. ?Location patient: home ?Location provider: work ?Persons participating in the virtual visit: Sonnie, [place] LPN. ?  ?I discussed the limitations, risks, security and privacy concerns of performing an evaluation and management service by telephone and the availability of in person appointments. I also discussed with the patient that there may be a patient responsible charge related to this service. The patient expressed understanding and verbally consented to this telephonic visit.  ?  ?Interactive audio and video telecommunications were attempted between this provider and patient, however failed, due to patient having technical difficulties OR patient did not have access to video capability.  We continued and completed visit with audio only. ? ?  ? ?Vital signs may be patient reported or missing. ? ?Subjective:  ? Richard Case is a 66 y.o. male who presents for an Initial Medicare Annual Wellness Visit. ? ?Review of Systems    ? ?Cardiac Risk Factors include: advanced age (>38mn, >>49women);diabetes mellitus;dyslipidemia;male gender;obesity (BMI >30kg/m2) ? ?   ?Objective:  ?  ?Today's Vitals  ? 05/24/21 1142  ?Weight: 235 lb (106.6 kg)  ?Height: '5\' 10"'$  (1.778 m)  ? ?Body mass index is 33.72 kg/m?. ? ? ?  05/24/2021  ? 11:53 AM 12/18/2017  ?  8:49 AM 09/02/2014  ?  9:16 AM 08/20/2014  ?  7:02 PM 08/17/2014  ?  5:30 PM  ?Advanced Directives  ?Does Patient Have a Medical Advance Directive? No No No No No  ?Would patient like information on creating a medical advance directive?  No - Patient declined No - patient declined information No - patient declined information No - patient declined information  ? ? ?Current Medications (verified) ?Outpatient Encounter Medications as of 05/24/2021  ?Medication Sig  ? allopurinol (ZYLOPRIM) 300 MG tablet TAKE 1 TABLET BY MOUTH EVERY DAY  ?  aspirin 81 MG chewable tablet Chew 1 tablet (81 mg total) by mouth daily.  ? atorvastatin (LIPITOR) 20 MG tablet TAKE 1 TABLET BY MOUTH EVERY DAY  ? ibuprofen (ADVIL,MOTRIN) 200 MG tablet Take 400 mg by mouth every 6 (six) hours as needed for moderate pain.  ? metFORMIN (GLUCOPHAGE) 500 MG tablet TAKE 1 TABLET BY MOUTH EVERY DAY WITH BREAKFAST  ? tamsulosin (FLOMAX) 0.4 MG CAPS capsule TAKE 1 CAPSULE BY MOUTH EVERY DAY  ? triamcinolone cream (KENALOG) 0.1 % Apply topically as needed.  ? vitamin B-12 (CYANOCOBALAMIN) 1000 MCG tablet Take 1 tablet (1,000 mcg total) by mouth daily.  ? ?No facility-administered encounter medications on file as of 05/24/2021.  ? ? ?Allergies (verified) ?Strattera [atomoxetine hcl]  ? ?History: ?Past Medical History:  ?Diagnosis Date  ? ADD (attention deficit disorder) 2011  ? no records of workup; strattera caused urinary retention, not interested in habit forming medication  ? Atrial ectopy 2011  ? improved with CPAP, documented by holter  ? Benign colon polyp ?2014  ? hyperplastic  ? BPH (benign prostatic hypertrophy)   ? per prior PCP records  ? [place]   ? Elevated blood pressure (not hypertension)   ? Erectile dysfunction   ? GERD (gastroesophageal reflux disease)   ? Gout   ? Heart murmur longstanding  ? History of kidney stones 2008, 2017  ? History of pneumonia 2009  ? HLD (hyperlipidemia)   ? Kidney stones   ? HX  ? Male erectile dysfunction   ?  Obesity, Class I, BMI 30-34.9   ? OSA (obstructive sleep apnea) 2010  ? OSA with stabilization at CPAP 7cm  ? Peyronie disease   ? per prior pcp records  ? Polycythemia   ? ?OSA related  ? Prediabetes   ? Rosacea   ? per prior pcp records  ? Seasonal allergic rhinitis   ? 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 nose  ? ?Past Surgical History:  ?Procedure Laterality Date  ? CARDIOVASCULAR STRESS TEST  07/2017  ? low risk stress test, hypertensive response ([place])  ? COLONOSCOPY  2009  ? rec  rpt 5 yrs per prior PCP records but no actual report  ? COLONOSCOPY  03/2012  ? mild diverticulosis, rpt 5 yrs (Dr Hulan Saas in [place])  ? COLONOSCOPY WITH PROPOFOL N/A 12/18/2017  ? TAx2, HP, diverticulosis, rpt 5 yrs Vicente Males, Bailey Mech, MD)  ? EXTRACORPOREAL SHOCK WAVE LITHOTRIPSY Right 04/06/2015  ? Procedure: EXTRACORPOREAL SHOCK WAVE LITHOTRIPSY (ESWL);  Surgeon: Nickie Retort, MD;  Location: [facility] ORS;  Service: Urology;  Laterality: Right;  ? head surgery    ? 01/2021, squamos cell carcinoma  ? MOHS SURGERY  2015  ? SCC of nose  ? TONSILLECTOMY    ? ?Family History  ?Problem Relation Age of Onset  ? Cancer Father 83  ?     prostate  ? CAD Father   ?     possibly?  ? Prostate cancer Father   ? Heart Problems Father   ? Cancer Mother   ?     lung  ? Lung disease Mother   ? Stroke Neg Hx   ? Diabetes Neg Hx   ? Kidney disease Neg Hx   ? Kidney cancer Neg Hx   ? Bladder Cancer Neg Hx   ? ?Social History  ? ?Socioeconomic History  ? Marital status: Married  ?  Spouse name: Not on file  ? Number of children: Not on file  ? Years of education: Not on file  ? Highest education level: Not on file  ?Occupational History  ? Not on file  ?Tobacco Use  ? Smoking status: Never  ? Smokeless tobacco: Never  ?Vaping Use  ? Vaping Use: Never used  ?Substance and Sexual Activity  ? Alcohol use: Yes  ?  Comment: once a month  ? Drug use: No  ? Sexual activity: Not on file  ?Other Topics Concern  ? Not on file  ?Social History Narrative  ? Lives with wife Otho Perl, 1 dog  ? Occupation: retired - Civil engineer, contracting for [employer]  ? Edu: BS  ? Activity: active in yard  ? Diet: good water, fruits/vegetables daily  ? ?Social Determinants of Health  ? ?Financial Resource Strain: Low Risk   ? Difficulty of Paying Living Expenses: Not hard at all  ?Food Insecurity: No Food Insecurity  ? Worried About Charity fundraiser in the Last Year: Never true  ? Ran Out of Food in the Last Year: Never true  ?Transportation Needs: No Transportation Needs   ? Lack of Transportation (Medical): No  ? Lack of Transportation (Non-Medical): No  ?Physical Activity: Inactive  ? Days of Exercise per Week: 0 days  ? Minutes of Exercise per Session: 0 min  ?Stress: No Stress Concern Present  ? Feeling of Stress : Not at all  ?Social Connections: Not on file  ? ? ?Tobacco Counseling ?Counseling given: Not Answered ? ? ?Clinical Intake: ? ?Pre-visit preparation completed: Yes ? ?  Pain : No/denies pain ? ?  ? ?Nutritional Status: BMI > 30  Obese ?Nutritional Risks: None ?Diabetes: Yes ? ?How often do you need to have someone help you when you read instructions, pamphlets, or other written materials from your doctor or pharmacy?: 1 - Never ?What is the last grade level you completed in school?: bachelor's degree ? ?Diabetic? Yes ?Nutrition Risk Assessment: ? ?Has the patient had any N/V/D within the last 2 months?  No  ?Does the patient have any non-healing wounds?  Yes  ?Has the patient had any unintentional weight loss or weight gain?  No  ? ?Diabetes: ? ?Is the patient diabetic?  Yes  ?If diabetic, was a CBG obtained today?  No  ?Did the patient bring in their glucometer from home?  No  ?How often do you monitor your CBG's? daily.  ? ?Financial Strains and Diabetes Management: ? ?Are you having any financial strains with the device, your supplies or your medication? No .  ?Does the patient want to be seen by Chronic Care Management for management of their diabetes?  No  ?Would the patient like to be referred to a Nutritionist or for Diabetic Management?  No  ? ?Diabetic Exams: ? ?Diabetic Eye Exam: Overdue for diabetic eye exam. Pt has been advised about the importance in completing this exam. Patient advised to call and schedule an eye exam. ?Diabetic Foot Exam: Completed 06/28/2020 ? ? ?Interpreter Needed?: No ? ?Information entered by :: NAllen LPN ? ? ?Activities of Daily Living ? ?  05/24/2021  ? 11:57 AM 06/28/2020  ?  4:13 PM  ?In your present state of health, do you have any  difficulty performing the following activities:  ?Hearing? 1 1  ?Vision? 1 0  ?Difficulty concentrating or making decisions? 1 0  ?Walking or climbing stairs? 1 1  ?Dressing or bathing? 0 0  ?Doing er

## 2021-05-24 NOTE — Telephone Encounter (Signed)
Patient did not view mychart result note I sent: ?Richard Case, ?Your head MRI returned overall okay.  There was no signs of stroke, bleeding, mass or atrophy.  The bone near your skin cancer treatment showed some posttreatment changes which I think is overall okay. ?Let me know if interested in further evaluation for neurology referral. ?[name] ? ?Please let patient know above. ?I was waiting to hear back from him if he wanted to see neurology.  ?I have gone ahead and placed referral.  ?I think non contrasted MRI is sufficient for evaluation, but will await neurology input.  ?

## 2021-05-24 NOTE — Telephone Encounter (Signed)
Spoke with pt relaying Dr. Synthia Innocent message and agrees to [facility] referral.  Says he did see Dr. Synthia Innocent message and expresses his thanks.  ? ?Also, pt mentioned ongoing cough which is worsening.  I offered OV but states he has already discussed this with Dr. Darnell Level and requested a pulmonology referral.  Pt wants a MyChart message when this has been placed. He is aware Dr. Darnell Level is out of the office.  ?

## 2021-05-25 MED ORDER — OMEPRAZOLE 40 MG PO CPDR: 40.0000 mg | DELAYED_RELEASE_CAPSULE | Freq: Every day | ORAL | 1 refills | Status: DC

## 2021-05-25 NOTE — Telephone Encounter (Signed)
Spoke with pt asking about intranasal steroid.  Says he has Flonase but doesn't use it.  States he uses a generic loratadine which helps.  Says cough is worse in AM.  I relayed Dr. Synthia Innocent message.  Pt verbalizes understanding.  ?

## 2021-05-25 NOTE — Telephone Encounter (Signed)
Did you regularly take intranasal steroid and note any improvement? ?I would like him to take omeprazole 40 mg daily for 3 weeks and update [place] with effect on chronic cough.   ?If no improvement with either of above, we will refer to pulmonology. ?

## 2021-05-25 NOTE — Addendum Note (Signed)
Addended by: [name] on: 05/25/2021 07:35 AM ? ? Modules accepted: Orders ? ?

## 2021-06-04 ENCOUNTER — Telehealth: Payer: Self-pay

## 2021-06-04 NOTE — Telephone Encounter (Signed)
Richard Case and patient called stating recent MRI scan was done without MRI and it showed something and could not rule out that the cancer has spread to the bone. Patient and his wife want an MRI with contrast done to see more in depths and not just  "sit around waiting." I did advise that it looks like neurology referral was placed but patient and his wife said "what does neurology have to do with this when we are ruling out cancer spread." Please advise

## 2021-06-06 ENCOUNTER — Ambulatory Visit (INDEPENDENT_AMBULATORY_CARE_PROVIDER_SITE_OTHER)
Admission: RE | Admit: 2021-06-06 | Discharge: 2021-06-06 | Disposition: A | Discharge status: Home / Self Care | Payer: Medicare Other | Source: Ambulatory Visit | Attending: Family Medicine | Admitting: Family Medicine

## 2021-06-06 ENCOUNTER — Encounter: Payer: Self-pay | Admitting: Family Medicine

## 2021-06-06 ENCOUNTER — Ambulatory Visit (INDEPENDENT_AMBULATORY_CARE_PROVIDER_SITE_OTHER): Payer: Medicare Other | Admitting: Family Medicine

## 2021-06-06 VITALS — BP 128/74 | HR 80 | Temp 97.5°F | Ht 69.5 in | Wt 231.1 lb

## 2021-06-06 DIAGNOSIS — E669 Obesity, unspecified: Secondary | ICD-10-CM

## 2021-06-06 DIAGNOSIS — C4442 Squamous cell carcinoma of skin of scalp and neck: Secondary | ICD-10-CM

## 2021-06-06 DIAGNOSIS — M109 Gout, unspecified: Secondary | ICD-10-CM | POA: Diagnosis not present

## 2021-06-06 DIAGNOSIS — Z125 Encounter for screening for malignant neoplasm of prostate: Secondary | ICD-10-CM

## 2021-06-06 DIAGNOSIS — D751 Secondary polycythemia: Secondary | ICD-10-CM

## 2021-06-06 DIAGNOSIS — Z7189 Other specified counseling: Secondary | ICD-10-CM

## 2021-06-06 DIAGNOSIS — Z0000 Encounter for general adult medical examination without abnormal findings: Secondary | ICD-10-CM | POA: Diagnosis not present

## 2021-06-06 DIAGNOSIS — R413 Other amnesia: Secondary | ICD-10-CM

## 2021-06-06 DIAGNOSIS — N401 Enlarged prostate with lower urinary tract symptoms: Secondary | ICD-10-CM

## 2021-06-06 DIAGNOSIS — I6529 Occlusion and stenosis of unspecified carotid artery: Secondary | ICD-10-CM

## 2021-06-06 DIAGNOSIS — R053 Chronic cough: Secondary | ICD-10-CM

## 2021-06-06 DIAGNOSIS — E785 Hyperlipidemia, unspecified: Secondary | ICD-10-CM

## 2021-06-06 DIAGNOSIS — E1169 Type 2 diabetes mellitus with other specified complication: Secondary | ICD-10-CM

## 2021-06-06 DIAGNOSIS — E119 Type 2 diabetes mellitus without complications: Secondary | ICD-10-CM

## 2021-06-06 DIAGNOSIS — N281 Cyst of kidney, acquired: Secondary | ICD-10-CM

## 2021-06-06 DIAGNOSIS — R3912 Poor urinary stream: Secondary | ICD-10-CM

## 2021-06-06 DIAGNOSIS — G4733 Obstructive sleep apnea (adult) (pediatric): Secondary | ICD-10-CM

## 2021-06-06 LAB — HEPATIC FUNCTION PANEL
ALT: 30 U/L (ref 0–53)
AST: 25 U/L (ref 0–37)
Albumin: 4.5 g/dL (ref 3.5–5.2)
Alkaline Phosphatase: 75 U/L (ref 39–117)
Bilirubin, Direct: 0.2 mg/dL (ref 0.0–0.3)
Total Bilirubin: 1.1 mg/dL (ref 0.2–1.2)
Total Protein: 7.2 g/dL (ref 6.0–8.3)

## 2021-06-06 LAB — LIPID PANEL
Cholesterol: 142 mg/dL (ref 0–200)
HDL: 42.7 mg/dL (ref >39.00)
LDL Cholesterol: 80 mg/dL (ref 0–99)
NonHDL: 99.43
Total CHOL/HDL Ratio: 3
Triglycerides: 96 mg/dL (ref 0.0–149.0)
VLDL: 19.2 mg/dL (ref 0.0–40.0)

## 2021-06-06 LAB — PSA, MEDICARE: PSA: 1.34 ng/ml (ref 0.10–4.00)

## 2021-06-06 LAB — URIC ACID: Uric Acid, Serum: 5.5 mg/dL (ref 4.0–7.8)

## 2021-06-06 MED ORDER — METFORMIN HCL 500 MG PO TABS: 500.0000 mg | ORAL_TABLET | Freq: Every day | ORAL | 3 refills | Status: DC

## 2021-06-06 MED ORDER — ALLOPURINOL 300 MG PO TABS: 300.0000 mg | ORAL_TABLET | Freq: Every day | ORAL | 3 refills | Status: DC

## 2021-06-06 MED ORDER — ATORVASTATIN CALCIUM 20 MG PO TABS: 20.0000 mg | ORAL_TABLET | Freq: Every day | ORAL | 3 refills | Status: DC

## 2021-06-06 NOTE — Assessment & Plan Note (Signed)
Advanced directive planning - does not have this. Would want wife [name] to be HCPOA. Packet provided previously. Working through Forensic psychologist.

## 2021-06-06 NOTE — Patient Instructions (Addendum)
Labs today  Chest xray today Sign release form for your imaging from [facility]  Call [facility] to schedule appointment [phone] Schedule follow up with dermatology.  We will be in touch regarding further imaging after I touch base with radiologist.  We will refer you back to lung doctor for chronic cough.  Return in 3-4 months for follow up visit   Health Maintenance After Age 67 After age 12, you are at a higher risk for certain long-term diseases and infections as well as injuries from falls. Falls are a major cause of broken bones and head injuries in people who are older than age 18. Getting regular preventive care can help to keep you healthy and well. Preventive care includes getting regular testing and making lifestyle changes as recommended by your health care provider. Talk with your health care provider about: Which screenings and tests you should have. A screening is a test that checks for a disease when you have no symptoms. A diet and exercise plan that is right for you. What should I know about screenings and tests to prevent falls? Screening and testing are the best ways to find a health problem early. Early diagnosis and treatment give you the best chance of managing medical conditions that are common after age 30. Certain conditions and lifestyle choices may make you more likely to have a fall. Your health care provider may recommend: Regular vision checks. Poor vision and conditions such as cataracts can make you more likely to have a fall. If you wear glasses, make sure to get your prescription updated if your vision changes. Medicine review. Work with your health care provider to regularly review all of the medicines you are taking, including over-the-counter medicines. Ask your health care provider about any side effects that may make you more likely to have a fall. Tell your health care provider if any medicines that you take make you feel dizzy or  sleepy. Strength and balance checks. Your health care provider may recommend certain tests to check your strength and balance while standing, walking, or changing positions. Foot health exam. Foot pain and numbness, as well as not wearing proper footwear, can make you more likely to have a fall. Screenings, including: Osteoporosis screening. Osteoporosis is a condition that causes the bones to get weaker and break more easily. Blood pressure screening. Blood pressure changes and medicines to control blood pressure can make you feel dizzy. Depression screening. You may be more likely to have a fall if you have a fear of falling, feel depressed, or feel unable to do activities that you used to do. Alcohol use screening. Using too much alcohol can affect your balance and may make you more likely to have a fall. Follow these instructions at home: Lifestyle Do not drink alcohol if: Your health care provider tells you not to drink. If you drink alcohol: Limit how much you have to: 0-1 drink a day for women. 0-2 drinks a day for men. Know how much alcohol is in your drink. In the [place] one drink equals one 12 oz bottle of beer (355 mL), one 5 oz glass of wine (148 mL), or one 1 oz glass of hard liquor (44 mL). Do not use any products that contain nicotine or tobacco. These products include cigarettes, chewing tobacco, and vaping devices, such as e-cigarettes. If you need help quitting, ask your health care provider. Activity  Follow a regular exercise program to stay fit. This will help you maintain your balance.  Ask your health care provider what types of exercise are appropriate for you. If you need a cane or walker, use it as recommended by your health care provider. Wear supportive shoes that have nonskid soles. Safety  Remove any tripping hazards, such as rugs, cords, and clutter. Install safety equipment such as grab bars in bathrooms and safety rails on stairs. Keep rooms and walkways  well-lit. General instructions Talk with your health care provider about your risks for falling. Tell your health care provider if: You fall. Be sure to tell your health care provider about all falls, even ones that seem minor. You feel dizzy, tiredness (fatigue), or off-balance. Take over-the-counter and prescription medicines only as told by your health care provider. These include supplements. Eat a healthy diet and maintain a healthy weight. A healthy diet includes low-fat dairy products, low-fat (lean) meats, and fiber from whole grains, beans, and lots of fruits and vegetables. Stay current with your vaccines. Schedule regular health, dental, and eye exams. Summary Having a healthy lifestyle and getting preventive care can help to protect your health and wellness after age 50. Screening and testing are the best way to find a health problem early and help you avoid having a fall. Early diagnosis and treatment give you the best chance for managing medical conditions that are more common for people who are older than age 73. Falls are a major cause of broken bones and head injuries in people who are older than age 60. Take precautions to prevent a fall at home. Work with your health care provider to learn what changes you can make to improve your health and wellness and to prevent falls. This information is not intended to replace advice given to you by your health care provider. Make sure you discuss any questions you have with your health care provider. Document Revised: 05/22/2020 Document Reviewed: 05/22/2020 Elsevier Patient Education  [place].

## 2021-06-06 NOTE — Progress Notes (Addendum)
Patient ID: Richard Case, male    DOB: 1955/02/25, 66 y.o.   MRN: [id number]  This visit was conducted in person.  BP 128/74   Pulse 80   Temp (!) 97.5 F (36.4 C) (Temporal)   Ht 5' 9.5" (1.765 m)   Wt 231 lb 2 oz (104.8 kg)   SpO2 96%   BMI 33.64 kg/m    CC: CPE Subjective:   HPI: Richard Case is a 66 y.o. male presenting on 06/06/2021 for Annual Exam (MCR prt 2. )   Saw health advisor last week for initial medicare wellness visit. Note reviewed.    No results found.  Flowsheet Row Clinical Support from 05/24/2021 in [facility] [place]  PHQ-2 Total Score 0          05/24/2021   11:54 AM 11/24/2019    2:31 PM 12/04/2015    5:54 PM  Fall Risk   Falls in the past year? 0 0 No  Number falls in past yr: 0    Injury with Fall? 0    Risk for fall due to : Impaired balance/gait;Medication side effect    Follow up Falls evaluation completed;Education provided;Falls prevention discussed     See prior note for details. MRI non contrast brain overall reassuring. They remain concerned about spread of cancer and are requesting contrasted MR brain. He notes scalp changes - new bumps on skin.  Due for derm f/u (Dr Truman Hayward in [place]).  Pending neurology evaluation for memory issues.  He states he had imaging in [place] showing L carotid blockage.   Notes years of ongoing coughing fits productive of clear phlegm despite recent trial of daily omeprazole '40mg'$ . Worse cough in am and frequently throughout the day.  He's previously had allergy evaluation - positive to pine, dogs, cats, dust. He does have a cat. Last saw allergist 5-6 yrs ago. He's also seen pulmonology 10 yrs ago in [place]. Diagnosed with OSA but has had trouble tolerating CPAP (keeping it on).   Preventative: COLONOSCOPY WITH PROPOFOL 12/18/2017 - TAx2, HP, diverticulosis, rpt 5 yrs Vicente Males, Bailey Mech, MD) Prostate cancer screening - sees urology yearly Lung cancer screening - not eligible  Flu shot yearly   Pneumovax 2017, prevnar-13 11/2019 COVID vaccine Moderna 03/2019 x2, booster 06/2020. Interested in bivalent booster  Tdap 2017  Shingrix - 10/2018, 02/2019 Advanced directive planning - does not have this. Would want wife [name] to be HCPOA. Packet provided previously. Working through Forensic psychologist.  Seat belt use discussed  Sunscreen use discussed. Sees dermatology  Non smoker  Alcohol - rarely  Dentist q6 mo  Eye exam - overdue, has had trouble finding one  Bowel - no constipation Bladder - no incontinence   Lives with wife [name], 1 dog Occupation: retired - Civil engineer, contracting for [employer]  Edu: BS Activity: walking dog  Diet: good water, fruits/vegetables daily       Relevant past medical, surgical, family and social history reviewed and updated as indicated. Interim medical history since our last visit reviewed. Allergies and medications reviewed and updated. Outpatient Medications Prior to Visit  Medication Sig Dispense Refill   aspirin 81 MG chewable tablet Chew 1 tablet (81 mg total) by mouth daily.     ibuprofen (ADVIL,MOTRIN) 200 MG tablet Take 400 mg by mouth every 6 (six) hours as needed for moderate pain.     omeprazole (PRILOSEC) 40 MG capsule Take 1 capsule (40 mg total) by mouth daily. 30 min prior to large  meal, for 3 weeks then as needed 30 capsule 1   tamsulosin (FLOMAX) 0.4 MG CAPS capsule TAKE 1 CAPSULE BY MOUTH EVERY DAY 90 capsule 4   triamcinolone cream (KENALOG) 0.1 % Apply topically as needed. 30 g 0   vitamin B-12 (CYANOCOBALAMIN) 1000 MCG tablet Take 1 tablet (1,000 mcg total) by mouth daily.     allopurinol (ZYLOPRIM) 300 MG tablet TAKE 1 TABLET BY MOUTH EVERY DAY 90 tablet 0   atorvastatin (LIPITOR) 20 MG tablet TAKE 1 TABLET BY MOUTH EVERY DAY 90 tablet 2   metFORMIN (GLUCOPHAGE) 500 MG tablet TAKE 1 TABLET BY MOUTH EVERY DAY WITH BREAKFAST 90 tablet 2   No facility-administered medications prior to visit.     Per HPI unless specifically indicated in ROS  section below Review of Systems  Constitutional:  Negative for activity change, appetite change, chills, fatigue, fever and unexpected weight change.  HENT:  Negative for hearing loss.   Eyes:  Negative for visual disturbance.  Respiratory:  Positive for cough (chronic) and shortness of breath (exertional). Negative for chest tightness and wheezing.   Cardiovascular:  Negative for chest pain, palpitations and leg swelling.  Gastrointestinal:  Negative for abdominal distention, abdominal pain, blood in stool, constipation, diarrhea, nausea and vomiting.  Genitourinary:  Negative for difficulty urinating and hematuria.  Musculoskeletal:  Negative for arthralgias, myalgias and neck pain.  Skin:  Negative for rash.  Neurological:  Positive for dizziness (occ). Negative for seizures, syncope and headaches.  Hematological:  Negative for adenopathy. Does not bruise/bleed easily.  Psychiatric/Behavioral:  Negative for dysphoric mood. The patient is not nervous/anxious.    Objective:  BP 128/74   Pulse 80   Temp (!) 97.5 F (36.4 C) (Temporal)   Ht 5' 9.5" (1.765 m)   Wt 231 lb 2 oz (104.8 kg)   SpO2 96%   BMI 33.64 kg/m   Wt Readings from Last 3 Encounters:  06/06/21 231 lb 2 oz (104.8 kg)  05/24/21 235 lb (106.6 kg)  04/16/21 237 lb 6 oz (107.7 kg)      Physical Exam Vitals and nursing note reviewed.  Constitutional:      General: He is not in acute distress.    Appearance: Normal appearance. He is well-developed. He is not ill-appearing.  HENT:     Head: Normocephalic and atraumatic.     Right Ear: Hearing, tympanic membrane, ear canal and external ear normal.     Left Ear: Hearing, tympanic membrane, ear canal and external ear normal.  Eyes:     General: No scleral icterus.    Extraocular Movements: Extraocular movements intact.     Conjunctiva/sclera: Conjunctivae normal.     Pupils: Pupils are equal, round, and reactive to light.  Neck:     Thyroid: No thyroid mass or  thyromegaly.     Vascular: No carotid bruit.  Cardiovascular:     Rate and Rhythm: Normal rate and regular rhythm.     Pulses: Normal pulses.          Radial pulses are 2+ on the right side and 2+ on the left side.     Heart sounds: Normal heart sounds. No murmur heard. Pulmonary:     Effort: Pulmonary effort is normal. No respiratory distress.     Breath sounds: Normal breath sounds. No wheezing, rhonchi or rales.  Abdominal:     General: Bowel sounds are normal. There is no distension.     Palpations: Abdomen is soft. There is no mass.  Tenderness: There is no abdominal tenderness. There is no guarding or rebound.     Hernia: No hernia is present.  Musculoskeletal:        General: Normal range of motion.     Cervical back: Normal range of motion and neck supple.     Right lower leg: No edema.     Left lower leg: No edema.  Lymphadenopathy:     Cervical: No cervical adenopathy.  Skin:    General: Skin is warm and dry.     Findings: Lesion present. No rash.          Comments: New growths around area previously treated at scalp to back of head, no occipital lymphadenopathy.   Neurological:     General: No focal deficit present.     Mental Status: He is alert and oriented to person, place, and time.  Psychiatric:        Mood and Affect: Mood normal.        Behavior: Behavior normal.        Thought Content: Thought content normal.        Judgment: Judgment normal.      Results for orders placed or performed in visit on 06/06/21  Lipid panel  Result Value Ref Range   Cholesterol 142 0 - 200 mg/dL   Triglycerides 96.0 0.0 - 149.0 mg/dL   HDL 42.70 >39.00 mg/dL   VLDL 19.2 0.0 - 40.0 mg/dL   LDL Cholesterol 80 0 - 99 mg/dL   Total CHOL/HDL Ratio 3    NonHDL 99.43   Hepatic function panel  Result Value Ref Range   Total Bilirubin 1.1 0.2 - 1.2 mg/dL   Bilirubin, Direct 0.2 0.0 - 0.3 mg/dL   Alkaline Phosphatase 75 39 - 117 U/L   AST 25 0 - 37 U/L   ALT 30 0 - 53 U/L    Total Protein 7.2 6.0 - 8.3 g/dL   Albumin 4.5 3.5 - 5.2 g/dL  Uric acid  Result Value Ref Range   Uric Acid, Serum 5.5 4.0 - 7.8 mg/dL  PSA, Medicare  Result Value Ref Range   PSA 1.34 0.10 - 4.00 ng/ml   Lab Results  Component Value Date   HGBA1C 6.2 04/16/2021    Lab Results  Component Value Date   WBC 6.8 04/16/2021   HGB 16.6 04/16/2021   HCT 48.4 04/16/2021   MCV 93.0 04/16/2021   PLT 207.0 04/16/2021    DG Chest 2 View CLINICAL DATA:  Chronic cough  EXAM: CHEST - 2 VIEW  COMPARISON:  April 04, 2016  FINDINGS: The heart size and mediastinal contours are within normal limits. Both lungs are clear. The visualized skeletal structures are unremarkable.  IMPRESSION: No active cardiopulmonary disease.  Electronically Signed   By: Dorise Bullion III M.D.   On: 06/07/2021 16:35  Assessment & Plan:   Problem List Items Addressed This Visit     Health maintenance examination - Primary (Chronic)    Preventative protocols reviewed and updated unless pt declined. Discussed healthy diet and lifestyle.        Advanced directives, counseling/discussion (Chronic)    Advanced directive planning - does not have this. Would want wife [name] to be HCPOA. Packet provided previously. Working through Forensic psychologist.        Obesity, Class I, BMI 30-34.9    Encouraged healthy diet to affect sustainable weight loss       Gout    Update urate levels on allopurinol '300mg'$   daily.        Relevant Orders   Uric acid (Completed)   Controlled diabetes mellitus type II without complication (HCC)    Chronic, stable only on metformin '500mg'$  once daily - continue.        Relevant Medications   atorvastatin (LIPITOR) 20 MG tablet   metFORMIN (GLUCOPHAGE) 500 MG tablet   Hyperlipidemia associated with type 2 diabetes mellitus ([place])    Update FLP on atorvastatin '20mg'$  daily. The 10-year ASCVD risk score (Arnett DK, et al., 2019) is: 21.8%   Values used to calculate the score:      Age: 38 years     Sex: Male     Is Non-Hispanic African American: No     Diabetic: Yes     Tobacco smoker: No     Systolic Blood Pressure: 254 mmHg     Is BP treated: No     HDL Cholesterol: 42.7 mg/dL     Total Cholesterol: 142 mg/dL        Relevant Medications   atorvastatin (LIPITOR) 20 MG tablet   metFORMIN (GLUCOPHAGE) 500 MG tablet   Other Relevant Orders   Lipid panel (Completed)   Hepatic function panel (Completed)   Polycythemia    ?OSA related, seems to have stabilized       OSA (obstructive sleep apnea)    H/o this, difficulty tolerating CPAP mask.  Consider pulm eval along with chronic cough.        Relevant Orders   Ambulatory referral to Pulmonology   BPH (benign prostatic hyperplasia)    Sees urology. Update PSA.       Complex renal cyst    This is followed by urology whom he sees yearly.        Chronic cough    Chronic issue, ongoing for years, daily omeprazole 40 mg did not resolve this.  He has had positive allergy evaluation to pine pollen, dogs, cats, dust however cough is not improved with over-the-counter allergy medications.  He desires further evaluation for his chronic cough.  Will check chest x-ray today and refer to pulmonology.       Relevant Orders   DG Chest 2 View (Completed)   Ambulatory referral to Pulmonology   Memory deficit    Referral previously placed to neurology, not yet scheduled.  I provided number to neurology office for patient to call and schedule appointment.       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  Given new lesions present, I discussed urgent need to f/u with dermatology, and I will also touch base with radiology about need for further imaging to evaluate this area.   ADDENDUM ==> spoke with radiologist regarding further  imaging studies - he notes mild inflammation at skull around site of previous cancer, confirms there's no study that will definitively rule out ongoing cancer, recommends f/u with dermatology. Discussed with patient.        Relevant Medications   allopurinol (ZYLOPRIM) 300 MG tablet   Carotid stenosis    Endorses a history of left carotid stenosis on imaging done at [facility] in [place].  I will request these records to update his chart.       Relevant Medications   atorvastatin (LIPITOR) 20 MG tablet   Other Visit Diagnoses     Special  screening for malignant neoplasm of prostate       Relevant Orders   PSA, Medicare (Completed)        Meds ordered this encounter  Medications   allopurinol (ZYLOPRIM) 300 MG tablet    Sig: Take 1 tablet (300 mg total) by mouth daily.    Dispense:  90 tablet    Refill:  3   atorvastatin (LIPITOR) 20 MG tablet    Sig: Take 1 tablet (20 mg total) by mouth daily.    Dispense:  90 tablet    Refill:  3   metFORMIN (GLUCOPHAGE) 500 MG tablet    Sig: Take 1 tablet (500 mg total) by mouth daily with breakfast.    Dispense:  90 tablet    Refill:  3   Orders Placed This Encounter  Procedures   DG Chest 2 View    Standing Status:   Future    Number of Occurrences:   1    Standing Expiration Date:   06/07/2022    Order Specific Question:   Reason for Exam (SYMPTOM  OR DIAGNOSIS REQUIRED)    Answer:   chronic cough    Order Specific Question:   Preferred imaging location?    Answer:   [facility]   Lipid panel   Hepatic function panel   Uric acid   PSA, Medicare   Ambulatory referral to Pulmonology    Referral Priority:   Routine    Referral Type:   Consultation    Referral Reason:   Specialty Services Required    Requested Specialty:   Pulmonary Disease    Number of Visits Requested:   1    Patient instructions: Labs today  Chest xray today Sign release form for your imaging from [facility]   Call [facility] to schedule appointment [phone] Schedule follow up with dermatology.  We will be in touch regarding further imaging after I touch base with radiologist.  We will refer you back to lung doctor for chronic cough.  Return in 3-6 months for follow up visit   Follow up plan: Return in about 4 months (around 10/07/2021) for follow up visit.  [name], MD

## 2021-06-06 NOTE — Assessment & Plan Note (Signed)
Preventative protocols reviewed and updated unless pt declined. Discussed healthy diet and lifestyle.  

## 2021-06-06 NOTE — Telephone Encounter (Signed)
Will discuss at pt's OV today

## 2021-06-12 ENCOUNTER — Encounter: Payer: Self-pay | Admitting: Family Medicine

## 2021-06-12 DIAGNOSIS — I6529 Occlusion and stenosis of unspecified carotid artery: Secondary | ICD-10-CM | POA: Insufficient documentation

## 2021-06-12 NOTE — Assessment & Plan Note (Signed)
H/o this, difficulty tolerating CPAP mask.  Consider pulm eval along with chronic cough.

## 2021-06-12 NOTE — Assessment & Plan Note (Signed)
Endorses a history of left carotid stenosis on imaging done at [facility] in [place].  I will request these records to update his chart.

## 2021-06-12 NOTE — Assessment & Plan Note (Signed)
Sees urology. Update PSA.

## 2021-06-12 NOTE — Assessment & Plan Note (Signed)
Chronic, stable only on metformin '500mg'$  once daily - continue.

## 2021-06-12 NOTE — Assessment & Plan Note (Signed)
?  OSA related, seems to have stabilized

## 2021-06-12 NOTE — Assessment & Plan Note (Signed)
Referral previously placed to neurology, not yet scheduled.  I provided number to neurology office for patient to call and schedule appointment.

## 2021-06-12 NOTE — Assessment & Plan Note (Signed)
Chronic issue, ongoing for years, daily omeprazole 40 mg did not resolve this.  He has had positive allergy evaluation to pine pollen, dogs, cats, dust however cough is not improved with over-the-counter allergy medications.  He desires further evaluation for his chronic cough.  Will check chest x-ray today and refer to pulmonology.

## 2021-06-12 NOTE — Assessment & Plan Note (Addendum)
Recent noncontrasted brain MRI reassuring, however they remain understandably concerned given previous extensive nature of squamous cell cancer s/p multiple skin surgeries.  Given new lesions present, I discussed urgent need to f/u with dermatology, and I will also touch base with radiology about need for further imaging to evaluate this area.   ADDENDUM ==> spoke with radiologist regarding further imaging studies - he notes mild inflammation at skull around site of previous cancer, confirms there's no study that will definitively rule out ongoing cancer, recommends f/u with dermatology. Discussed with patient.

## 2021-06-12 NOTE — Assessment & Plan Note (Signed)
Update FLP on atorvastatin '20mg'$  daily. The 10-year ASCVD risk score (Arnett DK, et al., 2019) is: 21.8%   Values used to calculate the score:     Age: 66 years     Sex: Male     Is Non-Hispanic African American: No     Diabetic: Yes     Tobacco smoker: No     Systolic Blood Pressure: 300 mmHg     Is BP treated: No     HDL Cholesterol: 42.7 mg/dL     Total Cholesterol: 142 mg/dL

## 2021-06-12 NOTE — Assessment & Plan Note (Signed)
Update urate levels on allopurinol '300mg'$  daily.

## 2021-06-12 NOTE — Assessment & Plan Note (Signed)
Encouraged healthy diet to affect sustainable weight loss

## 2021-06-12 NOTE — Assessment & Plan Note (Addendum)
This is followed by urology whom he sees yearly.

## 2021-06-13 ENCOUNTER — Telehealth: Payer: Self-pay

## 2021-06-13 DIAGNOSIS — R053 Chronic cough: Secondary | ICD-10-CM

## 2021-06-13 NOTE — Telephone Encounter (Signed)
Patient called in stating that he was referred to a pulmonologist, but the diagnosis they have is for sleep apnea. Richard Case said they are wanting him to schedule a sleep test, due to this diagnosis but patient already had one done and that he is concerned about his cough. Asking if we can give [facility] A call and get this clarified.

## 2021-06-14 NOTE — Telephone Encounter (Signed)
I placed referral to lung doctors for both chronic cough and sleep apnea. I did this because sleep apnea could also contribute to cognition issues previously discussed.  He last had sleep study 2010 so it would be reasonable to renew sleep study with eval for new mask fitting.  If he just wants to see lung doctor for chronic cough, let me know and I will update referral.

## 2021-06-15 NOTE — Telephone Encounter (Signed)
Patient notified as instructed by telephone and verbalized understanding. Patient stated that he only wants the referral for the chronic cough. Patient was advised that he will hear back from one of the referral coordinators to get this set up for him.

## 2021-06-16 NOTE — Telephone Encounter (Signed)
New referral only for chronic cough placed.

## 2021-06-16 NOTE — Addendum Note (Signed)
Addended by: [name] on: 06/16/2021 11:44 AM   Modules accepted: Orders

## 2021-06-18 ENCOUNTER — Telehealth: Payer: Self-pay | Admitting: Family Medicine

## 2021-06-18 NOTE — Telephone Encounter (Signed)
Pt wife called and said the [facility] is wanting him seen asap but they need the MRI results, Fax number is [phone]. Dr Alvina Chou. Phone: [phone]. He said his memory is getting worse and he really needs this done as soon as possible. She wants a call back at [phone]

## 2021-06-18 NOTE — Telephone Encounter (Signed)
Faxed MRI results to [facility] at [phone], attn: Dr. Alvina Chou.  Spoke with pt's wife, Otho Perl (on dpr), notifying her results were faxed.  Expresses her thanks.

## 2021-06-18 NOTE — Telephone Encounter (Signed)
Printed MRI results and placed in Richard Case's box.  Plz fax to number provided and notify wife.

## 2021-06-20 ENCOUNTER — Other Ambulatory Visit: Payer: Self-pay | Admitting: Family Medicine

## 2021-08-02 NOTE — Progress Notes (Unsigned)
Cardiology Office Note    Date:  08/06/2021   ID:  Richard Case, DOB 05-15-1955, MRN [id number]  PCP:  [name], MD  Cardiologist:  Kathlyn Sacramento, MD  Electrophysiologist:  None   Chief Complaint: Follow-up  History of Present Illness:   Richard Case is a 66 y.o. 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  He was evaluated in 2019 for exertional dyspnea with treadmill MPI showing no ischemia or scar with an EF of 55 to 65%.  BP demonstrated a hypertensive response to exercise.  He was noted to have mild PVCs with exercise and in recovery.  Overall, this was a low risk study.  Echo at that time showed an EF of 50 to 55%, mild LVH, grade 1 diastolic dysfunction, normal LV internal cavity size, normal RV systolic function and cavity size, normal size and structure aortic root, and no significant valvular abnormalities.  He was last seen virtually in 05/2019, and noted improvement in his exertional dyspnea with more physical activity.  He was without symptoms of angina or decompensation.  His BP was elevated at 163/93.  Since we last saw him, he has been evaluated for increasing short-term memory loss.  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  He was evaluated by vascular surgery at [facility] with no intervention indicated.  MRI of the brain in 05/2021 showed no evidence of acute intracranial abnormality.  There is also nonspecific mild marrow signal abnormality within the calvarium, possibly related to posttreatment changes and/or artifact, though involvement of tumor was difficult to exclude.  PCP has discussed this with the patient.  He comes in doing well from a cardiac perspective and is without  symptoms of frank angina or decompensation.  He does continue to note exertional dyspnea that is more pronounced when walking up inclines.  He notes if he takes his time the symptoms are less noticeable, though with his typical baseline gait that is difficult to do at times.  The symptoms are also less noticeable when he is back in [place] and walking more on flat ground.  He is without symptoms of frank chest pain.  No dizziness, presyncope, or syncope.  No significant lower extremity swelling.  He is hopeful to continue with weight loss through heart healthy diet and regular exercise.  When compared to his last in person visit with [place] in 2019, his weight is down 11 pounds.  He has been evaluated for peripheral neuropathy.  He has no claudication symptoms.  He also requests a referral to pulmonology for his dyspnea and chronic cough.   Labs independently reviewed: 05/2021 - albumin 4.5, AST/ALT normal, TC 142, TG 96, HDL 42, LDL 80 04/2021 - A1c 6.2, TSH normal, potassium 4.5, BUN 14, serum creatinine 1.19, Hgb 16.6, PLT 207  Past Medical History:  Diagnosis Date   ADD (attention deficit disorder) 2011   no records of workup; strattera caused urinary retention, not interested in habit forming medication   Atrial ectopy 2011   improved with CPAP, documented by holter   Benign colon polyp ?2014   hyperplastic   BPH (benign prostatic hypertrophy)    per prior PCP records   Diabetes mellitus without complication ([place])  Elevated blood pressure (not hypertension)    Erectile dysfunction    GERD (gastroesophageal reflux disease)    Gout    Heart murmur longstanding   History of kidney stones 2008, 2017   History of pneumonia 2009   HLD (hyperlipidemia)    Kidney stones    HX   Male erectile dysfunction    Obesity, Class I, BMI 30-34.9    OSA (obstructive sleep apnea) 2010   OSA with stabilization at CPAP 7cm   Peyronie disease    per prior pcp records   Polycythemia    ?OSA related    Prediabetes    Rosacea    per prior pcp records   Seasonal allergic rhinitis    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    Past Surgical History:  Procedure Laterality Date   CARDIOVASCULAR STRESS TEST  07/2017   low risk stress test, hypertensive response ([place])   COLONOSCOPY  2009   rec rpt 5 yrs per prior PCP records but no actual report   COLONOSCOPY  03/2012   mild diverticulosis, rpt 5 yrs (Dr Hulan Saas in [place])   COLONOSCOPY WITH PROPOFOL N/A 12/18/2017   TAx2, HP, diverticulosis, rpt 5 yrs Vicente Males, Bailey Mech, MD)   [place] LITHOTRIPSY Right 04/06/2015   Procedure: EXTRACORPOREAL SHOCK WAVE LITHOTRIPSY (ESWL);  Surgeon: Nickie Retort, MD;  Location: [facility] ORS;  Service: Urology;  Laterality: Right;   head surgery     01/2021, squamos cell carcinoma   MOHS SURGERY  2015   SCC of nose   TONSILLECTOMY      Current Medications: Current Meds  Medication Sig   allopurinol (ZYLOPRIM) 300 MG tablet Take 1 tablet (300 mg total) by mouth daily.   aspirin 81 MG chewable tablet Chew 1 tablet (81 mg total) by mouth daily.   atorvastatin (LIPITOR) 40 MG tablet Take 1 tablet (40 mg total) by mouth daily.   ibuprofen (ADVIL,MOTRIN) 200 MG tablet Take 400 mg by mouth every 6 (six) hours as needed for moderate pain.   memantine (NAMENDA) 10 MG tablet Take by mouth daily.   metFORMIN (GLUCOPHAGE) 500 MG tablet Take 1 tablet (500 mg total) by mouth daily with breakfast.   methylphenidate (RITALIN) 10 MG tablet Take 10 mg by mouth 2 (two) times daily.   tamsulosin (FLOMAX) 0.4 MG CAPS capsule TAKE 1 CAPSULE BY MOUTH EVERY DAY   triamcinolone cream (KENALOG) 0.1 % Apply topically as needed.   vitamin B-12 (CYANOCOBALAMIN) 1000 MCG tablet Take 1 tablet (1,000 mcg total) by mouth daily.   [DISCONTINUED] atorvastatin (LIPITOR) 20 MG tablet Take 1 tablet (20 mg total) by mouth daily.   [DISCONTINUED] metoprolol tartrate (LOPRESSOR) 100 MG tablet Take 1 tablet  (100 mg total) by mouth once for 1 dose. TAKE 2 hours prior to your test.    Allergies:   Dog epithelium and Strattera [atomoxetine hcl]   Social History   Socioeconomic History   Marital status: Married    Spouse name: Not on file   Number of children: Not on file   Years of education: Not on file   Highest education level: Not on file  Occupational History   Not on file  Tobacco Use   Smoking status: Never   Smokeless tobacco: Never  Vaping Use   Vaping Use: Never used  Substance and Sexual Activity   Alcohol use: Yes    Comment: once a month   Drug use: No   Sexual  activity: Not on file  Other Topics Concern   Not on file  Social History Narrative   Lives with wife [place], 1 dog   Occupation: retired - Civil engineer, contracting for [employer]   Edu: BS   Activity: active in yard   Diet: good water, fruits/vegetables daily   Social Determinants of Health   Financial Resource Strain: [place]  (05/24/2021)   Overall Financial Resource Strain (CARDIA)    Difficulty of Paying Living Expenses: Not hard at all  Food Insecurity: No Food Insecurity (05/24/2021)   Hunger Vital Sign    Worried About Running Out of Food in the Last Year: Never true    [place] in the Last Year: Never true  Transportation Needs: No Transportation Needs (05/24/2021)   PRAPARE - Hydrologist (Medical): No    Lack of Transportation (Non-Medical): No  Physical Activity: Inactive (05/24/2021)   Exercise Vital Sign    Days of Exercise per Week: 0 days    Minutes of Exercise per Session: 0 min  Stress: No Stress Concern Present (05/24/2021)   [place]    Feeling of Stress : Not at all  Social Connections: Not on file     Family History:  The patient's family history includes CAD in his father; Cancer in his mother; Cancer (age of onset: 83) in his father; Heart Problems in his father; Lung disease in his  mother; Prostate cancer in his father. There is no history of Stroke, Diabetes, Kidney disease, Kidney cancer, or Bladder Cancer.  ROS:   12-point review of systems is negative unless otherwise noted in the HPI.   EKGs/Labs/Other Studies Reviewed:    Studies reviewed were summarized above. The additional studies were reviewed today:  2D echo 08/05/2017: - Left ventricle: The cavity size was normal. Wall thickness was    increased in a pattern of mild LVH. Systolic function was low    normal to mildly reduced. The estimated ejection fraction was in    the range of 50% to 55%. Doppler parameters are consistent with    abnormal left ventricular relaxation (grade 1 diastolic    dysfunction).  - Right ventricle: The cavity size was normal. Wall thickness was    normal. Systolic function was normal. __________  Treadmill MPI 08/05/2017: Normal exercise myocardial perfusion stress test without ischemia or scar. The left ventricular ejection fraction is normal (55-65%). Blood pressure demonstrated a hypertensive response to exercise. There was no ST segment deviation or T wave abnormalities noted during stress. Rare PVC's noted during stress and recovery. This is a low risk study.    EKG:  EKG is ordered today.  The EKG ordered today demonstrates NSR, 90 bpm, rare PAC, no acute ST-T changes  Recent Labs: 04/16/2021: BUN 14; Creatinine, Ser 1.19; Hemoglobin 16.6; Platelets 207.0; Potassium 4.5; Sodium 142; TSH 1.37 06/06/2021: ALT 30  Recent Lipid Panel    Component Value Date/Time   CHOL 142 06/06/2021 1209   CHOL 187 05/04/2013 0000   TRIG 96.0 06/06/2021 1209   TRIG 126 05/04/2013 0000   HDL 42.70 06/06/2021 1209   CHOLHDL 3 06/06/2021 1209   VLDL 19.2 06/06/2021 1209   LDLCALC 80 06/06/2021 1209   LDLCALC 123 05/04/2013 0000   LDLDIRECT 118.0 03/22/2015 0745    PHYSICAL EXAM:    VS:  BP 112/70 (BP Location: Left Arm, Patient Position: Sitting, Cuff Size: Normal)   Pulse 90  Ht '5\' 10"'$  (1.778 m)   Wt 228 lb 2 oz (103.5 kg)   SpO2 98%   BMI 32.73 kg/m   BMI: Body mass index is 32.73 kg/m.  Physical Exam Vitals reviewed.  Constitutional:      Appearance: He is well-developed.  HENT:     Head: Normocephalic and atraumatic.  Eyes:     General:        Right eye: No discharge.        Left eye: No discharge.  Cardiovascular:     Rate and Rhythm: Normal rate and regular rhythm.     Pulses:          Posterior tibial pulses are 2+ on the right side and 2+ on the left side.     Heart sounds: Normal heart sounds, S1 normal and S2 normal. Heart sounds not distant. No midsystolic click and no opening snap. No murmur heard.    No friction rub.  Pulmonary:     Effort: Pulmonary effort is normal. No respiratory distress.     Breath sounds: Normal breath sounds. No decreased breath sounds, wheezing or rales.  Chest:     Chest wall: No tenderness.  Abdominal:     General: There is no distension.  Musculoskeletal:     Cervical back: Normal range of motion.     Right lower leg: No edema.     Left lower leg: No edema.  Skin:    General: Skin is warm and dry.     Nails: There is no clubbing.  Neurological:     Mental Status: He is alert and oriented to person, place, and time.  Psychiatric:        Speech: Speech normal.        Behavior: Behavior normal.        Thought Content: Thought content normal.        Judgment: Judgment normal.     Wt Readings from Last 3 Encounters:  08/06/21 228 lb 2 oz (103.5 kg)  06/06/21 231 lb 2 oz (104.8 kg)  05/24/21 235 lb (106.6 kg)     ASSESSMENT & PLAN:   Exertional dyspnea: Concerning for possible anginal equivalent.  Schedule coronary CTA, if insurance approves for further evaluation and risk factor modification.  Previously, it has been suspected that his symptoms may be related to physical deconditioning.  Continue aggressive risk factor modification and current medical therapy including aspirin and atorvastatin.   We will also refer him to pulmonology at his request.  HLD: LDL of 80 with goal being less than 70.  Escalate atorvastatin to 40 mg daily.  Follow-up fasting lipid and liver function in 2 months.  Carotid artery disease: Prior imaging at outside hospital earlier this summer showed 50% right ICA stenosis.  He is on aspirin and atorvastatin as outlined below.  Follow-up carotid ultrasound in the summer 2024.  Chronic cough: He requests a referral to pulmonology, this will be made at his request.  Peripheral neuropathy: Symptoms are not consistent with PAD.  He has no symptoms of claudication.  Distal pulses are 2+ bilaterally.   Disposition: F/u with Dr. Fletcher Anon or an APP in 3 months.   Medication Adjustments/Labs and Tests Ordered: Current medicines are reviewed at length with the patient today.  Concerns regarding medicines are outlined above. Medication changes, Labs and Tests ordered today are summarized above and listed in the Patient Instructions accessible in Encounters.   Signed, Christell Faith, PA-C 08/06/2021 4:15 PM  [place] [place], [place] [address] [phone]

## 2021-08-06 ENCOUNTER — Telehealth: Payer: Self-pay | Admitting: Cardiovascular Disease

## 2021-08-06 ENCOUNTER — Ambulatory Visit (INDEPENDENT_AMBULATORY_CARE_PROVIDER_SITE_OTHER): Payer: Medicare Other | Admitting: Physician Assistant

## 2021-08-06 ENCOUNTER — Encounter: Payer: Self-pay | Admitting: Physician Assistant

## 2021-08-06 VITALS — BP 112/70 | HR 90 | Ht 70.0 in | Wt 228.1 lb

## 2021-08-06 DIAGNOSIS — E785 Hyperlipidemia, unspecified: Secondary | ICD-10-CM

## 2021-08-06 DIAGNOSIS — R0609 Other forms of dyspnea: Secondary | ICD-10-CM | POA: Diagnosis not present

## 2021-08-06 DIAGNOSIS — I6521 Occlusion and stenosis of right carotid artery: Secondary | ICD-10-CM | POA: Diagnosis not present

## 2021-08-06 DIAGNOSIS — G6289 Other specified polyneuropathies: Secondary | ICD-10-CM

## 2021-08-06 DIAGNOSIS — I208 Other forms of angina pectoris: Secondary | ICD-10-CM

## 2021-08-06 DIAGNOSIS — R053 Chronic cough: Secondary | ICD-10-CM

## 2021-08-06 MED ORDER — METOPROLOL TARTRATE 100 MG PO TABS: 100.0000 mg | ORAL_TABLET | Freq: Once | ORAL | 0 refills | Status: DC

## 2021-08-06 MED ORDER — ATORVASTATIN CALCIUM 40 MG PO TABS: 40.0000 mg | ORAL_TABLET | Freq: Every day | ORAL | 3 refills | Status: DC

## 2021-08-06 NOTE — Telephone Encounter (Signed)
Spoke with patients wife per release form. She had questions regarding carotid results that were done at [facility]. She states they reported blockages there. She just wanted the percentages and reviewed those with her. She states they go to [facility] at times for second opinion. She then wanted to know if that could cause memory loss. Encouraged her to ask these questions to provider that ordered that test or neurologist. She was very appreciative with no further questions at this time.

## 2021-08-06 NOTE — Patient Instructions (Addendum)
Medication Instructions:  Your physician has recommended you make the following change in your medication:   INCREASE Atorvastatin to 40 mg once daily  *If you need a refill on your cardiac medications before your next appointment, please call your pharmacy*   Lab Work: Fasting labs. Nothing to eat or drink after midnight except pills with sip of water. Lipid & Liver in 2 months over at the [place] at [facility] then go to 1st desk on the right to check in (REGISTRATION). No appointment is needed.   Lab hours: Monday- Friday (7:30 am- 5:30 pm)  If you have labs (blood work) drawn today and your tests are completely normal, you will receive your results only by: [place] (if you have MyChart) OR A paper copy in the mail If you have any lab test that is abnormal or we need to change your treatment, we will call you to review the results.   Testing/Procedures:   Your cardiac CT will be scheduled at the below location:    [facility] [address] [place], [place] [address] [phone]  Scheduled for Thursday 08/16/21 at 09:30 am please arrive early at 09:15 am Please follow these instructions carefully (unless otherwise directed):  Hold all erectile dysfunction medications at least 3 days (72 hrs) prior to test.  On the Night Before the Test: Be sure to Drink plenty of water. Do not consume any caffeinated/decaffeinated beverages or chocolate 12 hours prior to your test. Do not take any antihistamines 12 hours prior to your test.   On the Day of the Test: Drink plenty of water until 1 hour prior to the test. Do not eat any food 4 hours prior to the test. You may take your regular medications prior to the test.  Take metoprolol (Lopressor) 100 mg two hours prior to test. HOLD Furosemide/Hydrochlorothiazide morning of the test. FEMALES- please wear underwire-free bra if available, avoid dresses & tight clothing         After the Test: Drink plenty of water. After receiving IV contrast, you may experience a mild flushed feeling. This is normal. On occasion, you may experience a mild rash up to 24 hours after the test. This is not dangerous. If this occurs, you can take Benadryl 25 mg and increase your fluid intake. If you experience trouble breathing, this can be serious. If it is severe call 911 IMMEDIATELY. If it is mild, please call our office. If you take any of these medications: Glipizide/Metformin, Avandament, Glucavance, please do not take 48 hours after completing test unless otherwise instructed.   For non-scheduling related questions, please contact the cardiac imaging nurse navigator should you have any questions/concerns: Marchia Bond, Cardiac Imaging Nurse Navigator Gordy Clement, Cardiac Imaging Nurse Navigator [facility] Direct Office Dial: [phone]   For scheduling needs, including cancellations and rescheduling, please call [place], [phone].    Follow-Up: At [facility], you and your health needs are our priority.  As part of our continuing mission to provide you with exceptional heart care, we have created designated Provider Care Teams.  These Care Teams include your primary Cardiologist (physician) and Advanced Practice Providers (APPs -  Physician Assistants and Nurse Practitioners) who all work together to provide you with the care you need, when you need it.   Your next appointment:   3 month(s)  The format for your next appointment:   In Person  Provider:   Kathlyn Sacramento, MD or  Christell Faith, PA-C    Important Information About [place]

## 2021-08-06 NOTE — Telephone Encounter (Signed)
He was evaluated by vascular surgery at an outside office for carotid artery stenosis which was reported to be 50%.  Vascular surgery recommended no further intervention.  This would not cause any memory deficit.  I did recommend he have follow-up carotid imaging in 06/2022, which would be 12 months from his last imaging.  This recommendation was discussed with him at his office visit today.

## 2021-08-06 NOTE — Telephone Encounter (Signed)
Spouse would like for nurse to callback in regards to a test that pt had at [facility]. Please advise

## 2021-08-15 ENCOUNTER — Telehealth (HOSPITAL_COMMUNITY): Payer: Self-pay | Admitting: *Deleted

## 2021-08-15 NOTE — Telephone Encounter (Signed)
Reaching out to patient to offer assistance regarding upcoming cardiac imaging study; pt verbalizes understanding of appt date/time, parking situation and where to check in, pre-test NPO status and medications ordered, and verified current allergies; name and call back number provided for further questions should they arise ° °Avrey Hyser RN Navigator Cardiac Imaging °[facility] °[phone] office °[phone] cell ° °Patient to take 100mg metoprolol tartrate two hours prior to his cardiac CT scan. °

## 2021-08-16 ENCOUNTER — Ambulatory Visit
Admission: RE | Admit: 2021-08-16 | Discharge: 2021-08-16 | Disposition: A | Discharge status: Home / Self Care | Payer: Medicare Other | Source: Ambulatory Visit | Attending: Physician Assistant | Admitting: Physician Assistant

## 2021-08-16 DIAGNOSIS — R0609 Other forms of dyspnea: Secondary | ICD-10-CM

## 2021-08-16 DIAGNOSIS — I208 Other forms of angina pectoris: Secondary | ICD-10-CM

## 2021-08-16 DIAGNOSIS — I2089 Other forms of angina pectoris: Secondary | ICD-10-CM

## 2021-08-16 DIAGNOSIS — E785 Hyperlipidemia, unspecified: Secondary | ICD-10-CM | POA: Diagnosis present

## 2021-08-16 LAB — POCT I-STAT CREATININE: Creatinine, Ser: 1.1 mg/dL (ref 0.61–1.24)

## 2021-08-16 MED ORDER — IOHEXOL 350 MG/ML SOLN
100.0000 mL | Freq: Once | INTRAVENOUS | Status: AC | PRN
  Administered 2021-08-16: 100 mL via INTRAVENOUS

## 2021-08-16 MED ORDER — METOPROLOL TARTRATE 5 MG/5ML IV SOLN
5.0000 mg | Freq: Once | INTRAVENOUS | Status: AC
  Administered 2021-08-16: 5 mg via INTRAVENOUS

## 2021-08-16 MED ORDER — NITROGLYCERIN 0.4 MG SL SUBL
0.8000 mg | SUBLINGUAL_TABLET | Freq: Once | SUBLINGUAL | Status: AC
  Administered 2021-08-16: 0.8 mg via SUBLINGUAL

## 2021-08-16 NOTE — Progress Notes (Signed)
Patient tolerated procedure well. Ambulate w/o difficulty. Denies light headedness or being dizzy. Sitting in chair drinking water provided. Encouraged to drink extra water today and reasoning explained. Verbalized understanding. All questions answered. ABC intact. No further needs. Discharge from procedure area w/o issues.   °

## 2021-09-10 ENCOUNTER — Ambulatory Visit: Payer: Federal, State, Local not specified - PPO | Admitting: Family Medicine

## 2021-09-24 ENCOUNTER — Ambulatory Visit (INDEPENDENT_AMBULATORY_CARE_PROVIDER_SITE_OTHER): Payer: Medicare Other | Admitting: Family Medicine

## 2021-09-24 ENCOUNTER — Encounter: Payer: Self-pay | Admitting: Family Medicine

## 2021-09-24 VITALS — BP 128/70 | HR 90 | Temp 97.7°F | Ht 70.0 in | Wt 225.2 lb

## 2021-09-24 DIAGNOSIS — R413 Other amnesia: Secondary | ICD-10-CM | POA: Diagnosis not present

## 2021-09-24 DIAGNOSIS — C4442 Squamous cell carcinoma of skin of scalp and neck: Secondary | ICD-10-CM

## 2021-09-24 DIAGNOSIS — E119 Type 2 diabetes mellitus without complications: Secondary | ICD-10-CM

## 2021-09-24 DIAGNOSIS — I6521 Occlusion and stenosis of right carotid artery: Secondary | ICD-10-CM

## 2021-09-24 DIAGNOSIS — R053 Chronic cough: Secondary | ICD-10-CM

## 2021-09-24 DIAGNOSIS — R0609 Other forms of dyspnea: Secondary | ICD-10-CM

## 2021-09-24 LAB — POCT GLYCOSYLATED HEMOGLOBIN (HGB A1C): Hemoglobin A1C: 5.9 % — AB (ref 4.0–5.6)

## 2021-09-24 NOTE — Progress Notes (Signed)
Patient ID: Richard Case, male    DOB: Apr 21, 1955, 66 y.o.   MRN: [id number]  This visit was conducted in person.  BP 128/70   Pulse 90   Temp 97.7 F (36.5 C) (Temporal)   Ht '5\' 10"'$  (1.778 m)   Wt 225 lb 4 oz (102.2 kg)   SpO2 98%   BMI 32.32 kg/m    CC: 3 mo f/u visit  Subjective:   HPI: Richard Case is a 66 y.o. male presenting on 09/24/2021 for Follow-up (Here for 3-4 mo f/u.)   Squamous cell carcinoma of scalp [facility] - saw Dr Zigmund Daniel wound clinic at [facility] 06/2021 for concern for recurrent growth - thought sequelae of open wound recommended more aggressive moisturizer use to prevent accumulation of keratin/crusting.   Had more recent reassuring PET scan through [facility]. Seeing oncologist Dr Melven Sartorius at [facility] - overall reassuring evaluaion, no f/u planned.   Referred by [facility] to neurology - established 07/2021 (Dr Trula Ore with  [facility]). Dx periph neuropathy, started on namenda and ritalin for memory. He never was contacted by [facility] neurology (referred 05/2021).   Exertional dyspnea - saw cardiology, concern for anginal equivalent, underwent coronary CTA - score of 285 (68% for age/sex), nonobstructive calcified plaque in proximal LAD. Rec aspirin, statin titration. Also referred to pulmonology for chronic cough - appt pending.   DM - does regularly check sugars fasting - 117 this morning. Compliant with antihyperglycemic regimen which includes: metformin '500mg'$  daily. Denies low sugars or hypoglycemic symptoms. Denies paresthesias, blurry vision. Last diabetic eye exam DUE - scheduled later this month. Glucometer brand: unsure. Last foot exam: DUE. DSME: declined. Lab Results  Component Value Date   HGBA1C 5.9 (A) 09/24/2021   Diabetic Foot Exam - Simple   Simple Foot Form Diabetic Foot exam was performed with the following findings: Yes 09/24/2021  4:40 PM  Visual Inspection See  comments: Yes Sensation Testing Intact to touch and monofilament testing bilaterally: Yes Pulse Check See comments: Yes Comments 1+ DP bilaterally - diminished pedal pulses Callus formation to R>L soles    Lab Results  Component Value Date   MICROALBUR 1.5 04/27/2021         Relevant past medical, surgical, family and social history reviewed and updated as indicated. Interim medical history since our last visit reviewed. Allergies and medications reviewed and updated. Outpatient Medications Prior to Visit  Medication Sig Dispense Refill   allopurinol (ZYLOPRIM) 300 MG tablet Take 1 tablet (300 mg total) by mouth daily. 90 tablet 3   aspirin 81 MG chewable tablet Chew 1 tablet (81 mg total) by mouth daily.     atorvastatin (LIPITOR) 40 MG tablet Take 1 tablet (40 mg total) by mouth daily. 90 tablet 3   ibuprofen (ADVIL,MOTRIN) 200 MG tablet Take 400 mg by mouth every 6 (six) hours as needed for moderate pain.     memantine (NAMENDA) 10 MG tablet Take by mouth daily.     metFORMIN (GLUCOPHAGE) 500 MG tablet Take 1 tablet (500 mg total) by mouth daily with breakfast. 90 tablet 3   tamsulosin (FLOMAX) 0.4 MG CAPS capsule TAKE 1 CAPSULE BY MOUTH EVERY DAY 90 capsule 4   triamcinolone cream (KENALOG) 0.1 % Apply topically as needed. 30 g 0   vitamin B-12 (CYANOCOBALAMIN) 1000 MCG tablet Take 1 tablet (1,000 mcg total) by mouth daily.     methylphenidate (RITALIN) 10 MG tablet Take 10  mg by mouth 2 (two) times daily.     methylphenidate (RITALIN) 10 MG tablet Take 1 tablet (10 mg total) by mouth 2 (two) times daily.     metoprolol tartrate (LOPRESSOR) 100 MG tablet Take 1 tablet (100 mg total) by mouth once for 1 dose. TAKE 2 hours prior to your test. 1 tablet 0   No facility-administered medications prior to visit.     Per HPI unless specifically indicated in ROS section below Review of Systems  Objective:  BP 128/70   Pulse 90   Temp 97.7 F (36.5 C) (Temporal)   Ht '5\' 10"'$   (1.778 m)   Wt 225 lb 4 oz (102.2 kg)   SpO2 98%   BMI 32.32 kg/m   Wt Readings from Last 3 Encounters:  09/24/21 225 lb 4 oz (102.2 kg)  08/06/21 228 lb 2 oz (103.5 kg)  06/06/21 231 lb 2 oz (104.8 kg)      Physical Exam Vitals and nursing note reviewed.  Constitutional:      Appearance: Normal appearance. He is not ill-appearing.  Eyes:     Extraocular Movements: Extraocular movements intact.     Conjunctiva/sclera: Conjunctivae normal.     Pupils: Pupils are equal, round, and reactive to light.  Cardiovascular:     Rate and Rhythm: Normal rate and regular rhythm.     Pulses: Normal pulses.     Heart sounds: Normal heart sounds. No murmur heard. Pulmonary:     Effort: Pulmonary effort is normal. No respiratory distress.     Breath sounds: Normal breath sounds. No wheezing, rhonchi or rales.  Musculoskeletal:     Right lower leg: No edema.     Left lower leg: No edema.     Comments: See HPI for foot exam if done  Skin:    General: Skin is warm and dry.     Findings: No rash.  Neurological:     Mental Status: He is alert.  Psychiatric:        Mood and Affect: Mood normal.        Behavior: Behavior normal.       Results for orders placed or performed in visit on 09/24/21  POCT glycosylated hemoglobin (Hb A1C)  Result Value Ref Range   Hemoglobin A1C 5.9 (A) 4.0 - 5.6 %   HbA1c POC (<> result, manual entry)     HbA1c, POC (prediabetic range)     HbA1c, POC (controlled diabetic range)      Assessment & Plan:   Problem List Items Addressed This Visit     Controlled diabetes mellitus type II without complication ([place]) - Primary    Chronic, well controlled on daily metformin.  A1c showing great control - advised if any low sugars or hypoglycemic readings to hold metformin.       Relevant Orders   POCT glycosylated hemoglobin (Hb A1C) (Completed)   Exertional dyspnea    Reassuring cardiac eval. Pending pulmonology evaluation.       Chronic cough    Pending  pulm eval.       Memory deficit    Saw neurology in [facility] - Dr Trula Ore.  Now on ritalin and namenda.  Appreciate neuro care- will request records today.       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 rec f/u PRN with both of these clinics.  No orders of the defined types were placed in this encounter.  Orders Placed This Encounter  Procedures   POCT glycosylated hemoglobin (Hb A1C)     Patient Instructions  Sign release for latest records from neurology Dr Kirtland Bouchard is doing very well! If any low sugars <70, hold metformin.  Return as needed or in 4 months for diabetes follow up visit.   Follow up plan: Return in about 4 months (around 01/24/2022) for follow up visit.  [name], MD

## 2021-09-24 NOTE — Assessment & Plan Note (Addendum)
Reassuring cardiac eval. Pending pulmonology evaluation.

## 2021-09-24 NOTE — Assessment & Plan Note (Signed)
Saw neurology in [facility] - Dr Trula Ore.  Now on ritalin and namenda.  Appreciate neuro care- will request records today.

## 2021-09-24 NOTE — Patient Instructions (Addendum)
Sign release for latest records from neurology Dr Kirtland Bouchard is doing very well! If any low sugars <70, hold metformin.  Return as needed or in 4 months for diabetes follow up visit.

## 2021-09-24 NOTE — Assessment & Plan Note (Signed)
Pending pulm eval.

## 2021-09-24 NOTE — Assessment & Plan Note (Signed)
Saw wound clinic in f/u as well as oncology - overall reassuring evaluation including PET scan. rec f/u PRN with both of these clinics.

## 2021-09-24 NOTE — Assessment & Plan Note (Signed)
Chronic, well controlled on daily metformin.  A1c showing great control - advised if any low sugars or hypoglycemic readings to hold metformin.

## 2021-09-27 ENCOUNTER — Ambulatory Visit
Admission: RE | Admit: 2021-09-27 | Discharge: 2021-09-27 | Disposition: A | Discharge status: Home / Self Care | Payer: Medicare Other | Source: Ambulatory Visit | Attending: Emergency Medicine | Admitting: Emergency Medicine

## 2021-09-27 VITALS — BP 144/82 | HR 81 | Temp 98.3°F | Resp 18

## 2021-09-27 DIAGNOSIS — Z7984 Long term (current) use of oral hypoglycemic drugs: Secondary | ICD-10-CM | POA: Insufficient documentation

## 2021-09-27 DIAGNOSIS — E669 Obesity, unspecified: Secondary | ICD-10-CM | POA: Insufficient documentation

## 2021-09-27 DIAGNOSIS — Z20822 Contact with and (suspected) exposure to covid-19: Secondary | ICD-10-CM | POA: Diagnosis not present

## 2021-09-27 DIAGNOSIS — R059 Cough, unspecified: Secondary | ICD-10-CM | POA: Insufficient documentation

## 2021-09-27 DIAGNOSIS — J069 Acute upper respiratory infection, unspecified: Secondary | ICD-10-CM | POA: Insufficient documentation

## 2021-09-27 DIAGNOSIS — M109 Gout, unspecified: Secondary | ICD-10-CM | POA: Insufficient documentation

## 2021-09-27 DIAGNOSIS — E119 Type 2 diabetes mellitus without complications: Secondary | ICD-10-CM | POA: Insufficient documentation

## 2021-09-27 DIAGNOSIS — E785 Hyperlipidemia, unspecified: Secondary | ICD-10-CM | POA: Diagnosis not present

## 2021-09-27 DIAGNOSIS — K219 Gastro-esophageal reflux disease without esophagitis: Secondary | ICD-10-CM | POA: Insufficient documentation

## 2021-09-27 LAB — SARS CORONAVIRUS 2 (TAT 6-24 HRS): SARS Coronavirus 2: NEGATIVE

## 2021-09-27 NOTE — ED Provider Notes (Signed)
[name]    CSN: [id number] Arrival date & time: 09/27/21  1036      History   Chief Complaint Chief Complaint  Patient presents with   Cough    Sinus drainage.  Covid test? - Entered by patient    HPI Richard Case is a 66 y.o. male.  Patient presents with 2-day history of runny nose, postnasal drip, congestion, cough, loss of taste.  He requests a COVID test.  No fever, sore throat, chest pain, shortness of breath, vomiting, diarrhea, or other symptoms.  Treatment at home with zinc tablets.  His medical history includes diabetes, obesity, hyperlipidemia, kidney stones, gout, GERD, BPH.  The history is provided by the patient and medical records.    Past Medical History:  Diagnosis Date   ADD (attention deficit disorder) 2011   no records of workup; strattera caused urinary retention, not interested in habit forming medication   Atrial ectopy 2011   improved with CPAP, documented by holter   Benign colon polyp ?2014   hyperplastic   BPH (benign prostatic hypertrophy)    per prior PCP records   Diabetes mellitus without complication (HCC)    Elevated blood pressure (not hypertension)    Erectile dysfunction    GERD (gastroesophageal reflux disease)    Gout    Heart murmur longstanding   History of kidney stones 2008, 2017   History of pneumonia 2009   HLD (hyperlipidemia)    Kidney stones    HX   Male erectile dysfunction    Obesity, Class I, BMI 30-34.9    OSA (obstructive sleep apnea) 2010   OSA with stabilization at CPAP 7cm   Peyronie disease    per prior pcp records   Polycythemia    ?OSA related   Prediabetes    Rosacea    per prior pcp records   Seasonal allergic rhinitis    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   nose    Patient Active Problem List   Diagnosis Date Noted   Carotid stenosis 06/12/2021   Advanced directives, counseling/discussion 06/06/2021   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 04/17/2021    Memory deficit 04/16/2021   Chronic cough 06/28/2020   Burnout of caregiver 11/24/2019   Medicare annual wellness visit, subsequent 11/24/2019   Sensorineural hearing loss, bilateral 12/12/2018   Tinnitus 10/29/2017   Exertional dyspnea 07/23/2017   Radon exposure 04/04/2016   Complex renal cyst 06/03/2015   Hydronephrosis, right 04/19/2015   History of kidney stones 04/04/2015   Peyronie disease 04/04/2015   Health maintenance examination 03/28/2015   Acute pain of left shoulder 03/28/2015   BPH (benign prostatic hyperplasia) 03/28/2015   Hyperlipidemia associated with type 2 diabetes mellitus [facility])    Male erectile dysfunction    Polycythemia    OSA (obstructive sleep apnea)    Obesity, Class I, BMI 30-34.9    Seasonal allergic rhinitis    Gout    Controlled diabetes mellitus type II without complication [facility])     Past Surgical History:  Procedure Laterality Date   CARDIOVASCULAR STRESS TEST  07/2017   low risk stress test, hypertensive response ([place])   COLONOSCOPY  2009   rec rpt 5 yrs per prior PCP records but no actual report   COLONOSCOPY  03/2012   mild diverticulosis, rpt 5 yrs (Dr Hulan Saas in [place])   COLONOSCOPY WITH PROPOFOL N/A 12/18/2017   TAx2, HP, diverticulosis, rpt 5 yrs [name], MD)  EXTRACORPOREAL SHOCK WAVE LITHOTRIPSY Right 04/06/2015   Procedure: EXTRACORPOREAL SHOCK WAVE LITHOTRIPSY (ESWL);  Surgeon: Nickie Retort, MD;  Location: [facility] ORS;  Service: Urology;  Laterality: Right;   head surgery     01/2021, squamos cell carcinoma   MOHS SURGERY  2015   SCC of nose   TONSILLECTOMY         Home Medications    Prior to Admission medications   Medication Sig Start Date End Date Taking? Authorizing Provider  allopurinol (ZYLOPRIM) 300 MG tablet Take 1 tablet (300 mg total) by mouth daily. 06/06/21   [name], MD  aspirin 81 MG chewable tablet Chew 1 tablet (81 mg total) by mouth daily. 07/23/17   [name], MD   atorvastatin (LIPITOR) 40 MG tablet Take 1 tablet (40 mg total) by mouth daily. 08/06/21 11/04/21  Rise Mu, PA-C  ibuprofen (ADVIL,MOTRIN) 200 MG tablet Take 400 mg by mouth every 6 (six) hours as needed for moderate pain.    [provider]  memantine (NAMENDA) 10 MG tablet Take by mouth daily. 07/26/21   [provider]  metFORMIN (GLUCOPHAGE) 500 MG tablet Take 1 tablet (500 mg total) by mouth daily with breakfast. 06/06/21   [name], MD  methylphenidate (RITALIN) 10 MG tablet Take 1 tablet (10 mg total) by mouth 2 (two) times daily. 09/24/21   [name], MD  tamsulosin (FLOMAX) 0.4 MG CAPS capsule TAKE 1 CAPSULE BY MOUTH EVERY DAY 11/10/20   McGowan, Larene Beach A, PA-C  triamcinolone cream (KENALOG) 0.1 % Apply topically as needed. 11/24/19   [name], MD  vitamin B-12 (CYANOCOBALAMIN) 1000 MCG tablet Take 1 tablet (1,000 mcg total) by mouth daily. 04/16/21   [name], MD    Family History Family History  Problem Relation Age of Onset   Cancer Father 64       prostate   CAD Father        possibly?   Prostate cancer Father    Heart Problems Father    Cancer Mother        lung   Lung disease Mother    Stroke Neg Hx    Diabetes Neg Hx    Kidney disease Neg Hx    Kidney cancer Neg Hx    Bladder Cancer Neg Hx     Social History Social History   Tobacco Use   Smoking status: Never   Smokeless tobacco: Never  Vaping Use   Vaping Use: Never used  Substance Use Topics   Alcohol use: Yes    Comment: once a month   Drug use: No     Allergies   Dog epithelium and Strattera [atomoxetine hcl]   Review of Systems Review of Systems  Constitutional:  Negative for chills and fever.  HENT:  Positive for congestion, postnasal drip and rhinorrhea. Negative for ear pain and sore throat.   Respiratory:  Positive for cough. Negative for shortness of breath.   Cardiovascular:  Negative for chest pain and palpitations.   Gastrointestinal:  Negative for diarrhea and vomiting.  Skin:  Negative for color change and rash.  All other systems reviewed and are negative.    Physical Exam Triage Vital Signs ED Triage Vitals  Enc Vitals Group     BP      Pulse      Resp      Temp      Temp src      SpO2      Weight  Height      Head Circumference      Peak Flow      Pain Score      Pain Loc      Pain Edu?      Excl. in [place]?    No data found.  Updated Vital Signs BP (!) 144/82   Pulse 81   Temp 98.3 F (36.8 C)   Resp 18   SpO2 96%   Visual Acuity Right Eye Distance:   Left Eye Distance:   Bilateral Distance:    Right Eye Near:   Left Eye Near:    Bilateral Near:     Physical Exam Vitals and nursing note reviewed.  Constitutional:      General: He is not in acute distress.    Appearance: Normal appearance. He is well-developed. He is not ill-appearing.  HENT:     Right Ear: Tympanic membrane normal.     Left Ear: Tympanic membrane normal.     Nose: Nose normal.     Mouth/Throat:     Mouth: Mucous membranes are moist.     Pharynx: Oropharynx is clear.     Comments: Clear PND.  Cardiovascular:     Rate and Rhythm: Normal rate and regular rhythm.     Heart sounds: Normal heart sounds.  Pulmonary:     Effort: Pulmonary effort is normal. No respiratory distress.     Breath sounds: Normal breath sounds.  Musculoskeletal:     Cervical back: Neck supple.  Skin:    General: Skin is warm and dry.  Neurological:     Mental Status: He is alert.  Psychiatric:        Mood and Affect: Mood normal.        Behavior: Behavior normal.      UC Treatments / Results  Labs (all labs ordered are listed, but only abnormal results are displayed) Labs Reviewed  SARS CORONAVIRUS 2 (TAT 6-24 HRS)    EKG   Radiology No results found.  Procedures Procedures (including critical care time)  Medications Ordered in UC Medications - No data to display  Initial Impression /  Assessment and Plan / UC Course  I have reviewed the triage vital signs and the nursing notes.  Pertinent labs & imaging results that were available during my care of the patient were reviewed by me and considered in my medical decision making (see chart for details).   Viral URI.  Afebrile and vital signs are stable.  Patient is well-appearing and his exam is reassuring.  Lungs are clear and O2 sat is 96% on room air.  PCR COVID pending.  If COVID positive, would consider treatment with molnupiravir.  (He takes tamsulosin and atorvastatin so Paxlovid would not be indicated for this patient unless his PCP indicates that he could pause or adjust the medications.)  Discussed symptomatic treatment including Tylenol, rest, hydration.  Instructed patient to follow up with his PCP if his symptoms are not improving.  Education provided on viral URI.  He agrees to plan of care.     Final Clinical Impressions(s) / UC Diagnoses   Final diagnoses:  Viral URI     Discharge Instructions      Your COVID test is pending.  Take Tylenol as needed for fever or discomfort.  Rest and keep yourself hydrated.    Follow-up with your primary care provider if your symptoms are not improving.         ED Prescriptions   None  PDMP not reviewed this encounter.   Sharion Balloon, NP 09/27/21 1118

## 2021-09-27 NOTE — Discharge Instructions (Addendum)
Your COVID test is pending.    Take Tylenol as needed for fever or discomfort.  Rest and keep yourself hydrated.    Follow-up with your primary care provider if your symptoms are not improving.     

## 2021-09-27 NOTE — ED Triage Notes (Signed)
Patient presents to UC for cough, runny nose, loss of taste since 2 days. Concerned with covid. Taking zinc.  Denies fever.

## 2021-09-28 ENCOUNTER — Telehealth: Payer: Self-pay | Admitting: Family Medicine

## 2021-09-28 ENCOUNTER — Encounter: Payer: Self-pay | Admitting: Family Medicine

## 2021-09-28 ENCOUNTER — Ambulatory Visit (INDEPENDENT_AMBULATORY_CARE_PROVIDER_SITE_OTHER): Payer: Medicare Other | Admitting: Family Medicine

## 2021-09-28 VITALS — BP 122/70 | HR 98 | Temp 97.7°F | Ht 70.0 in | Wt 221.5 lb

## 2021-09-28 DIAGNOSIS — I6521 Occlusion and stenosis of right carotid artery: Secondary | ICD-10-CM | POA: Diagnosis not present

## 2021-09-28 DIAGNOSIS — R051 Acute cough: Secondary | ICD-10-CM

## 2021-09-28 DIAGNOSIS — R058 Other specified cough: Secondary | ICD-10-CM | POA: Insufficient documentation

## 2021-09-28 MED ORDER — BENZONATATE 100 MG PO CAPS: 100.0000 mg | ORAL_CAPSULE | Freq: Three times a day (TID) | ORAL | 0 refills | Status: DC | PRN

## 2021-09-28 MED ORDER — PREDNISONE 20 MG PO TABS: ORAL_TABLET | ORAL | 0 refills | Status: DC

## 2021-09-28 MED ORDER — ALBUTEROL SULFATE HFA 108 (90 BASE) MCG/ACT IN AERS: 2.0000 | INHALATION_SPRAY | Freq: Four times a day (QID) | RESPIRATORY_TRACT | 0 refills | Status: DC | PRN

## 2021-09-28 NOTE — Patient Instructions (Addendum)
Possible RSV - swab sent.  Try albuterol inhaler sent to pharmacy for shortness of breath or wheezing, as well as tessalon perls for cough.  Prednisone prescription printed out - fill if worsening wheezing.  If not improving with treatment, seek further urgent care.  Good to see you today.

## 2021-09-28 NOTE — Progress Notes (Signed)
Patient ID: Richard Case, male    DOB: 1956-01-05, 66 y.o.   MRN: [id number]  This visit was conducted in person.  BP 122/70   Pulse 98   Temp 97.7 F (36.5 C) (Temporal)   Ht '5\' 10"'$  (1.778 m)   Wt 221 lb 8 oz (100.5 kg)   SpO2 95%   BMI 31.78 kg/m    CC: cough Subjective:   HPI: Richard Case is a 66 y.o. male presenting on 09/28/2021 for Cough (C/o worsening cough.  Neg home COVID test today. )   3d h/o worsening productive cough, ST due to cough, PNDrainage, HA due to cough. Predominant rhinorrhea. He does note some wheezing intermittently at home. Chest > head congestion.  No fevers/chills, ear or tooth pain, abd pain, nausea/vomiting, body aches.  Treating with ibuprofen, zinc lozenges and nyquil.   W[place] sick with COVID - [place].   Has had several negative COVID tests.  Seen at [facility], dx viral URI.   Known diabetic.  H/o pneumonia 2009.  OSA on CPAP.      Relevant past medical, surgical, family and social history reviewed and updated as indicated. Interim medical history since our last visit reviewed. Allergies and medications reviewed and updated. Outpatient Medications Prior to Visit  Medication Sig Dispense Refill   allopurinol (ZYLOPRIM) 300 MG tablet Take 1 tablet (300 mg total) by mouth daily. 90 tablet 3   aspirin 81 MG chewable tablet Chew 1 tablet (81 mg total) by mouth daily.     atorvastatin (LIPITOR) 40 MG tablet Take 1 tablet (40 mg total) by mouth daily. 90 tablet 3   ibuprofen (ADVIL,MOTRIN) 200 MG tablet Take 400 mg by mouth every 6 (six) hours as needed for moderate pain.     memantine (NAMENDA) 10 MG tablet Take by mouth daily.     metFORMIN (GLUCOPHAGE) 500 MG tablet Take 1 tablet (500 mg total) by mouth daily with breakfast. 90 tablet 3   methylphenidate (RITALIN) 10 MG tablet Take 1 tablet (10 mg total) by mouth 2 (two) times daily.     tamsulosin (FLOMAX) 0.4 MG CAPS capsule TAKE 1 CAPSULE BY MOUTH EVERY  DAY 90 capsule 4   triamcinolone cream (KENALOG) 0.1 % Apply topically as needed. 30 g 0   vitamin B-12 (CYANOCOBALAMIN) 1000 MCG tablet Take 1 tablet (1,000 mcg total) by mouth daily.     No facility-administered medications prior to visit.     Per HPI unless specifically indicated in ROS section below Review of Systems  Objective:  BP 122/70   Pulse 98   Temp 97.7 F (36.5 C) (Temporal)   Ht '5\' 10"'$  (1.778 m)   Wt 221 lb 8 oz (100.5 kg)   SpO2 95%   BMI 31.78 kg/m   Wt Readings from Last 3 Encounters:  09/28/21 221 lb 8 oz (100.5 kg)  09/24/21 225 lb 4 oz (102.2 kg)  08/06/21 228 lb 2 oz (103.5 kg)      Physical Exam Vitals and nursing note reviewed.  Constitutional:      Appearance: Normal appearance. He is not ill-appearing.     Comments: Tired appearing  HENT:     Head: Normocephalic and atraumatic.     Right Ear: Hearing, tympanic membrane, ear canal and external ear normal. There is no impacted cerumen.     Left Ear: Hearing, tympanic membrane, ear canal and external ear normal. There is no impacted cerumen.  Nose: Congestion and rhinorrhea present. No mucosal edema. Rhinorrhea is clear.     Right Turbinates: Not enlarged or swollen.     Left Turbinates: Not enlarged or swollen.     Right Sinus: No maxillary sinus tenderness or frontal sinus tenderness.     Left Sinus: No maxillary sinus tenderness or frontal sinus tenderness.     Mouth/Throat:     Mouth: Mucous membranes are moist.     Pharynx: Oropharynx is clear. No oropharyngeal exudate or posterior oropharyngeal erythema.  Eyes:     Extraocular Movements: Extraocular movements intact.     Conjunctiva/sclera: Conjunctivae normal.     Pupils: Pupils are equal, round, and reactive to light.  Cardiovascular:     Rate and Rhythm: Normal rate and regular rhythm.     Pulses: Normal pulses.     Heart sounds: Normal heart sounds. No murmur heard. Pulmonary:     Effort: Pulmonary effort is normal. No  respiratory distress.     Breath sounds: Normal breath sounds. No wheezing, rhonchi or rales.     Comments: Lungs largely clear with good air movement Musculoskeletal:     Cervical back: Normal range of motion and neck supple. No rigidity.     Right lower leg: No edema.     Left lower leg: No edema.  Lymphadenopathy:     Cervical: No cervical adenopathy.  Skin:    General: Skin is warm and dry.     Findings: No rash.  Neurological:     Mental Status: He is alert.  Psychiatric:        Mood and Affect: Mood normal.        Behavior: Behavior normal.       Results for orders placed or performed during the hospital encounter of 09/27/21  SARS CORONAVIRUS 2 (TAT 6-24 HRS) Anterior Nasal Swab   Specimen: Anterior Nasal Swab  Result Value Ref Range   SARS Coronavirus 2 NEGATIVE NEGATIVE   Lab Results  Component Value Date   HGBA1C 5.9 (A) 09/24/2021    Assessment & Plan:   Problem List Items Addressed This Visit     Acute cough - Primary    Acute worsening over last 3 days with marked rhinorrhea, without fever or body aches. Has had several negative COVID tests including one today at home. Sent RSV swab. Supportive measures reviewed. Rx albuterol inh PRN, tessalon perls for cough, WASP for prednisone taper. Red flags to seek care over weekend reviewed.       Relevant Orders   RSV screen (nasopharyngeal)not at [facility]     Meds ordered this encounter  Medications   predniSONE (DELTASONE) 20 MG tablet    Sig: Take two tablets daily for 3 days followed by one tablet daily for 3 days    Dispense:  9 tablet    Refill:  0   benzonatate (TESSALON) 100 MG capsule    Sig: Take 1 capsule (100 mg total) by mouth 3 (three) times daily as needed for cough.    Dispense:  30 capsule    Refill:  0   albuterol (VENTOLIN HFA) 108 (90 Base) MCG/ACT inhaler    Sig: Inhale 2 puffs into the lungs every 6 (six) hours as needed for wheezing or shortness of breath.    Dispense:  8 g    Refill:  0    Orders Placed This Encounter  Procedures   [facility] screen (nasopharyngeal)not at [facility]    Patient Instructions  Possible RSV - swab  sent.  Try albuterol inhaler sent to pharmacy for shortness of breath or wheezing, as well as tessalon perls for cough.  Prednisone prescription printed out - fill if worsening wheezing.  If not improving with treatment, seek further urgent care.  Good to see you today.   Follow up plan: Return if symptoms worsen or fail to improve.  [name], MD

## 2021-09-28 NOTE — Telephone Encounter (Signed)
Patient called and stated his cough has gotten worse after his visit and wanted to know Dr. Darnell Level can prescrib something stronger. Call back number [phone]

## 2021-09-28 NOTE — Telephone Encounter (Signed)
Recommend rpt COVID test, call us with results.

## 2021-09-28 NOTE — Telephone Encounter (Signed)
Patient called back in and stated that he took another Covid test at home and it came back negative. He stated it must be something else going on. He stated could it be something respiratory or RSV.

## 2021-09-28 NOTE — Assessment & Plan Note (Signed)
Acute worsening over last 3 days with marked rhinorrhea, without fever or body aches. Has had several negative COVID tests including one today at home. Sent RSV swab. Supportive measures reviewed. Rx albuterol inh PRN, tessalon perls for cough, WASP for prednisone taper. Red flags to seek care over weekend reviewed.

## 2021-09-28 NOTE — Telephone Encounter (Signed)
Spoke with pt offering 4:00 OV today for ongoing respiratory sxs.  Pt agrees and is leaving home now.   Plz add add pt to Dr. Synthia Innocent schedule today.

## 2021-09-28 NOTE — Telephone Encounter (Signed)
Has had several negative COVID tests.  Can he come in today at 4pm for OV?

## 2021-09-28 NOTE — Telephone Encounter (Signed)
Patient stated hat he went to the UC yesterday because he felt real bad. Patient stated that they really did not do anything much for him.Patient stated that this wife did a home covid test that had expired but her test was positive yesterday. Patient complains of a slight fever, productive cough color clear to light brown. Patient  denies SOB but does have some wheezing at times. Patient was given ER precautions.  Patient stated that he has a terrible cough and wants to know if [name] will send him in some cough medication. [facility]

## 2021-09-28 NOTE — Telephone Encounter (Signed)
Patient stated that he is going to try and get his hands on a home test and do one. Patient stated that he has a brother that probably has one and will get him to drop it off. Patient stated that he will call back with results as soon as he gets a covid test done. Patient was advised if it is positive [name] would like to do a virtual visit today with him.

## 2021-09-29 LAB — RSV SCREEN (NASOPHARYNGEAL) NOT AT ARMC
MICRO NUMBER:: 13923924
RESULT:: NOT DETECTED
SPECIMEN QUALITY:: ADEQUATE

## 2021-10-09 ENCOUNTER — Other Ambulatory Visit: Payer: Self-pay | Admitting: *Deleted

## 2021-10-09 DIAGNOSIS — N138 Other obstructive and reflux uropathy: Secondary | ICD-10-CM

## 2021-10-10 ENCOUNTER — Ambulatory Visit
Admission: RE | Admit: 2021-10-10 | Discharge: 2021-10-10 | Disposition: A | Discharge status: Home / Self Care | Payer: Medicare Other | Source: Ambulatory Visit | Attending: Urology | Admitting: Urology

## 2021-10-10 DIAGNOSIS — N281 Cyst of kidney, acquired: Secondary | ICD-10-CM | POA: Diagnosis present

## 2021-10-11 ENCOUNTER — Ambulatory Visit: Payer: Medicare Other | Admitting: Pulmonary Disease

## 2021-10-11 ENCOUNTER — Encounter: Payer: Self-pay | Admitting: Pulmonary Disease

## 2021-10-11 ENCOUNTER — Other Ambulatory Visit
Admission: RE | Admit: 2021-10-11 | Discharge: 2021-10-11 | Disposition: A | Discharge status: Home / Self Care | Payer: Medicare Other | Source: Ambulatory Visit | Attending: Pulmonary Disease | Admitting: Pulmonary Disease

## 2021-10-11 VITALS — BP 110/80 | HR 90 | Temp 98.0°F | Ht 70.0 in | Wt 223.8 lb

## 2021-10-11 DIAGNOSIS — R0609 Other forms of dyspnea: Secondary | ICD-10-CM

## 2021-10-11 DIAGNOSIS — J310 Chronic rhinitis: Secondary | ICD-10-CM | POA: Insufficient documentation

## 2021-10-11 DIAGNOSIS — J329 Chronic sinusitis, unspecified: Secondary | ICD-10-CM

## 2021-10-11 DIAGNOSIS — R053 Chronic cough: Secondary | ICD-10-CM

## 2021-10-11 LAB — CBC WITH DIFFERENTIAL/PLATELET
Abs Immature Granulocytes: 0.05 10*3/uL (ref 0.00–0.07)
Basophils Absolute: 0.1 10*3/uL (ref 0.0–0.1)
Basophils Relative: 1 %
Eosinophils Absolute: 0.2 10*3/uL (ref 0.0–0.5)
Eosinophils Relative: 2 %
HCT: 48 % (ref 39.0–52.0)
Hemoglobin: 16 g/dL (ref 13.0–17.0)
Immature Granulocytes: 1 %
Lymphocytes Relative: 18 %
Lymphs Abs: 1.9 10*3/uL (ref 0.7–4.0)
MCH: 31.3 pg (ref 26.0–34.0)
MCHC: 33.3 g/dL (ref 30.0–36.0)
MCV: 93.9 fL (ref 80.0–100.0)
Monocytes Absolute: 1.1 10*3/uL — ABNORMAL HIGH (ref 0.1–1.0)
Monocytes Relative: 10 %
Neutro Abs: 7.5 10*3/uL (ref 1.7–7.7)
Neutrophils Relative %: 68 %
Platelets: 242 10*3/uL (ref 150–400)
RBC: 5.11 MIL/uL (ref 4.22–5.81)
RDW: 12.1 % (ref 11.5–15.5)
WBC: 10.8 10*3/uL — ABNORMAL HIGH (ref 4.0–10.5)
nRBC: 0 % (ref 0.0–0.2)

## 2021-10-11 LAB — NITRIC OXIDE: Nitric Oxide: 16

## 2021-10-11 NOTE — Progress Notes (Signed)
Subjective:    Patient ID: Richard Case, male    DOB: [birth year], 66 y.o.   MRN: [id number] Patient Care Team: Eustaquio Boyden, MD as PCP - General (Family Medicine) Iran Ouch, MD as PCP - Cardiology (Cardiology)  Chief Complaint  Patient presents with   Consult    Chronic couth. Going on for years. Clear and yellow sputum.   HPI Patient is a 66 year old lifelong never smoker who presents for urination of a cough and chest congestion present over a number of years.  He cannot quantitate how many years this has been present but believes that probably since around 2016.  He is kindly referred by Dr. Eustaquio Boyden.  He states that the cough is usually associated with sinus drainage and that it occurs "24/7".  He is not sure that anything makes it better.  He does note that exposures to dust and cats.  He does get some shortness of breath when walking a steep terrain will also trigger the cough.  He has had issues with gastroesophageal reflux in the past but had an esophageal stricture dilated and since then has not had any problems.  He has not had any chest pain.  No lower extremity edema nor calf tenderness.  Cough is productive of clear to occasionally pale yellow sputum.  No hemoptysis.  He does note that he gets significant sinus drainage into the lungs.  He has not had any fevers, chills or sweats.  He had evaluation in [place], [place] by pulmonologist but no pulmonary diagnosis was rendered this was several years back.  He was diagnosed in around 2016 with obstructive sleep apnea but could not tolerate CPAP.  He has since been relatively asymptomatic in this regard.  He has embarked on weight loss given this issue.  He has an albuterol inhaler but rarely uses it.  Does not note if this helps his cough or not.  He is a lifelong never smoker but has been exposed to secondhand smoke in his line of work previously.  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  Hobbies include gardening and woodworking, he does not woodwork on [place].  Most of the woods he uses are walnut and cherry.  He has a hot tub in the home but he does not use it.  Review of systems family history shows that his mother had eosinophilic granuloma (a form of Langerhans cells histiocytosis)   Review of Systems A 10 point review of systems was performed and it is as noted above otherwise negative.  Past Medical History:  Diagnosis Date   ADD (attention deficit disorder) 2011   no records of workup; strattera caused urinary retention, not interested in habit forming medication   Atrial ectopy 2011   improved with CPAP, documented by holter   Benign colon polyp ?2014   hyperplastic   BPH (benign prostatic hypertrophy)    per prior PCP records   Diabetes mellitus without complication (HCC)    Elevated blood pressure (not hypertension)    Erectile dysfunction    GERD (gastroesophageal reflux disease)    Gout    Heart murmur longstanding   History of kidney stones 2008, 2017   History of pneumonia 2009   HLD (hyperlipidemia)    Kidney stones    HX   Male erectile dysfunction    Obesity, Class I, BMI 30-34.9    OSA (obstructive sleep apnea) 2010  OSA with stabilization at CPAP 7cm   Peyronie disease    per prior pcp records   Polycythemia    ?OSA related   Prediabetes    Rosacea    per prior pcp records   Seasonal allergic rhinitis    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   Past Surgical History:  Procedure Laterality Date   CARDIOVASCULAR STRESS TEST  07/2017   low risk stress test, hypertensive response (End)   COLONOSCOPY  2009   rec rpt 5 yrs per prior PCP records but no actual report   COLONOSCOPY  03/2012   mild diverticulosis, rpt 5 yrs (Dr Fransisca Connors in [place])   COLONOSCOPY WITH PROPOFOL N/A 12/18/2017   TAx2, HP, diverticulosis, rpt 5 yrs [name], [name], MD)   EXTRACORPOREAL SHOCK  WAVE LITHOTRIPSY Right 04/06/2015   Procedure: EXTRACORPOREAL SHOCK WAVE LITHOTRIPSY (ESWL);  Surgeon: Hildred Laser, MD;  Location: [facility] ORS;  Service: Urology;  Laterality: Right;   head surgery     01/2021, squamos cell carcinoma   MOHS SURGERY  2015   SCC of nose   TONSILLECTOMY     Patient Active Problem List   Diagnosis Date Noted   Acute cough 09/28/2021   Carotid stenosis 06/12/2021   Advanced directives, counseling/discussion 06/06/2021   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 04/17/2021   Memory deficit 04/16/2021   Chronic cough 06/28/2020   Burnout of caregiver 11/24/2019   Medicare annual wellness visit, subsequent 11/24/2019   Sensorineural hearing loss, bilateral 12/12/2018   Tinnitus 10/29/2017   Exertional dyspnea 07/23/2017   Radon exposure 04/04/2016   Complex renal cyst 06/03/2015   Hydronephrosis, right 04/19/2015   History of kidney stones 04/04/2015   Peyronie disease 04/04/2015   Health maintenance examination 03/28/2015   Acute pain of left shoulder 03/28/2015   BPH (benign prostatic hyperplasia) 03/28/2015   Hyperlipidemia associated with type 2 diabetes mellitus [facility])    Male erectile dysfunction    Polycythemia    OSA (obstructive sleep apnea)    Obesity, Class I, BMI 30-34.9    Seasonal allergic rhinitis    Gout    Controlled diabetes mellitus type II without complication (HCC)    Family History  Problem Relation Age of Onset   Cancer Father 62       prostate   CAD Father        possibly?   Prostate cancer Father    Heart Problems Father    Cancer Mother        lung   Lung disease Mother    Stroke Neg Hx    Diabetes Neg Hx    Kidney disease Neg Hx    Kidney cancer Neg Hx    Bladder Cancer Neg Hx    Social History   Tobacco Use   Smoking status: Never   Smokeless tobacco: Never  Substance Use Topics   Alcohol use: Yes    Comment: once a month   Allergies  Allergen Reactions   Dog Epithelium Rash   Strattera  [Atomoxetine Hcl] Other (See Comments)    Urinary retention   Current Meds  Medication Sig   albuterol (VENTOLIN HFA) 108 (90 Base) MCG/ACT inhaler Inhale 2 puffs into the lungs every 6 (six) hours as needed for wheezing or shortness of breath.   allopurinol (ZYLOPRIM) 300 MG tablet Take 1 tablet (300 mg total) by mouth daily.   aspirin 81 MG chewable tablet Chew 1 tablet (81  mg total) by mouth daily.   atorvastatin (LIPITOR) 40 MG tablet Take 1 tablet (40 mg total) by mouth daily.   benzonatate (TESSALON) 100 MG capsule Take 1 capsule (100 mg total) by mouth 3 (three) times daily as needed for cough.   donepezil (ARICEPT) 10 MG tablet Take 1 tablet by mouth in the morning and at bedtime.   ibuprofen (ADVIL,MOTRIN) 200 MG tablet Take 400 mg by mouth every 6 (six) hours as needed for moderate pain.   loratadine (CLARITIN) 10 MG tablet Take 10 mg by mouth daily.   memantine (NAMENDA) 10 MG tablet Take by mouth daily.   metFORMIN (GLUCOPHAGE) 500 MG tablet Take 1 tablet (500 mg total) by mouth daily with breakfast.   methylphenidate (RITALIN) 10 MG tablet Take 1 tablet (10 mg total) by mouth 2 (two) times daily.   predniSONE (DELTASONE) 20 MG tablet Take two tablets daily for 3 days followed by one tablet daily for 3 days (Patient taking differently: Take two tablets daily for 3 days followed by one tablet daily for 3 days as needed)   tamsulosin (FLOMAX) 0.4 MG CAPS capsule TAKE 1 CAPSULE BY MOUTH EVERY DAY   triamcinolone cream (KENALOG) 0.1 % Apply topically as needed.   vitamin B-12 (CYANOCOBALAMIN) 1000 MCG tablet Take 1 tablet (1,000 mcg total) by mouth daily.   Immunization History  Administered Date(s) Administered   Influenza Inj Mdck Quad Pf 01/15/2017   Influenza, High Dose Seasonal PF 11/08/2019   Influenza,inj,Quad PF,6+ Mos 01/27/2015, 10/29/2017, 11/03/2018   Influenza-Unspecified 12/15/2015   MODERNA COVID-19 SARS-COV-2 PEDS BIVALENT BOOSTER 6Y-11Y 03/18/2019, 04/14/2019,  06/16/2020   Pneumococcal Conjugate-13 11/24/2019   Pneumococcal Polysaccharide-23 01/27/2015   Rabies, IM 08/17/2014, 08/20/2014, 08/24/2014, 09/02/2014, 09/14/2014   Tdap 03/28/2015   Zoster Recombinat (Shingrix) 11/03/2018, 02/25/2019       Objective:   Physical Exam BP 110/80 (BP Location: Left Arm, Cuff Size: Normal)   Pulse 90   Temp 98 F (36.7 C)   Ht 5\' 10"  (1.778 m)   Wt 223 lb 12.8 oz (101.5 kg)   SpO2 95%   BMI 32.11 kg/m   SpO2: 95 % O2 Device: None (Room air)  GENERAL: Well-developed, obese gentleman, no acute distress, fully ambulatory, no conversational dyspnea. HEAD: Normocephalic, atraumatic.  Scalp scar from prior squamous cell carcinoma resection well-healed. EYES: Pupils equal, round, reactive to light.  No scleral icterus.  MOUTH: Dentition intact, no thrush.  Oral mucosa moist. NECK: Supple. No thyromegaly. Trachea midline. No JVD.  No adenopathy. PULMONARY: Good air entry bilaterally.  No adventitious sounds. CARDIOVASCULAR: S1 and S2. Regular rate and rhythm.  No rubs, murmurs or gallops heard. ABDOMEN: Obese otherwise benign. MUSCULOSKELETAL: No joint deformity, no clubbing, no edema.  NEUROLOGIC: No overt focal deficit, no gait disturbance, speech is fluent. SKIN: Intact,warm,dry. PSYCH: Mood and behavior normal   Lab Results  Component Value Date   NITRICOXIDE 16 10/11/2021       Assessment & Plan:      Orders Placed This Encounter  Procedures   CBC w/Diff    Standing Status:   Future    Number of Occurrences:   1    Standing Expiration Date:   02/10/2022   Allergen Panel (27) + IGE    Standing Status:   Future    Number of Occurrences:   1    Standing Expiration Date:   02/10/2022   Nitric oxide   Pulmonary Function Test [facility] Only    Standing Status:  Future    Number of Occurrences:   1    Standing Expiration Date:   02/10/2022    Order Specific Question:   Full PFT: includes the following: basic spirometry, spirometry pre &  post bronchodilator, diffusion capacity (DLCO), lung volumes    Answer:   Full PFT    Order Specific Question:   This test can only be performed at    Answer:   [facility]   I suspect that his cough is related to upper airway cough syndrome.  Recommend taking either Claritin or Zyrtec over-the-counter at bedtime.  We have requested studies to further delve into this issue.  Will see the patient in follow-up in 3 months time he is to contact us prior to that time should any difficulties arise.  Gailen Shelter, MD Advanced Bronchoscopy [facility]    *This note was dictated using voice recognition software/Dragon.  Despite best efforts to proofread, errors can occur which can change the meaning. Any transcriptional errors that result from this process are unintentional and may not be fully corrected at the time of dictation.

## 2021-10-11 NOTE — Patient Instructions (Signed)
We are getting some blood test performed to check for allergies.  We have ordered some breathing tests that will tell us more about your airways.  I recommend you take either Claritin or Zyrtec (over-the-counter) 1 tablet daily.  We will see you in follow-up in 3 months time call sooner should any new problems arise.

## 2021-10-12 ENCOUNTER — Telehealth: Payer: Self-pay | Admitting: Family Medicine

## 2021-10-12 NOTE — Telephone Encounter (Signed)
Spoke with pt/pt's wife, Lorenda Ishihara (on dpr), notifying them the refill will need to be authorized by neuro since they prescribe it and it's a controlled med.  Also, notified them Dr. Darnell Level is out of office.  Verbalizes understanding and will try to reach out to neuro.

## 2021-10-12 NOTE — Telephone Encounter (Signed)
Caller Name: dejaun vidrio  Call back phone #: [phone]  MEDICATION(S):  methylphenidate (RITALIN) 10 MG tablet  Days of Med Remaining:   Has the patient contacted their pharmacy (YES/NO)? 1 What did pharmacy advise?   [facility] [address]   ~~~Please advise patient/caregiver to allow 2-3 business days to process RX refills.

## 2021-10-17 ENCOUNTER — Other Ambulatory Visit: Payer: Self-pay | Admitting: Family Medicine

## 2021-10-17 NOTE — Telephone Encounter (Signed)
Spoke with pt asking how he's doing.  States the cough has improved and the inhaler helps and the tessalon perles.  Pt asks to continue meds to help control cough. Also, pt states he has upcoming pulmo f/u.

## 2021-10-18 MED ORDER — BENZONATATE 100 MG PO CAPS: 100.0000 mg | ORAL_CAPSULE | Freq: Three times a day (TID) | ORAL | 0 refills | Status: DC | PRN

## 2021-10-18 NOTE — Telephone Encounter (Signed)
ERx 

## 2021-10-19 ENCOUNTER — Telehealth: Payer: Self-pay | Admitting: Pulmonary Disease

## 2021-10-19 DIAGNOSIS — R0609 Other forms of dyspnea: Secondary | ICD-10-CM

## 2021-10-19 DIAGNOSIS — R053 Chronic cough: Secondary | ICD-10-CM

## 2021-10-19 LAB — ALLERGEN PANEL (27) + IGE
Alternaria Alternata IgE: <0.1 kU/L
Aspergillus Fumigatus IgE: <0.1 kU/L
Bahia Grass IgE: <0.1 kU/L
Bermuda Grass IgE: <0.1 kU/L
Cat Dander IgE: 0.1 kU/L — AB
Cedar, Mountain IgE: <0.1 kU/L
Cladosporium Herbarum IgE: <0.1 kU/L
Cockroach, American IgE: <0.1 kU/L
D Farinae IgE: <0.1 kU/L
D Pteronyssinus IgE: <0.1 kU/L
Dog Dander IgE: 0.16 kU/L — AB
Elm, American IgE: <0.1 kU/L
IgE (Immunoglobulin E), Serum: 37 IU/mL (ref 6–495)
Johnson Grass IgE: <0.1 kU/L
Kentucky Bluegrass IgE: <0.1 kU/L
Maple/Box Elder IgE: <0.1 kU/L
Oak, White IgE: <0.1 kU/L
Penicillium Chrysogen IgE: <0.1 kU/L
Pigweed, Rough IgE: <0.1 kU/L
Plantain, English IgE: <0.1 kU/L
Ragweed, Short IgE: <0.1 kU/L
Setomelanomma Rostrat: <0.1 kU/L
Timothy Grass IgE: <0.1 kU/L

## 2021-10-19 NOTE — Telephone Encounter (Signed)
I called Richard Case at the lab and she reports that new lab orders would need to be placed. I have placed the new order for the lab and the patient is aware to come back in for repeat labs for this patient. He reports he is back in town and will go by the lab at his convenience next week. Nothing further needed.

## 2021-10-25 ENCOUNTER — Other Ambulatory Visit: Payer: Medicare Other

## 2021-10-30 NOTE — Progress Notes (Signed)
8:36 PM   Richard Case 08/04/1955 [id number]  Referring provider: [name], [place],  [place] [address]  Urological history: 1. Nephrolithiasis -stone composition of 93% calcium oxalate monohydrate, 5% calcium oxalate dihydrate and 2% calcium phosphate carbonate -no stone appreciated on RUS 10/2020  2. Complex renal cyst -RUS 10/2020 - No significant change in bilateral renal cysts allowing for differences in technique and caliper placement.  3. BPH with LU TS -PSA (05/2021) 1.34 -prostate volume 70 cc on RUS 10/2020 -I PSS 11/5 -tamsulosin 0.4 mg daily  4. Family history of prostate cancer -father with non-fatal prostate cancer  Chief Complaint  Patient presents with   Benign Prostatic Hypertrophy    HPI: Richard Case is a 66 y.o. male who presents today for yearly visit.    RUS (09/2021) benign cysts  He is not having any bothersome urinary symptoms.  Patient denies any modifying or aggravating factors.  Patient denies any gross hematuria, dysuria or suprapubic/flank pain.  Patient denies any fevers, chills, nausea or vomiting.     He is having issues with retrograde ejaculation and also some worsening erectile dysfunction.  He is wondering if his testosterone level is dropping.  Patient still having spontaneous erections.  He denies any pain with erections.  He does have a curve in his erections.   IPSS     Row Name 10/31/21 1400         International Prostate Symptom Score   How often have you had the sensation of not emptying your bladder? Less than 1 in 5     How often have you had to urinate less than every two hours? Less than 1 in 5 times     How often have you found you stopped and started again several times when you urinated? Less than half the time     How often have you found it difficult to postpone urination? More than half the time     How often have you had a weak urinary stream? Less than 1 in 5 times      How often have you had to strain to start urination? Less than 1 in 5 times     How many times did you typically get up at night to urinate? 1 Time     Total IPSS Score 11       Quality of Life due to urinary symptoms   If you were to spend the rest of your life with your urinary condition just the way it is now how would you feel about that? Mostly Disatisfied                Score:  1-7 Mild 8-19 Moderate 20-35 Severe  PMH: Past Medical History:  Diagnosis Date   ADD (attention deficit disorder) 2011   no records of workup; strattera caused urinary retention, not interested in habit forming medication   Atrial ectopy 2011   improved with CPAP, documented by holter   Benign colon polyp ?2014   hyperplastic   BPH (benign prostatic hypertrophy)    per prior PCP records   Diabetes mellitus without complication (HCC)    Elevated blood pressure (not hypertension)    Erectile dysfunction    GERD (gastroesophageal reflux disease)    Gout    Heart murmur longstanding   History of kidney stones 2008, 2017   History of pneumonia 2009   HLD (hyperlipidemia)    Kidney stones  HX   Male erectile dysfunction    Obesity, Class I, BMI 30-34.9    OSA (obstructive sleep apnea) 2010   OSA with stabilization at CPAP 7cm   Peyronie disease    per prior pcp records   Polycythemia    ?OSA related   Prediabetes    Rosacea    per prior pcp records   Seasonal allergic rhinitis    Squamous cell cancer of buccal mucosa ([place]) 2015   nose    Surgical History: Past Surgical History:  Procedure Laterality Date   CARDIOVASCULAR STRESS TEST  07/2017   low risk stress test, hypertensive response (End)   COLONOSCOPY  2009   rec rpt 5 yrs per prior PCP records but no actual report   COLONOSCOPY  03/2012   mild diverticulosis, rpt 5 yrs (Dr Hulan Saas in [place])   COLONOSCOPY WITH PROPOFOL N/A 12/18/2017   TAx2, HP, diverticulosis, rpt 5 yrs Vicente Males, Bailey Mech, MD)   [place] LITHOTRIPSY Right 04/06/2015   Procedure: EXTRACORPOREAL SHOCK WAVE LITHOTRIPSY (ESWL);  Surgeon: Nickie Retort, MD;  Location: [facility] ORS;  Service: Urology;  Laterality: Right;   head surgery     01/2021, squamos cell carcinoma   MOHS SURGERY  2015   SCC of nose   TONSILLECTOMY      Home Medications:  Allergies as of 10/31/2021       Reactions   Dog Epithelium Rash   Strattera [atomoxetine Hcl] Other (See Comments)   Urinary retention        Medication List        Accurate as of October 31, 2021  8:36 PM. If you have any questions, ask your nurse or doctor.          albuterol 108 (90 Base) MCG/ACT inhaler Commonly known as: VENTOLIN HFA TAKE 2 PUFFS BY MOUTH EVERY 6 HOURS AS NEEDED FOR WHEEZE OR SHORTNESS OF BREATH   allopurinol 300 MG tablet Commonly known as: ZYLOPRIM Take 1 tablet (300 mg total) by mouth daily.   aspirin 81 MG chewable tablet Chew 1 tablet (81 mg total) by mouth daily.   atorvastatin 40 MG tablet Commonly known as: LIPITOR Take 1 tablet (40 mg total) by mouth daily.   benzonatate 100 MG capsule Commonly known as: TESSALON Take 1 capsule (100 mg total) by mouth 3 (three) times daily as needed for cough.   cyanocobalamin 1000 MCG tablet Commonly known as: VITAMIN B12 Take 1 tablet (1,000 mcg total) by mouth daily.   donepezil 10 MG tablet Commonly known as: ARICEPT Take 1 tablet by mouth in the morning and at bedtime.   ibuprofen 200 MG tablet Commonly known as: ADVIL Take 400 mg by mouth every 6 (six) hours as needed for moderate pain.   loratadine 10 MG tablet Commonly known as: CLARITIN Take 10 mg by mouth daily.   memantine 10 MG tablet Commonly known as: NAMENDA Take by mouth daily.   metFORMIN 500 MG tablet Commonly known as: GLUCOPHAGE Take 1 tablet (500 mg total) by mouth daily with breakfast.   methylphenidate 10 MG tablet Commonly known as: RITALIN Take 1 tablet (10 mg total) by mouth 2 (two) times  daily.   predniSONE 20 MG tablet Commonly known as: DELTASONE Take two tablets daily for 3 days followed by one tablet daily for 3 days What changed: additional instructions   tamsulosin 0.4 MG Caps capsule Commonly known as: FLOMAX TAKE 1 CAPSULE BY MOUTH EVERY DAY   triamcinolone cream 0.1 %  Commonly known as: KENALOG Apply topically as needed.        Allergies:  Allergies  Allergen Reactions   Dog Epithelium Rash   Strattera [Atomoxetine Hcl] Other (See Comments)    Urinary retention    Family History: Family History  Problem Relation Age of Onset   Cancer Father 58       prostate   CAD Father        possibly?   Prostate cancer Father    Heart Problems Father    Cancer Mother        lung   Lung disease Mother    Stroke Neg Hx    Diabetes Neg Hx    Kidney disease Neg Hx    Kidney cancer Neg Hx    Bladder Cancer Neg Hx     Social History:  reports that he has never smoked. He has never used smokeless tobacco. He reports current alcohol use. He reports that he does not use drugs.  ROS: For pertinent review of systems please refer to history of present illness  Physical Exam: BP (!) 158/84   Pulse 96   Ht '5\' 10"'$  (1.778 m)   Wt 224 lb (101.6 kg)   BMI 32.14 kg/m   Constitutional:  Well nourished. Alert and oriented, No acute distress. HEENT: [place] AT, mask in place.  Trachea midline Cardiovascular: No clubbing, cyanosis, or edema. Respiratory: Normal respiratory effort, no increased work of breathing. GU: No CVA tenderness.  No bladder fullness or masses.  Patient with circumcised phallus.  Urethral meatus is patent.  No penile discharge. No penile lesions or rashes. Scrotum without lesions, cysts, rashes and/or edema.  Testicles are located scrotally bilaterally. No masses are appreciated in the testicles. Left and right epididymis are normal. Rectal: Patient with  normal sphincter tone. Anus and perineum without scarring or rashes. No rectal masses are  appreciated. Prostate is approximately 50 grams, no nodules are appreciated. Seminal vesicles could not be palpated Neurologic: Grossly intact, no focal deficits, moving all 4 extremities. Psychiatric: Normal mood and affect.   Laboratory Data: Lab Results  Component Value Date   WBC 10.8 (H) 10/11/2021   HGB 16.0 10/11/2021   HCT 48.0 10/11/2021   MCV 93.9 10/11/2021   PLT 242 10/11/2021   Lab Results  Component Value Date   CREATININE 1.10 08/16/2021   Lab Results  Component Value Date   PSA 1.34 06/06/2021   PSA 1.04 04/01/2016   PSA 1.16 03/22/2015   Lab Results  Component Value Date   HGBA1C 5.9 (A) 09/24/2021  I have reviewed the labs  Pertinent Imaging: CLINICAL DATA:  Bilateral renal cysts   EXAM: RENAL / URINARY TRACT ULTRASOUND COMPLETE   COMPARISON:  October 24, 2020   FINDINGS: Right Kidney:   Renal measurements: 11.9 x 5.6 x 5.2 cm = volume: 182 mL. Contains a 1.7 cm cyst. No follow-up imaging recommended for the cyst.   Left Kidney:   Renal measurements: 12.3 x 4.9 x 4.8 cm = volume: 151 mL. Contains cysts with the largest measuring 2.7 cm. No follow-up imaging recommended for the cysts.   Bladder:   Appears normal for degree of bladder distention.   Other:   None.   IMPRESSION: No significant abnormalities. No follow-up recommended based on today's study.     Electronically Signed   By: Dorise Bullion III M.D.   On: 10/10/2021 18:23 I have independently reviewed the films.  See HPI.   Assessment & Plan:  1. BPH with LUTS -PSA stable  -DRE benign -continue conservative management, avoiding bladder irritants and timed voiding's -Continue tamsulosin 0.4 mg daily -Discussed changing the tamsulosin to tadalafil as he is having issues with retrograde ejaculation, he deferred for now  2. Minimally complex right renal cyst -determined to be benign -no more follow up warranted -he would like to keep an eye on the cyst and would  like an ultrasound in 2 years  3. ED -Testosterone is pending -Having issues with retrograde ejaculation and may consider switching to tadalafil in the future in place of the tamsulosin  Return for pending testosterone result .  These notes generated with voice recognition software. I apologize for typographical errors.  [place], [place] [address] [place], [place] [address] [phone]

## 2021-10-31 ENCOUNTER — Other Ambulatory Visit
Admission: RE | Admit: 2021-10-31 | Disposition: A | Discharge status: Home / Self Care | Payer: Medicare Other | Source: Home / Self Care | Attending: Pulmonary Disease | Admitting: Pulmonary Disease

## 2021-10-31 ENCOUNTER — Ambulatory Visit (INDEPENDENT_AMBULATORY_CARE_PROVIDER_SITE_OTHER): Payer: Medicare Other | Admitting: Urology

## 2021-10-31 ENCOUNTER — Encounter: Payer: Self-pay | Admitting: Urology

## 2021-10-31 VITALS — BP 158/84 | HR 96 | Ht 70.0 in | Wt 224.0 lb

## 2021-10-31 DIAGNOSIS — N5314 Retrograde ejaculation: Secondary | ICD-10-CM | POA: Diagnosis not present

## 2021-10-31 DIAGNOSIS — I6521 Occlusion and stenosis of right carotid artery: Secondary | ICD-10-CM

## 2021-10-31 DIAGNOSIS — N281 Cyst of kidney, acquired: Secondary | ICD-10-CM

## 2021-10-31 DIAGNOSIS — R0609 Other forms of dyspnea: Secondary | ICD-10-CM

## 2021-10-31 DIAGNOSIS — N401 Enlarged prostate with lower urinary tract symptoms: Secondary | ICD-10-CM | POA: Diagnosis not present

## 2021-10-31 DIAGNOSIS — R053 Chronic cough: Secondary | ICD-10-CM

## 2021-10-31 DIAGNOSIS — N138 Other obstructive and reflux uropathy: Secondary | ICD-10-CM | POA: Diagnosis not present

## 2021-10-31 DIAGNOSIS — N529 Male erectile dysfunction, unspecified: Secondary | ICD-10-CM | POA: Diagnosis not present

## 2021-10-31 LAB — URINALYSIS, COMPLETE
Bilirubin, UA: NEGATIVE
Glucose, UA: NEGATIVE
Ketones, UA: NEGATIVE
Leukocytes,UA: NEGATIVE
Nitrite, UA: NEGATIVE
RBC, UA: NEGATIVE
Specific Gravity, UA: 1.025 (ref 1.005–1.030)
Urobilinogen, Ur: 0.2 mg/dL (ref 0.2–1.0)
pH, UA: 5.5 (ref 5.0–7.5)

## 2021-10-31 LAB — MICROSCOPIC EXAMINATION: Bacteria, UA: NONE SEEN

## 2021-10-31 NOTE — Addendum Note (Signed)
Addended by: Antonieta Iba C on: 10/31/2021 04:03 PM   Modules accepted: Orders

## 2021-11-01 LAB — TESTOSTERONE: Testosterone: 394 ng/dL (ref 264–916)

## 2021-11-02 LAB — ALLERGEN PANEL (27) + IGE
Alternaria Alternata IgE: <0.1 kU/L
Aspergillus Fumigatus IgE: <0.1 kU/L
Bahia Grass IgE: <0.1 kU/L
Bermuda Grass IgE: <0.1 kU/L
Cat Dander IgE: <0.1 kU/L
Cedar, Mountain IgE: <0.1 kU/L
Cladosporium Herbarum IgE: <0.1 kU/L
Cocklebur IgE: <0.1 kU/L
Cockroach, American IgE: <0.1 kU/L
Common Silver Birch IgE: <0.1 kU/L
D Farinae IgE: <0.1 kU/L
D Pteronyssinus IgE: <0.1 kU/L
Dog Dander IgE: 0.3 kU/L — AB
Elm, American IgE: <0.1 kU/L
Hickory, White IgE: <0.1 kU/L
IgE (Immunoglobulin E), Serum: 30 IU/mL (ref 6–495)
Johnson Grass IgE: <0.1 kU/L
Kentucky Bluegrass IgE: <0.1 kU/L
Maple/Box Elder IgE: <0.1 kU/L
Mucor Racemosus IgE: <0.1 kU/L
Oak, White IgE: <0.1 kU/L
Penicillium Chrysogen IgE: <0.1 kU/L
Pigweed, Rough IgE: <0.1 kU/L
Plantain, English IgE: <0.1 kU/L
Ragweed, Short IgE: <0.1 kU/L
Setomelanomma Rostrat: <0.1 kU/L
Timothy Grass IgE: <0.1 kU/L
White Mulberry IgE: <0.1 kU/L

## 2021-11-08 ENCOUNTER — Encounter: Payer: Self-pay | Admitting: Pulmonary Disease

## 2021-11-09 ENCOUNTER — Ambulatory Visit: Payer: Medicare Other | Admitting: Physician Assistant

## 2021-11-09 ENCOUNTER — Telehealth: Payer: Self-pay | Admitting: Urology

## 2021-11-09 NOTE — Telephone Encounter (Signed)
Patient requested that tadalafil be called in to [facility] in [place]. He said this was suggested as a replacement for tamsulosin.

## 2021-11-09 NOTE — Telephone Encounter (Signed)
I have spoke with Mr. Langston and his PFT has been scheduled on 11/27/21 @ 2:00pm [facility]

## 2021-11-09 NOTE — Telephone Encounter (Signed)
Richard Case, please advise on PFT. Thanks

## 2021-11-12 ENCOUNTER — Other Ambulatory Visit: Payer: Self-pay | Admitting: Urology

## 2021-11-12 DIAGNOSIS — N401 Enlarged prostate with lower urinary tract symptoms: Secondary | ICD-10-CM

## 2021-11-12 MED ORDER — TADALAFIL 5 MG PO TABS: ORAL_TABLET | ORAL | 3 refills | Status: DC

## 2021-11-12 NOTE — Progress Notes (Unsigned)
I have sent in the tadalafil 5 mg daily to the [facility] here in [place].

## 2021-11-19 NOTE — Progress Notes (Unsigned)
4:56 PM   [name] 11/20/1955 [id number]  Referring provider: [name], MD [address] [place],  [place] [address]  Urological history: 1. Nephrolithiasis -stone composition of 93% calcium oxalate monohydrate, 5% calcium oxalate dihydrate and 2% calcium phosphate carbonate -no stone appreciated on RUS 10/2020  2. Complex renal cyst -RUS 10/2020 - No significant change in bilateral renal cysts allowing for differences in technique and caliper placement.  3. BPH with LU TS -PSA (05/2021) 1.34 -prostate volume 70 cc on RUS 10/2020 -I PSS 11/5 -tamsulosin 0.4 mg daily  4. Family history of prostate cancer -father with non-fatal prostate cancer  No chief complaint on file.   HPI: Richard Case is a 66 y.o. male who presents today to discuss lab results.  At his visit on 10/31/2021, his RUS (09/2021) benign cysts.  He was not having any bothersome urinary symptoms.  He was having issues with retrograde ejaculation and also some worsening erectile dysfunction.  He was wondering if his testosterone level is dropping.  Patient still having spontaneous erections.  He denied any pain with erections.  He does have a curve in his erections.  Urinalysis demonstrated SG 1.025, pH 5.5, yellow clear, 1+ protein w/ numerous sperm seen.  His afternoon testosterone was 394.    He switched from the tamsulosin to the tadalafil as well to hopefully address the retrograde ejaculation.    PMH: Past Medical History:  Diagnosis Date   ADD (attention deficit disorder) 2011   no records of workup; strattera caused urinary retention, not interested in habit forming medication   Atrial ectopy 2011   improved with CPAP, documented by holter   Benign colon polyp ?2014   hyperplastic   BPH (benign prostatic hypertrophy)    per prior PCP records   Diabetes mellitus without complication (HCC)    Elevated blood pressure (not hypertension)    Erectile dysfunction    GERD  (gastroesophageal reflux disease)    Gout    Heart murmur longstanding   History of kidney stones 2008, 2017   History of pneumonia 2009   HLD (hyperlipidemia)    Kidney stones    HX   Male erectile dysfunction    Obesity, Class I, BMI 30-34.9    OSA (obstructive sleep apnea) 2010   OSA with stabilization at CPAP 7cm   Peyronie disease    per prior pcp records   Polycythemia    ?OSA related   Prediabetes    Rosacea    per prior pcp records   Seasonal allergic rhinitis    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    Surgical History: Past Surgical History:  Procedure Laterality Date   CARDIOVASCULAR STRESS TEST  07/2017   low risk stress test, hypertensive response (End)   COLONOSCOPY  2009   rec rpt 5 yrs per prior PCP records but no actual report   COLONOSCOPY  03/2012   mild diverticulosis, rpt 5 yrs (Dr Hulan Saas in [place])   COLONOSCOPY WITH PROPOFOL N/A 12/18/2017   TAx2, HP, diverticulosis, rpt 5 yrs Vicente Males, Bailey Mech, MD)   [place] LITHOTRIPSY Right 04/06/2015   Procedure: EXTRACORPOREAL SHOCK WAVE LITHOTRIPSY (ESWL);  Surgeon: Nickie Retort, MD;  Location: [facility] ORS;  Service: Urology;  Laterality: Right;   head surgery     01/2021, squamos cell carcinoma   MOHS SURGERY  2015   SCC of nose   TONSILLECTOMY      Home Medications:  Allergies as of 11/20/2021       Reactions   Dog Epithelium Rash   Strattera [atomoxetine Hcl] Other (See Comments)   Urinary retention        Medication List        Accurate as of November 19, 2021  4:56 PM. If you have any questions, ask your nurse or doctor.          albuterol 108 (90 Base) MCG/ACT inhaler Commonly known as: VENTOLIN HFA TAKE 2 PUFFS BY MOUTH EVERY 6 HOURS AS NEEDED FOR WHEEZE OR SHORTNESS OF BREATH   allopurinol 300 MG tablet Commonly known as: ZYLOPRIM Take 1 tablet (300 mg total) by mouth daily.   aspirin 81 MG chewable tablet Chew 1 tablet (81 mg total) by  mouth daily.   atorvastatin 40 MG tablet Commonly known as: LIPITOR Take 1 tablet (40 mg total) by mouth daily.   benzonatate 100 MG capsule Commonly known as: TESSALON Take 1 capsule (100 mg total) by mouth 3 (three) times daily as needed for cough.   cyanocobalamin 1000 MCG tablet Commonly known as: VITAMIN B12 Take 1 tablet (1,000 mcg total) by mouth daily.   donepezil 10 MG tablet Commonly known as: ARICEPT Take 1 tablet by mouth in the morning and at bedtime.   ibuprofen 200 MG tablet Commonly known as: ADVIL Take 400 mg by mouth every 6 (six) hours as needed for moderate pain.   loratadine 10 MG tablet Commonly known as: CLARITIN Take 10 mg by mouth daily.   memantine 10 MG tablet Commonly known as: NAMENDA Take by mouth daily.   metFORMIN 500 MG tablet Commonly known as: GLUCOPHAGE Take 1 tablet (500 mg total) by mouth daily with breakfast.   methylphenidate 10 MG tablet Commonly known as: RITALIN Take 1 tablet (10 mg total) by mouth 2 (two) times daily.   predniSONE 20 MG tablet Commonly known as: DELTASONE Take two tablets daily for 3 days followed by one tablet daily for 3 days What changed: additional instructions   tadalafil 5 MG tablet Commonly known as: CIALIS Take one daily for BPH   tamsulosin 0.4 MG Caps capsule Commonly known as: FLOMAX TAKE 1 CAPSULE BY MOUTH EVERY DAY   triamcinolone cream 0.1 % Commonly known as: KENALOG Apply topically as needed.        Allergies:  Allergies  Allergen Reactions   Dog Epithelium Rash   Strattera [Atomoxetine Hcl] Other (See Comments)    Urinary retention    Family History: Family History  Problem Relation Age of Onset   Cancer Father 36       prostate   CAD Father        possibly?   Prostate cancer Father    Heart Problems Father    Cancer Mother        lung   Lung disease Mother    Stroke Neg Hx    Diabetes Neg Hx    Kidney disease Neg Hx    Kidney cancer Neg Hx    Bladder Cancer  Neg Hx     Social History:  reports that he has never smoked. He has never used smokeless tobacco. He reports current alcohol use. He reports that he does not use drugs.  ROS: For pertinent review of systems please refer to history of present illness  Physical Exam: There were no vitals taken for this visit.  Constitutional:  Well nourished. Alert and oriented, No acute distress. HEENT: [place] AT, moist mucus membranes.  Trachea midline Cardiovascular: No clubbing, cyanosis, or edema. Respiratory: Normal respiratory effort, no increased work of breathing. Neurologic: Grossly intact, no focal deficits, moving all 4 extremities. Psychiatric: Normal mood and affect.   Laboratory Data: N/A  Pertinent Imaging: N/A  Assessment & Plan:    1. BPH with LUTS -PSA stable  -DRE benign -continue conservative management, avoiding bladder irritants and timed voiding's -Continue tamsulosin 0.4 mg daily -Discussed changing the tamsulosin to tadalafil as he is having issues with retrograde ejaculation, he deferred for now  2. Minimally complex right renal cyst -determined to be benign -no more follow up warranted -he would like to keep an eye on the cyst and would like an ultrasound in 2 years  3. ED -Testosterone is pending -Having issues with retrograde ejaculation and may consider switching to tadalafil in the future in place of the tamsulosin  No follow-ups on file.  These notes generated with voice recognition software. I apologize for typographical errors.  [place], [place] [address] [place], [place] [address] [phone]

## 2021-11-20 ENCOUNTER — Ambulatory Visit (INDEPENDENT_AMBULATORY_CARE_PROVIDER_SITE_OTHER): Payer: Medicare Other | Admitting: Urology

## 2021-11-20 ENCOUNTER — Encounter: Payer: Self-pay | Admitting: Urology

## 2021-11-20 VITALS — BP 160/79 | HR 100 | Ht 70.0 in | Wt 215.0 lb

## 2021-11-20 DIAGNOSIS — I6521 Occlusion and stenosis of right carotid artery: Secondary | ICD-10-CM | POA: Diagnosis not present

## 2021-11-20 DIAGNOSIS — R3912 Poor urinary stream: Secondary | ICD-10-CM

## 2021-11-20 DIAGNOSIS — N5314 Retrograde ejaculation: Secondary | ICD-10-CM | POA: Diagnosis not present

## 2021-11-20 DIAGNOSIS — N281 Cyst of kidney, acquired: Secondary | ICD-10-CM

## 2021-11-20 DIAGNOSIS — N529 Male erectile dysfunction, unspecified: Secondary | ICD-10-CM | POA: Diagnosis not present

## 2021-11-20 DIAGNOSIS — N138 Other obstructive and reflux uropathy: Secondary | ICD-10-CM

## 2021-11-20 DIAGNOSIS — N401 Enlarged prostate with lower urinary tract symptoms: Secondary | ICD-10-CM | POA: Diagnosis not present

## 2021-11-20 LAB — URINALYSIS, COMPLETE
Bilirubin, UA: NEGATIVE
Glucose, UA: NEGATIVE
Ketones, UA: NEGATIVE
Leukocytes,UA: NEGATIVE
Nitrite, UA: NEGATIVE
RBC, UA: NEGATIVE
Specific Gravity, UA: 1.03 (ref 1.005–1.030)
Urobilinogen, Ur: 0.2 mg/dL (ref 0.2–1.0)
pH, UA: 5 (ref 5.0–7.5)

## 2021-11-20 LAB — MICROSCOPIC EXAMINATION: Bacteria, UA: NONE SEEN

## 2021-11-20 MED ORDER — TAMSULOSIN HCL 0.4 MG PO CAPS: 0.4000 mg | ORAL_CAPSULE | Freq: Every day | ORAL | 3 refills | Status: DC

## 2021-11-27 ENCOUNTER — Other Ambulatory Visit
Admission: RE | Admit: 2021-11-27 | Discharge: 2021-11-27 | Disposition: A | Discharge status: Home / Self Care | Payer: Medicare Other | Attending: Physician Assistant | Admitting: Physician Assistant

## 2021-11-27 ENCOUNTER — Ambulatory Visit (INDEPENDENT_AMBULATORY_CARE_PROVIDER_SITE_OTHER): Payer: Medicare Other

## 2021-11-27 DIAGNOSIS — E785 Hyperlipidemia, unspecified: Secondary | ICD-10-CM | POA: Diagnosis present

## 2021-11-27 DIAGNOSIS — R053 Chronic cough: Secondary | ICD-10-CM

## 2021-11-27 DIAGNOSIS — R0609 Other forms of dyspnea: Secondary | ICD-10-CM | POA: Diagnosis not present

## 2021-11-27 LAB — HEPATIC FUNCTION PANEL
ALT: 21 U/L (ref 0–44)
AST: 26 U/L (ref 15–41)
Albumin: 4 g/dL (ref 3.5–5.0)
Alkaline Phosphatase: 71 U/L (ref 38–126)
Bilirubin, Direct: 0.2 mg/dL (ref 0.0–0.2)
Indirect Bilirubin: 0.7 mg/dL (ref 0.3–0.9)
Total Bilirubin: 0.9 mg/dL (ref 0.3–1.2)
Total Protein: 7.3 g/dL (ref 6.5–8.1)

## 2021-11-27 LAB — LIPID PANEL
Cholesterol: 118 mg/dL (ref 0–200)
HDL: 39 mg/dL — ABNORMAL LOW (ref >40)
LDL Cholesterol: 58 mg/dL (ref 0–99)
Total CHOL/HDL Ratio: 3 RATIO
Triglycerides: 106 mg/dL (ref <150)
VLDL: 21 mg/dL (ref 0–40)

## 2021-11-27 MED ORDER — ALBUTEROL SULFATE (2.5 MG/3ML) 0.083% IN NEBU
2.5000 mg | INHALATION_SOLUTION | Freq: Once | RESPIRATORY_TRACT | Status: AC
  Administered 2021-11-27: 2.5 mg via RESPIRATORY_TRACT

## 2021-11-29 LAB — PULMONARY FUNCTION TEST ARMC ONLY
DL/VA % pred: 106 %
DL/VA: 4.35 ml/min/mmHg/L
DLCO unc % pred: 88 %
DLCO unc: 23.3 ml/min/mmHg
FEF 25-75 Post: 1.96 L/sec
FEF 25-75 Pre: 2.28 L/sec
FEF2575-%Change-Post: -13 %
FEF2575-%Pred-Post: 74 %
FEF2575-%Pred-Pre: 86 %
FEV1-%Change-Post: 1 %
FEV1-%Pred-Post: 82 %
FEV1-%Pred-Pre: 81 %
FEV1-Post: 2.79 L
FEV1-Pre: 2.75 L
FEV1FVC-%Change-Post: 11 %
FEV1FVC-%Pred-Pre: 94 %
FEV6-%Change-Post: -8 %
FEV6-%Pred-Post: 82 %
FEV6-%Pred-Pre: 89 %
FEV6-Post: 3.53 L
FEV6-Pre: 3.86 L
FEV6FVC-%Change-Post: 0 %
FEV6FVC-%Pred-Post: 105 %
FEV6FVC-%Pred-Pre: 105 %
FVC-%Change-Post: -9 %
FVC-%Pred-Post: 78 %
FVC-%Pred-Pre: 86 %
FVC-Post: 3.56 L
FVC-Pre: 3.92 L
Post FEV1/FVC ratio: 78 %
Post FEV6/FVC ratio: 100 %
Pre FEV1/FVC ratio: 70 %
Pre FEV6/FVC Ratio: 100 %
RV % pred: 69 %
RV: 1.65 L
TLC % pred: 88 %
TLC: 6.2 L

## 2021-12-14 NOTE — Progress Notes (Signed)
Cardiology Office Note    Date:  12/18/2021   ID:  Richard Case, DOB 26-May-1955, MRN [id number]  PCP:  Eustaquio Boyden, MD  Cardiologist:  Lorine Bears, MD  Electrophysiologist:  None   Chief Complaint: Follow-up  History of Present Illness:   Richard Case is a 66 y.o. 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   He was evaluated in 2019 for exertional dyspnea with treadmill MPI showing no ischemia or scar with an EF of 55 to 65%.  BP demonstrated a hypertensive response to exercise.  He was noted to have mild PVCs with exercise and in recovery.  Overall, this was a low risk study.  Echo at that time showed an EF of 50 to 55%, mild LVH, grade 1 diastolic dysfunction, normal LV internal cavity size, normal RV systolic function and cavity size, normal size and structure aortic root, and no significant valvular abnormalities.     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  He was evaluated by vascular surgery at [facility] with no intervention indicated.  MRI of the brain in 05/2021 showed no evidence of acute intracranial abnormality.  There was also nonspecific mild marrow signal abnormality within the calvarium, possibly related to post treatment changes and/or artifact, though involvement of tumor was difficult to exclude.  PCP discussed this with the patient.  He was last seen in our office in 07/2021 and was without symptoms of frank angina or decompensation.  He did continue to note exertional dyspnea that was more pronounced when walking up an incline.  He reported the symptoms were  less noticeable while in [place], walking more on flat ground.  He requested a referral to pulmonology for his chronic dyspnea.  Coronary CTA in 08/2021 showed dependent bibasilar atelectasis with a calcium score of 285 which was the 68th percentile.  There was 25 to 49% stenosis in the proximal LAD and less than 25% stenosis in OM1.  Medical management was recommended.  He has been evaluated by pulmonology with PFTs showing minimal obstructive airway disease.  He comes in doing well from a cardiac perspective, and is without symptoms of angina or decompensation.  Since he was last seen, he has increased his activity level with woodworking, and hiking to look for unique pieces of wood for projects.  He does continue to note some exertional shortness of breath, though this is stable.  With increased activity level his weight is down 4 pounds today when compared to his last clinic visit.  He is without symptoms of dizziness, presyncope, or syncope.  Overall he feels better currently than he did at his last visit.   Labs independently reviewed: 11/2021 - TC 118, TG 106, HDL 39, LDL 58, albumin 4.0, AST/ALT normal 09/2021 - Hgb 16.0, PLT 242, A1c 5.9 04/2021 - A1c 6.2, TSH normal, potassium 4.5, BUN 14, serum creatinine 1.19   Past Medical History:  Diagnosis Date   ADD (attention deficit disorder) 2011   no records of workup; strattera caused urinary retention, not interested in habit forming medication   Atrial ectopy 2011   improved with CPAP, documented  by holter   Benign colon polyp ?2014   hyperplastic   BPH (benign prostatic hypertrophy)    per prior PCP records   Diabetes mellitus without complication (HCC)    Elevated blood pressure (not hypertension)    Erectile dysfunction    GERD (gastroesophageal reflux disease)    Gout    Heart murmur longstanding   History of kidney stones 2008, 2017   History of pneumonia 2009   HLD (hyperlipidemia)    Kidney stones    HX   Male erectile  dysfunction    Obesity, Class I, BMI 30-34.9    OSA (obstructive sleep apnea) 2010   OSA with stabilization at CPAP 7cm   Peyronie disease    per prior pcp records   Polycythemia    ?OSA related   Prediabetes    Rosacea    per prior pcp records   Seasonal allergic rhinitis    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    Past Surgical History:  Procedure Laterality Date   CARDIOVASCULAR STRESS TEST  07/2017   low risk stress test, hypertensive response (End)   COLONOSCOPY  2009   rec rpt 5 yrs per prior PCP records but no actual report   COLONOSCOPY  03/2012   mild diverticulosis, rpt 5 yrs (Dr Fransisca Connors in [place])   COLONOSCOPY WITH PROPOFOL N/A 12/18/2017   TAx2, HP, diverticulosis, rpt 5 yrs [name], [name], MD)   EXTRACORPOREAL SHOCK WAVE LITHOTRIPSY Right 04/06/2015   Procedure: EXTRACORPOREAL SHOCK WAVE LITHOTRIPSY (ESWL);  Surgeon: Hildred Laser, MD;  Location: [facility] ORS;  Service: Urology;  Laterality: Right;   head surgery     01/2021, squamos cell carcinoma   MOHS SURGERY  2015   SCC of nose   TONSILLECTOMY      Current Medications: Current Meds  Medication Sig   albuterol (VENTOLIN HFA) 108 (90 Base) MCG/ACT inhaler TAKE 2 PUFFS BY MOUTH EVERY 6 HOURS AS NEEDED FOR WHEEZE OR SHORTNESS OF BREATH   allopurinol (ZYLOPRIM) 300 MG tablet Take 1 tablet (300 mg total) by mouth daily.   aspirin 81 MG chewable tablet Chew 1 tablet (81 mg total) by mouth daily.   benzonatate (TESSALON) 100 MG capsule Take 1 capsule (100 mg total) by mouth 3 (three) times daily as needed for cough.   donepezil (ARICEPT) 10 MG tablet Take 1 tablet by mouth in the morning and at bedtime.   ibuprofen (ADVIL,MOTRIN) 200 MG tablet Take 400 mg by mouth every 6 (six) hours as needed for moderate pain.   loratadine (CLARITIN) 10 MG tablet Take 10 mg by mouth daily.   memantine (NAMENDA) 10 MG tablet Take by mouth daily.   metFORMIN (GLUCOPHAGE) 500 MG tablet Take 1 tablet (500 mg  total) by mouth daily with breakfast.   methylphenidate (RITALIN) 10 MG tablet Take 1 tablet (10 mg total) by mouth 2 (two) times daily.   predniSONE (DELTASONE) 20 MG tablet Take two tablets daily for 3 days followed by one tablet daily for 3 days (Patient taking differently: Take two tablets daily for 3 days followed by one tablet daily for 3 days as needed)   tadalafil (CIALIS) 5 MG tablet Take one daily for BPH (Patient taking differently: Take 5 mg by mouth daily as needed for erectile dysfunction. Only as needed)   tamsulosin (FLOMAX) 0.4 MG CAPS capsule Take 1 capsule (0.4 mg total) by mouth daily.   triamcinolone cream (KENALOG) 0.1 % Apply topically as needed.  vitamin B-12 (CYANOCOBALAMIN) 1000 MCG tablet Take 1 tablet (1,000 mcg total) by mouth daily.   [DISCONTINUED] atorvastatin (LIPITOR) 40 MG tablet Take 1 tablet (40 mg total) by mouth daily.    Allergies:   Dog epithelium and Strattera [atomoxetine hcl]   Social History   Socioeconomic History   Marital status: Married    Spouse name: Not on file   Number of children: Not on file   Years of education: Not on file   Highest education level: Not on file  Occupational History   Not on file  Tobacco Use   Smoking status: Never   Smokeless tobacco: Never  Vaping Use   Vaping Use: Never used  Substance and Sexual Activity   Alcohol use: Yes    Comment: once a month   Drug use: No   Sexual activity: Not on file  Other Topics Concern   Not on file  Social History Narrative   Lives with wife [place], 1 dog   Occupation: retired - Sport and exercise psychologist for [employer]   Edu: BS   Activity: active in yard   Diet: good water, fruits/vegetables daily   Social Determinants of Health   Financial Resource Strain: Low Risk  (05/24/2021)   Overall Financial Resource Strain (CARDIA)    Difficulty of Paying Living Expenses: Not hard at all  Food Insecurity: No Food Insecurity (05/24/2021)   Hunger Vital Sign    Worried About  Running Out of Food in the Last Year: Never true    Ran Out of Food in the Last Year: Never true  Transportation Needs: No Transportation Needs (05/24/2021)   PRAPARE - Administrator, Civil Service (Medical): No    Lack of Transportation (Non-Medical): No  Physical Activity: Inactive (05/24/2021)   Exercise Vital Sign    Days of Exercise per Week: 0 days    Minutes of Exercise per Session: 0 min  Stress: No Stress Concern Present (05/24/2021)   [employer] of Occupational Health - Occupational Stress Questionnaire    Feeling of Stress : Not at all  Social Connections: Not on file     Family History:  The patient's family history includes CAD in his father; Cancer in his mother; Cancer (age of onset: 4) in his father; Heart Problems in his father; Lung disease in his mother; Prostate cancer in his father. There is no history of Stroke, Diabetes, Kidney disease, Kidney cancer, or Bladder Cancer.  ROS:   12-point review of systems is negative unless otherwise noted in the HPI.   EKGs/Labs/Other Studies Reviewed:    Studies reviewed were summarized above. The additional studies were reviewed today:  Coronary CTA 08/16/2021: FINDINGS: Aorta:  Normal size.  No calcifications.  No dissection.   Aortic Valve:  Trileaflet.  No calcifications.   Coronary Arteries:  Normal coronary origin. Left dominance.   RCA is a non-dominant artery that gives rise to PDA and PLA. There is no plaque.   Left main gives rise to LAD and LCX arteries.  LM has no disease.   LAD has calcified plaque in the proximal segment causing mild stenosis (25-49%).   LCX is a dominant artery that gives rise to an OM1 branch and the PDA. There is calcified plaque in the proximal OM1 causing minimal stenosis (<25%).   Other findings:   Normal pulmonary vein drainage into the left atrium.   Normal left atrial appendage without a thrombus.   Normal size of the pulmonary artery.  IMPRESSION: 1. Coronary calcium score of 285. This was 68th percentile for age and sex matched control. 2. Normal coronary origin with right dominance. 3. Calcified plaque in the proximal LAD causing mild stenosis (25-49%). 4. Minimal OM1 stenosis (<25%). 5. CAD-RADS 2. Mild non-obstructive CAD (25-49%). Consider non-atherosclerotic causes of chest pain. Consider preventive therapy and risk factor modification. __________  2D echo 08/05/2017: - Left ventricle: The cavity size was normal. Wall thickness was    increased in a pattern of mild LVH. Systolic function was low    normal to mildly reduced. The estimated ejection fraction was in    the range of 50% to 55%. Doppler parameters are consistent with    abnormal left ventricular relaxation (grade 1 diastolic    dysfunction).  - Right ventricle: The cavity size was normal. Wall thickness was    normal. Systolic function was normal. __________   Treadmill MPI 08/05/2017: Normal exercise myocardial perfusion stress test without ischemia or scar. The left ventricular ejection fraction is normal (55-65%). Blood pressure demonstrated a hypertensive response to exercise. There was no ST segment deviation or T wave abnormalities noted during stress. Rare PVC's noted during stress and recovery. This is a low risk study.    EKG:  EKG is not ordered today.   Recent Labs: 04/16/2021: BUN 14; Potassium 4.5; Sodium 142; TSH 1.37 08/16/2021: Creatinine, Ser 1.10 10/11/2021: Hemoglobin 16.0; Platelets 242 11/27/2021: ALT 21  Recent Lipid Panel    Component Value Date/Time   CHOL 118 11/27/2021 1509   CHOL 187 05/04/2013 0000   TRIG 106 11/27/2021 1509   TRIG 126 05/04/2013 0000   HDL 39 (L) 11/27/2021 1509   CHOLHDL 3.0 11/27/2021 1509   VLDL 21 11/27/2021 1509   LDLCALC 58 11/27/2021 1509   LDLCALC 123 05/04/2013 0000   LDLDIRECT 118.0 03/22/2015 0745    PHYSICAL EXAM:    VS:  BP 100/72 (BP Location: Left Arm, Patient Position:  Sitting, Cuff Size: Normal)   Pulse 88   Ht 5\' 10"  (1.778 m)   Wt 224 lb 3.2 oz (101.7 kg)   SpO2 97%   BMI 32.17 kg/m   BMI: Body mass index is 32.17 kg/m.  Physical Exam Vitals reviewed.  Constitutional:      Appearance: He is well-developed.  HENT:     Head: Normocephalic and atraumatic.  Eyes:     General:        Right eye: No discharge.        Left eye: No discharge.  Neck:     Vascular: No JVD.  Cardiovascular:     Rate and Rhythm: Normal rate and regular rhythm.     Pulses:          Posterior tibial pulses are 2+ on the right side and 2+ on the left side.     Heart sounds: Normal heart sounds, S1 normal and S2 normal. Heart sounds not distant. No midsystolic click and no opening snap. No murmur heard.    No friction rub.  Pulmonary:     Effort: Pulmonary effort is normal. No respiratory distress.     Breath sounds: Normal breath sounds. No decreased breath sounds, wheezing or rales.  Chest:     Chest wall: No tenderness.  Abdominal:     General: There is no distension.  Musculoskeletal:     Cervical back: Normal range of motion.     Right lower leg: No edema.     Left lower leg: No edema.  Skin:  General: Skin is warm and dry.     Nails: There is no clubbing.  Neurological:     Mental Status: He is alert and oriented to person, place, and time.  Psychiatric:        Speech: Speech normal.        Behavior: Behavior normal.        Thought Content: Thought content normal.        Judgment: Judgment normal.     Wt Readings from Last 3 Encounters:  12/18/21 224 lb 3.2 oz (101.7 kg)  11/20/21 215 lb (97.5 kg)  10/31/21 224 lb (101.6 kg)     ASSESSMENT & PLAN:   Nonobstructive CAD: No symptoms suggestive of angina.  Recent coronary CTA showed nonobstructive disease.  Continue aggressive risk factor modification and primary prevention including aspirin and atorvastatin.  No indication for further ischemic testing at this time.  Exertional dyspnea:  Longstanding and stable.  No evidence of significant ischemic heart disease by coronary CTA.  Prefers to continue to escalate activity level with reservation for echo down the road pending symptom trend.  Suspect symptoms are multifactorial including underlying CAD, pulmonary component, obesity, and physical deconditioning.  Follow-up with pulmonology as directed.  HLD: LDL 58 in 11/2021 with normal AST/ALT at that time.  He remains on atorvastatin.  Carotid artery disease: Prior imaging at outside hospital in the summer 2023 showed 50% right ICA stenosis.  He is on aspirin and atorvastatin as outlined above.  Follow-up carotid artery ultrasound in the summer 2024.    Disposition: F/u with Dr. Kirke Corin or an APP in 6 months.   Medication Adjustments/Labs and Tests Ordered: Current medicines are reviewed at length with the patient today.  Concerns regarding medicines are outlined above. Medication changes, Labs and Tests ordered today are summarized above and listed in the Patient Instructions accessible in Encounters.   Signed, Eula Listen, PA-C 12/18/2021 4:14 PM     [facility] [place] [address] [place], [place] [address] [phone]

## 2021-12-17 ENCOUNTER — Encounter: Payer: Self-pay | Admitting: Family Medicine

## 2021-12-18 ENCOUNTER — Ambulatory Visit: Payer: Medicare Other | Attending: Physician Assistant | Admitting: Physician Assistant

## 2021-12-18 ENCOUNTER — Telehealth: Payer: Self-pay | Admitting: Family Medicine

## 2021-12-18 ENCOUNTER — Encounter: Payer: Self-pay | Admitting: Physician Assistant

## 2021-12-18 VITALS — BP 100/72 | HR 88 | Ht 70.0 in | Wt 224.2 lb

## 2021-12-18 DIAGNOSIS — E785 Hyperlipidemia, unspecified: Secondary | ICD-10-CM | POA: Insufficient documentation

## 2021-12-18 DIAGNOSIS — I2510 Atherosclerotic heart disease of native coronary artery without angina pectoris: Secondary | ICD-10-CM | POA: Insufficient documentation

## 2021-12-18 DIAGNOSIS — I6521 Occlusion and stenosis of right carotid artery: Secondary | ICD-10-CM | POA: Diagnosis not present

## 2021-12-18 DIAGNOSIS — R0609 Other forms of dyspnea: Secondary | ICD-10-CM | POA: Diagnosis not present

## 2021-12-18 MED ORDER — ATORVASTATIN CALCIUM 40 MG PO TABS: 40.0000 mg | ORAL_TABLET | Freq: Every day | ORAL | 0 refills | Status: DC

## 2021-12-18 NOTE — Patient Instructions (Signed)
Medication Instructions:  No changes at this time.   *If you need a refill on your cardiac medications before your next appointment, please call your pharmacy*   Lab Work: None  If you have labs (blood work) drawn today and your tests are completely normal, you will receive your results only by: [place] (if you have MyChart) OR A paper copy in the mail If you have any lab test that is abnormal or we need to change your treatment, we will call you to review the results.   Testing/Procedures: None   Follow-Up: At [facility], you and your health needs are our priority.  As part of our continuing mission to provide you with exceptional heart care, we have created designated Provider Care Teams.  These Care Teams include your primary Cardiologist (physician) and Advanced Practice Providers (APPs -  Physician Assistants and Nurse Practitioners) who all work together to provide you with the care you need, when you need it.   Your next appointment:   6 month(s)  The format for your next appointment:   In Person  Provider:   Kathlyn Sacramento, MD or Christell Faith, PA-C        Important Information About Sugar

## 2021-12-18 NOTE — Telephone Encounter (Signed)
Plz notify I received latest eye exam from [place] but it looks like it was not a diabetic eye exam, just a routine eye exam.  Would offer scheduling retinopathy screen at our office for this year, but when he sees them next year should let them know she wants a diabetic eye exam.

## 2021-12-19 NOTE — Telephone Encounter (Signed)
Spoke with pt's wife, Otho Perl (on dpr) relaying Dr. Synthia Innocent message [except part about retinopathy screening at our office- pt is not part of [place]. Verbalizes understanding and will inform pt.

## 2022-01-15 ENCOUNTER — Encounter: Payer: Self-pay | Admitting: Pulmonary Disease

## 2022-01-15 ENCOUNTER — Ambulatory Visit (INDEPENDENT_AMBULATORY_CARE_PROVIDER_SITE_OTHER): Payer: Medicare Other | Admitting: Pulmonary Disease

## 2022-01-15 VITALS — BP 124/72 | HR 70 | Temp 97.3°F | Ht 70.0 in | Wt 233.0 lb

## 2022-01-15 DIAGNOSIS — R053 Chronic cough: Secondary | ICD-10-CM

## 2022-01-15 DIAGNOSIS — J329 Chronic sinusitis, unspecified: Secondary | ICD-10-CM | POA: Diagnosis not present

## 2022-01-15 DIAGNOSIS — J683 Other acute and subacute respiratory conditions due to chemicals, gases, fumes and vapors: Secondary | ICD-10-CM | POA: Diagnosis not present

## 2022-01-15 DIAGNOSIS — R0609 Other forms of dyspnea: Secondary | ICD-10-CM

## 2022-01-15 NOTE — Patient Instructions (Signed)
Use your as needed inhaler if you get to coughing.  Or if you are short of breath.  Good job on losing weight.  This will help you with your overall health and also with your issues of shortness of breath and cough.  I do recommend that you touch base with either [name] or Dr. Fletcher Anon with regards to your circulation issues in your feet.  Dr. Fletcher Anon does deal with the peripheral vascular system as well.  We will see you in follow-up in 4 months time call sooner should any problems arise.

## 2022-01-15 NOTE — Progress Notes (Signed)
 " Cardiology Office Note:    Date:  01/16/2022   ID:  Richard Case, DOB [birth year], MRN [id number]  PCP:  Rilla Baller, MD   [facility] Providers Cardiologist:  Deatrice Cage, MD     Referring MD: Rilla Baller, MD   Chief complaint: cold feet   History of Present Illness:    Richard Case is a 67 y.o. 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  August 2023, coronary CTA in 08/2021 showed dependent bibasilar atelectasis with a calcium  score of 285 which was the 68th percentile. There was 25 to 49% stenosis in the proximal LAD and less than 25% stenosis in OM1. Medical management was recommended.   Was last evaluated in office on 12/18/21 by Bernardino Bring, PA. At that time, he was doing relatively well, hiking and active.   He presents today with concern of decreased circulation to his feet. He states for months his feet have been cold at night with burning sensation that has been keeping him up at night. He states he saw Dr. Euna in [place] over the summer and has had a nerve conduction study, but he is not sure of the results (unable to locate in care everywhere). He denies claudication, cramping, heaviness during exertion. States his symptoms are mostly noticeable at night and are bothersome when he rests. He remains active, stands for long periods of time woodworking, [place]. He denies chest pain, palpitations, dyspnea, pnd, orthopnea, n, v, dizziness, syncope, edema, weight gain, or early satiety.     Past Medical History:  Diagnosis Date   ADD (attention deficit disorder) 2011   no records of workup; strattera caused urinary retention, not interested in habit forming medication   Atrial ectopy 2011   improved with CPAP, documented by holter   Benign colon polyp  ?2014   hyperplastic   BPH (benign prostatic hypertrophy)    per prior PCP records   Diabetes mellitus without complication (HCC)    Elevated blood pressure (not hypertension)    Erectile dysfunction    GERD (gastroesophageal reflux disease)    Gout    Heart murmur longstanding   History of kidney stones 2008, 2017   History of pneumonia 2009   HLD (hyperlipidemia)    Kidney stones    HX   Male erectile dysfunction    Obesity, Class I, BMI 30-34.9    OSA (obstructive sleep apnea) 2010   OSA with stabilization at CPAP 7cm   Peyronie disease    per prior pcp records   Polycythemia    ?OSA related   Prediabetes    Rosacea    per prior pcp records   Seasonal allergic rhinitis    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    Past Surgical History:  Procedure Laterality Date   CARDIOVASCULAR STRESS TEST  07/2017   low risk stress test, hypertensive response (End)   COLONOSCOPY  2009   rec rpt 5 yrs per prior PCP records but no actual report   COLONOSCOPY  03/2012   mild diverticulosis, rpt 5 yrs (Dr Arron in [place])   COLONOSCOPY WITH PROPOFOL  N/A 12/18/2017   TAx2, HP, diverticulosis, rpt 5 yrs Romero, Ruel, MD)   EXTRACORPOREAL SHOCK WAVE LITHOTRIPSY Right 04/06/2015   Procedure: EXTRACORPOREAL SHOCK WAVE LITHOTRIPSY (  ESWL);  Surgeon: Redell Lynwood Napoleon, MD;  Location: [facility] ORS;  Service: Urology;  Laterality: Right;   head surgery     01/2021, squamos cell carcinoma   MOHS SURGERY  2015   SCC of nose   TONSILLECTOMY      Current Medications: Current Meds  Medication Sig   albuterol  (VENTOLIN  HFA) 108 (90 Base) MCG/ACT inhaler TAKE 2 PUFFS BY MOUTH EVERY 6 HOURS AS NEEDED FOR WHEEZE OR SHORTNESS OF BREATH   allopurinol  (ZYLOPRIM ) 300 MG tablet Take 1 tablet (300 mg total) by mouth daily.   amoxicillin (AMOXIL) 500 MG capsule Take 500 mg by mouth 3 (three) times daily.   aspirin 81 MG chewable tablet Chew 1 tablet (81 mg total) by mouth daily.    atorvastatin  (LIPITOR) 40 MG tablet Take 1 tablet (40 mg total) by mouth daily.   donepezil (ARICEPT) 10 MG tablet Take 1 tablet by mouth in the morning and at bedtime.   ibuprofen (ADVIL,MOTRIN) 200 MG tablet Take 400 mg by mouth every 6 (six) hours as needed for moderate pain.   loratadine (CLARITIN) 10 MG tablet Take 10 mg by mouth daily.   memantine  (NAMENDA ) 10 MG tablet Take by mouth daily.   metFORMIN  (GLUCOPHAGE ) 500 MG tablet Take 1 tablet (500 mg total) by mouth daily with breakfast.   methylphenidate (RITALIN) 10 MG tablet Take 1 tablet (10 mg total) by mouth 2 (two) times daily.   tadalafil  (CIALIS ) 5 MG tablet Take 5 mg by mouth daily as needed for erectile dysfunction.   tamsulosin  (FLOMAX ) 0.4 MG CAPS capsule Take 1 capsule (0.4 mg total) by mouth daily.   triamcinolone  cream (KENALOG ) 0.1 % Apply topically as needed.   vitamin B-12 (CYANOCOBALAMIN ) 1000 MCG tablet Take 1 tablet (1,000 mcg total) by mouth daily.   Vitamin D , Ergocalciferol , (DRISDOL ) 1.25 MG (50000 UNIT) CAPS capsule Take 50,000 Units by mouth once a week.     Allergies:   Dog epithelium and Strattera [atomoxetine hcl]   Social History   Socioeconomic History   Marital status: Married    Spouse name: Not on file   Number of children: Not on file   Years of education: Not on file   Highest education level: Not on file  Occupational History   Not on file  Tobacco Use   Smoking status: Never   Smokeless tobacco: Never  Vaping Use   Vaping Use: Never used  Substance and Sexual Activity   Alcohol use: Yes    Comment: once a month   Drug use: No   Sexual activity: Not on file  Other Topics Concern   Not on file  Social History Narrative   Lives with wife Chilo, 1 dog   Occupation: retired - sport and exercise psychologist for [employer]   Edu: BS   Activity: active in yard   Diet: good water, fruits/vegetables daily   Social Determinants of Health   Financial Resource Strain: Low Risk  (05/24/2021)   Overall  Financial Resource Strain (CARDIA)    Difficulty of Paying Living Expenses: Not hard at all  Food Insecurity: No Food Insecurity (05/24/2021)   Hunger Vital Sign    Worried About Running Out of Food in the Last Year: Never true    Ran Out of Food in the Last Year: Never true  Transportation Needs: No Transportation Needs (05/24/2021)   PRAPARE - Administrator, Civil Service (Medical): No    Lack of Transportation (Non-Medical): No  Physical Activity:  Inactive (05/24/2021)   Exercise Vital Sign    Days of Exercise per Week: 0 days    Minutes of Exercise per Session: 0 min  Stress: No Stress Concern Present (05/24/2021)   [employer] of Occupational Health - Occupational Stress Questionnaire    Feeling of Stress : Not at all  Social Connections: Not on file     Family History: The patient's family history includes CAD in his father; Cancer in his mother; Cancer (age of onset: 11) in his father; Heart Problems in his father; Lung disease in his mother; Prostate cancer in his father. There is no history of Stroke, Diabetes, Kidney disease, Kidney cancer, or Bladder Cancer.  ROS:   Review of Systems  Constitutional: Negative.   HENT: Negative.    Eyes: Negative.   Respiratory: Negative.    Cardiovascular:  Negative for chest pain, palpitations, orthopnea, claudication, leg swelling and PND.  Gastrointestinal: Negative.   Genitourinary: Negative.   Musculoskeletal: Negative.   Skin: Negative.   Neurological:  Positive for tingling (feet at night) and weakness (when walking up hills). Negative for dizziness.  Endo/Heme/Allergies: Negative.   Psychiatric/Behavioral:  Positive for memory loss.       EKGs/Labs/Other Studies Reviewed:    The following studies were reviewed today:  08/16/21 Coronary CTA - LAD has calcified plaque in the proximal segment causing mild stenosis (25-49%). LCX is a dominant artery that gives rise to an OM1 branch and the PDA. There is calcified  plaque in the proximal OM1 causing minimal stenosis (<25%). Coronary calcium  score of 285. This was 68th percentile for age and sex matched control.  07/16/2017 echo complete - The estimated ejection fraction was in the range of 50% to 55%, mild LVH, grade 1 DD.   EKG:  EKG is ordered today.  The ekg ordered today demonstrates NSR, HR 84 bpm, consistent with previous tracings.   Recent Labs: 04/16/2021: BUN 14; Potassium 4.5; Sodium 142; TSH 1.37 08/16/2021: Creatinine, Ser 1.10 10/11/2021: Hemoglobin 16.0; Platelets 242 11/27/2021: ALT 21  Recent Lipid Panel    Component Value Date/Time   CHOL 118 11/27/2021 1509   CHOL 187 05/04/2013 0000   TRIG 106 11/27/2021 1509   TRIG 126 05/04/2013 0000   HDL 39 (L) 11/27/2021 1509   CHOLHDL 3.0 11/27/2021 1509   VLDL 21 11/27/2021 1509   LDLCALC 58 11/27/2021 1509   LDLCALC 123 05/04/2013 0000   LDLDIRECT 118.0 03/22/2015 0745     Risk Assessment/Calculations:                Physical Exam:    VS:  BP 130/74 (BP Location: Left Arm, Patient Position: Sitting, Cuff Size: Large)   Pulse 84   Ht 5' 10 (1.778 m)   Wt 230 lb 6 oz (104.5 kg)   SpO2 98%   BMI 33.06 kg/m     Wt Readings from Last 3 Encounters:  01/16/22 230 lb 6 oz (104.5 kg)  01/15/22 233 lb (105.7 kg)  12/18/21 224 lb 3.2 oz (101.7 kg)     Physical Exam Constitutional:      General: He is not in acute distress.    Appearance: He is obese. He is not ill-appearing.  Neck:     Vascular: No carotid bruit.  Cardiovascular:     Rate and Rhythm: Normal rate and regular rhythm.     Pulses: Normal pulses.          Radial pulses are 2+ on the right side and 2+  on the left side.       Dorsalis pedis pulses are 2+ on the right side and 2+ on the left side.       Posterior tibial pulses are 2+ on the right side and 2+ on the left side.  Pulmonary:     Effort: Pulmonary effort is normal.     Breath sounds: Normal breath sounds.  Abdominal:     Palpations: Abdomen is  soft.  Musculoskeletal:        General: No swelling.     Right lower leg: No edema.     Left lower leg: No edema.  Feet:     Right foot:     Skin integrity: Skin integrity normal. No ulcer, blister, skin breakdown or erythema.     Toenail Condition: Fungal disease present.    Left foot:     Skin integrity: Skin integrity normal. No ulcer, blister, skin breakdown or erythema.     Toenail Condition: Fungal disease present.    Comments: Normal hair distribution noted to lower extremities  Skin:    General: Skin is warm and dry.     Capillary Refill: Capillary refill takes less than 2 seconds.  Neurological:     Mental Status: He is alert.      ASSESSMENT:    1. Neuropathic pain   2. Coronary artery disease, non-occlusive   3. Exertional dyspnea   4. Stenosis of right carotid artery   5. Pain of lower leg, unspecified laterality    PLAN:    In order of problems listed above:  Neuropathic pain/lower bilateral leg pain - This has been chronic for him ~ years, although he states the cold sensation has worsened over the last few months. No edema, no varicosities, skin is pink warm and dry, cap refill < 2 seconds, no ulcerations, normal hair distribution. B12 was previously checked by PCP earlier this year and was WNL. Symptoms are more favorable for neuropathic pain, however will arrange for ABI/LE arterial US  to rule out arterial insufficiency that may be contributing to his discomfort. He specifically asks for a prescription for pentoxifillin, as this helped his father in law several years ago, who had severe PAD with ulcerations. Discussed that this is not indicated until we have further information. If his ABIs are normal, he will need to follow up with his PCP for management of his neuropathic pain.  CAD, non-occlusive - Denies angina or acute decompensation. Remains active. Continue aspirin and atorvastatin .  HLD - LDL 58 11/2021, well managed, continue atorvastatin .  Carotid  artery disease - Prior imaging at outside hospital in the summer 2023 showed 50% right ICA stenosis. He is on aspirin and atorvastatin , planned US  f/u in Summer 2024.            Medication Adjustments/Labs and Tests Ordered: Current medicines are reviewed at length with the patient today.  Concerns regarding medicines are outlined above.  Orders Placed This Encounter  Procedures   EKG 12-Lead   VAS US  ABI WITH/WO TBI   VAS US  LOWER EXTREMITY ARTERIAL DUPLEX   No orders of the defined types were placed in this encounter.   Patient Instructions  Medication Instructions:  No changes at this time.   *If you need a refill on your cardiac medications before your next appointment, please call your pharmacy*   Lab Work: None  If you have labs (blood work) drawn today and your tests are completely normal, you will receive your results only by: MyChart  Message (if you have MyChart) OR A paper copy in the mail If you have any lab test that is abnormal or we need to change your treatment, we will call you to review the results.   Testing/Procedures: Your physician has requested that you have an ankle brachial index (ABI). During this test an ultrasound and blood pressure cuff are used to evaluate the arteries that supply the arms and legs with blood. Allow thirty minutes for this exam. There are no restrictions or special instructions.  Your physician has requested that you have a lower extremity arterial exercise duplex. During this test, exercise and ultrasound are used to evaluate arterial blood flow in the legs. Allow one hour for this exam. There are no restrictions or special instructions.    Follow-Up: At [facility], you and your health needs are our priority.  As part of our continuing mission to provide you with exceptional heart care, we have created designated Provider Care Teams.  These Care Teams include your primary Cardiologist (physician) and Advanced Practice  Providers (APPs -  Physician Assistants and Nurse Practitioners) who all work together to provide you with the care you need, when you need it.  Your next appointment:   Keep scheduled appointment on 06/26/22 at 4:00 pm with Dr. Darron  The format for your next appointment:   In Person  Provider:   Deatrice Darron, MD        Important Information About Sugar         Signed, Delon JAYSON Hoover, NP  01/16/2022 1:32 PM     [facility] "

## 2022-01-15 NOTE — Progress Notes (Signed)
Subjective:    Patient ID: Richard Case, male    DOB: Jun 24, 1955, 67 y.o.   MRN: [id number] Patient Care Team: [name], MD as PCP - General (Family Medicine) [name], MD as PCP - Cardiology (Cardiology)  Chief Complaint  Patient presents with   Follow-up    SOB with hills. Cough has improved. Has lost weight and that has helped.    HPI Patient is a 67 year old lifelong never smoker who follows here for the issue of a chronic cough.  He was initially evaluated here on 11 October 2021.  At that time it appear that his cough was related mostly to upper airway cough syndrome (postnasal drip).  PFTs did not show significant abnormality except perhaps some very mild restriction due to obesity (ERV 28%) and minimal airway obstruction consistent with reactive airways.  Since his initial evaluation the patient has engaged in weight loss which has helped him with perceived dyspnea on exertion while going up hills or stairs.  Also has noted that his cough has improved following nasal hygiene and using Zyrtec at bedtime.  He does not endorse any fevers, chills or sweats.  Rare occasional cough, no sputum production, no hemoptysis.  No chest pain, no lower extremity edema no calf tenderness.  He does complain of his feet being cold at nighttime, he is unsure whether this may be a circulatory problem.  He does see [name] and Dr. Fletcher Anon. Overall he feels well and looks well.  DATA 10/31/2021 IgE/allergen panel: IgE 30, Allergen panel negative. 11/27/2021 PFTs: FEV1 2.75 L or 81% predicted, FVC 3.92 L or 86% predicted, FEV1/FVC 70% mild restriction on the basis of obesity (ERV 28%) diffusion capacity normal. Overall unremarkable PFTs with minimal airway obstruction  Review of Systems A 10 point review of systems was performed and it is as noted above otherwise negative.  Patient Active Problem List   Diagnosis Date Noted   Acute cough 09/28/2021   Carotid stenosis 06/12/2021    Advanced directives, counseling/discussion 06/06/2021   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 04/17/2021   Memory deficit 04/16/2021   Chronic cough 06/28/2020   Burnout of caregiver 11/24/2019   Medicare annual wellness visit, subsequent 11/24/2019   Sensorineural hearing loss, bilateral 12/12/2018   Tinnitus 10/29/2017   Exertional dyspnea 07/23/2017   Radon exposure 04/04/2016   Complex renal cyst 06/03/2015   Hydronephrosis, right 04/19/2015   History of kidney stones 04/04/2015   Peyronie disease 04/04/2015   Health maintenance examination 03/28/2015   Acute pain of left shoulder 03/28/2015   BPH (benign prostatic hyperplasia) 03/28/2015   Hyperlipidemia associated with type 2 diabetes mellitus [facility])    Male erectile dysfunction    Polycythemia    OSA (obstructive sleep apnea)    Obesity, Class I, BMI 30-34.9    Seasonal allergic rhinitis    Gout    Controlled diabetes mellitus type II without complication (HCC)    Social History   Tobacco Use   Smoking status: Never   Smokeless tobacco: Never  Substance Use Topics   Alcohol use: Yes    Comment: once a month   Allergies  Allergen Reactions   Dog Epithelium Rash   Strattera [Atomoxetine Hcl] Other (See Comments)    Urinary retention   Current Meds  Medication Sig   albuterol (VENTOLIN HFA) 108 (90 Base) MCG/ACT inhaler TAKE 2 PUFFS BY MOUTH EVERY 6 HOURS AS NEEDED FOR WHEEZE OR SHORTNESS OF BREATH   allopurinol (ZYLOPRIM)  300 MG tablet Take 1 tablet (300 mg total) by mouth daily.   amoxicillin (AMOXIL) 500 MG capsule Take 500 mg by mouth 3 (three) times daily.   aspirin 81 MG chewable tablet Chew 1 tablet (81 mg total) by mouth daily.   atorvastatin (LIPITOR) 40 MG tablet Take 1 tablet (40 mg total) by mouth daily.   benzonatate (TESSALON) 100 MG capsule Take 1 capsule (100 mg total) by mouth 3 (three) times daily as needed for cough.   donepezil (ARICEPT) 10 MG tablet Take 1 tablet by mouth in the  morning and at bedtime.   ibuprofen (ADVIL,MOTRIN) 200 MG tablet Take 400 mg by mouth every 6 (six) hours as needed for moderate pain.   loratadine (CLARITIN) 10 MG tablet Take 10 mg by mouth daily.   memantine (NAMENDA) 10 MG tablet Take by mouth daily.   metFORMIN (GLUCOPHAGE) 500 MG tablet Take 1 tablet (500 mg total) by mouth daily with breakfast.   methylphenidate (RITALIN) 10 MG tablet Take 1 tablet (10 mg total) by mouth 2 (two) times daily.   tamsulosin (FLOMAX) 0.4 MG CAPS capsule Take 1 capsule (0.4 mg total) by mouth daily.   triamcinolone cream (KENALOG) 0.1 % Apply topically as needed.   vitamin B-12 (CYANOCOBALAMIN) 1000 MCG tablet Take 1 tablet (1,000 mcg total) by mouth daily.   Vitamin D, Ergocalciferol, (DRISDOL) 1.25 MG (50000 UNIT) CAPS capsule Take 50,000 Units by mouth once a week.   Immunization History  Administered Date(s) Administered   Influenza Inj Mdck Quad Pf 01/15/2017   Influenza, High Dose Seasonal PF 11/08/2019   Influenza,inj,Quad PF,6+ Mos 01/27/2015, 10/29/2017, 11/03/2018   Influenza-Unspecified 12/15/2015   MODERNA COVID-19 SARS-COV-2 PEDS BIVALENT BOOSTER 6Y-11Y 03/18/2019, 04/14/2019, 06/16/2020   Pneumococcal Conjugate-13 11/24/2019   Pneumococcal Polysaccharide-23 01/27/2015   Rabies, IM 08/17/2014, 08/20/2014, 08/24/2014, 09/02/2014, 09/14/2014   Tdap 03/28/2015   Zoster Recombinat (Shingrix) 11/03/2018, 02/25/2019       Objective:   Physical Exam BP 124/72 (BP Location: Left Arm, Cuff Size: Large)   Pulse 70   Temp (!) 97.3 F (36.3 C)   Ht '5\' 10"'$  (1.778 m)   Wt 233 lb (105.7 kg)   SpO2 97%   BMI 33.43 kg/m  GENERAL: Well-developed, obese gentleman, no acute distress, fully ambulatory, no conversational dyspnea. HEAD: Normocephalic, atraumatic.  Scalp scar from prior squamous cell carcinoma resection well-healed. EYES: Pupils equal, round, reactive to light.  No scleral icterus.  MOUTH: Dentition intact, no thrush.  Oral mucosa  moist. NECK: Supple. No thyromegaly. Trachea midline. No JVD.  No adenopathy. PULMONARY: Good air entry bilaterally.  No adventitious sounds. CARDIOVASCULAR: S1 and S2. Regular rate and rhythm.  No rubs, murmurs or gallops heard. ABDOMEN: Obese otherwise benign. MUSCULOSKELETAL: No joint deformity, no clubbing, no edema.  NEUROLOGIC: No overt focal deficit, no gait disturbance, speech is fluent. SKIN: Intact,warm,dry. PSYCH: Mood and behavior normal  Lab Results  Component Value Date   NITRICOXIDE 16 10/11/2021   Recent Results (from the past 2160 hour(s))  Testosterone     Status: None   Collection Time: 10/31/21  2:35 PM  Result Value Ref Range   Testosterone 394 264 - 916 ng/dL    Comment: Adult male reference interval is based on a population of healthy nonobese males (BMI <30) between 96 and 77 years old. [place], [place] [phone]. PMID: [id number].   Urinalysis, Complete     Status: Abnormal   Collection Time: 10/31/21  3:56 PM  Result Value Ref Range  Specific Gravity, UA 1.025 1.005 - 1.030   pH, UA 5.5 5.0 - 7.5   Color, UA Yellow Yellow   Appearance Ur Clear Clear   Leukocytes,UA Negative Negative   Protein,UA 1+ (A) Negative/Trace   Glucose, UA Negative Negative   Ketones, UA Negative Negative   RBC, UA Negative Negative   Bilirubin, UA Negative Negative   Urobilinogen, Ur 0.2 0.2 - 1.0 mg/dL   Nitrite, UA Negative Negative   Microscopic Examination See below:   Microscopic Examination     Status: None   Collection Time: 10/31/21  3:56 PM   Urine  Result Value Ref Range   WBC, UA 0-5 0 - 5 /hpf   RBC, Urine 0-2 0 - 2 /hpf   Epithelial Cells (non renal) 0-10 0 - 10 /hpf   Bacteria, UA None seen None seen/Few  Allergen Panel (27) + IGE     Status: Abnormal   Collection Time: 10/31/21  4:13 PM  Result Value Ref Range   Class Description Allergens Comment     Comment: (NOTE)    Levels of Specific IgE       Class  Description of Class     ---------------------------  -----  --------------------                   < 0.10         0         Negative           0.10 -    0.31         0/I       Equivocal/Low           0.32 -    0.55         I         Low           0.56 -    1.40         II        Moderate           1.41 -    3.90         III       High           3.91 -   19.00         IV        Very High          19.01 -  100.00         V         Very High                  >100.00         VI        Very High    IgE (Immunoglobulin E), Serum 30 6 - 495 IU/mL   D Pteronyssinus IgE <0.10 Class 0 kU/L   D Farinae IgE <0.10 Class 0 kU/L   Cat Dander IgE <0.10 Class 0 kU/L   Dog Dander IgE 0.30 (A) Class 0/I kU/L   [place] Grass IgE <0.10 Class 0 kU/L   Timothy Grass IgE <0.10 Class 0 kU/L   [place] Bluegrass IgE <0.10 Class 0 kU/L   Johnson Grass IgE <0.10 Class 0 kU/L   Bahia Grass IgE <0.10 Class 0 kU/L   Cockroach, American IgE <0.10 Class 0 kU/L    Comment: (NOTE) This test was developed and its performance characteristics determined by [facility].  It has not been cleared or approved by the U.S. Food and Drug Administration. The FDA has determined that such clearance or approval is not necessary. This test is used for clinical purposes.  It should not be regarded as investigational or for research.    Penicillium Chrysogen IgE <0.10 Class 0 kU/L   Cladosporium Herbarum IgE <0.10 Class 0 kU/L   Aspergillus Fumigatus IgE <0.10 Class 0 kU/L   Mucor Racemosus IgE <0.10 Class 0 kU/L   Alternaria Alternata IgE <0.10 Class 0 kU/L   Setomelanomma Rostrat <0.10 Class 0 kU/L   Oak, White IgE <0.10 Class 0 kU/L   Elm, American IgE <0.10 Class 0 kU/L   Maple/Box Elder IgE <0.10 Class 0 kU/L   Common Silver Wendee Copp IgE <0.10 Class 0 kU/L   Hickory, White IgE <0.10 Class 0 kU/L    Comment: (NOTE) This test was developed and its performance characteristics determined by [facility].  It has not been cleared or approved by the U.S. Food  and Drug Administration. The FDA has determined that such clearance or approval is not necessary. This test is used for clinical purposes.  It should not be regarded as investigational or for research.    White Mulberry IgE <0.10 Class 0 kU/L   [place], [place] IgE <0.10 Class 0 kU/L   Ragweed, Short IgE <0.10 Class 0 kU/L   Plantain, English IgE <0.10 Class 0 kU/L   Cocklebur IgE <0.10 Class 0 kU/L   Pigweed, Rough IgE <0.10 Class 0 kU/L    Comment: (NOTE) Performed At: [facility] [place] [address] [place], [place] [id number] Rush Farmer MD TI:[id number]   Urinalysis, Complete     Status: Abnormal   Collection Time: 11/20/21  2:41 PM  Result Value Ref Range   Specific Gravity, UA 1.030 1.005 - 1.030   pH, UA 5.0 5.0 - 7.5   Color, UA Yellow Yellow   Appearance Ur Clear Clear   Leukocytes,UA Negative Negative   Protein,UA 1+ (A) Negative/Trace   Glucose, UA Negative Negative   Ketones, UA Negative Negative   RBC, UA Negative Negative   Bilirubin, UA Negative Negative   Urobilinogen, Ur 0.2 0.2 - 1.0 mg/dL   Nitrite, UA Negative Negative   Microscopic Examination See below:     Comment: SPERM QUANTITATION REQUESTED BY PATIENT PER PROVIDER. OBSERVED >30/HPF. MB 11-20-21.   Microscopic Examination     Status: None   Collection Time: 11/20/21  2:41 PM   Urine  Result Value Ref Range   WBC, UA 0-5 0 - 5 /hpf   RBC, Urine 0-2 0 - 2 /hpf   Epithelial Cells (non renal) 0-10 0 - 10 /hpf   Bacteria, UA None seen None seen/Few  Pulmonary Function Test [facility] Only     Status: None   Collection Time: 11/27/21  2:52 PM  Result Value Ref Range   FVC-Pre 3.92 L   FVC-%Pred-Pre 86 %   FVC-Post 3.56 L   FVC-%Pred-Post 78 %   FVC-%Change-Post -9 %   FEV1-Pre 2.75 L   FEV1-%Pred-Pre 81 %   FEV1-Post 2.79 L   FEV1-%Pred-Post 82 %   FEV1-%Change-Post 1 %   FEV6-Pre 3.86 L   FEV6-%Pred-Pre 89 %   FEV6-Post 3.53 L   FEV6-%Pred-Post 82 %   FEV6-%Change-Post -8 %   Pre  FEV1/FVC ratio 70 %   FEV1FVC-%Pred-Pre 94 %   Post FEV1/FVC ratio 78 %   FEV1FVC-%Change-Post 11 %   Pre FEV6/FVC Ratio 100 %  FEV6FVC-%Pred-Pre 105 %   Post FEV6/FVC ratio 100 %   FEV6FVC-%Pred-Post 105 %   FEV6FVC-%Change-Post 0 %   FEF 25-75 Pre 2.28 L/sec   FEF2575-%Pred-Pre 86 %   FEF 25-75 Post 1.96 L/sec   FEF2575-%Pred-Post 74 %   FEF2575-%Change-Post -13 %   RV 1.65 L   RV % pred 69 %   TLC 6.20 L   TLC % pred 88 %   DLCO unc 23.30 ml/min/mmHg   DLCO unc % pred 88 %   DL/VA 4.35 ml/min/mmHg/L   DL/VA % pred 106 %  Hepatic function panel     Status: None   Collection Time: 11/27/21  3:09 PM  Result Value Ref Range   Total Protein 7.3 6.5 - 8.1 g/dL   Albumin 4.0 3.5 - 5.0 g/dL   AST 26 15 - 41 U/L   ALT 21 0 - 44 U/L   Alkaline Phosphatase 71 38 - 126 U/L   Total Bilirubin 0.9 0.3 - 1.2 mg/dL   Bilirubin, Direct 0.2 0.0 - 0.2 mg/dL   Indirect Bilirubin 0.7 0.3 - 0.9 mg/dL    Comment: Performed at [facility], [address] [place], [place] [address]  Lipid panel     Status: Abnormal   Collection Time: 11/27/21  3:09 PM  Result Value Ref Range   Cholesterol 118 0 - 200 mg/dL   Triglycerides 106 <150 mg/dL   HDL 39 (L) >40 mg/dL   Total CHOL/HDL Ratio 3.0 RATIO   VLDL 21 0 - 40 mg/dL   LDL Cholesterol 58 0 - 99 mg/dL    Comment:        Total Cholesterol/HDL:CHD Risk Coronary Heart Disease Risk Table                     Men   Women  1/2 Average Risk   3.4   3.3  Average Risk       5.0   4.4  2 X Average Risk   9.6   7.1  3 X Average Risk  23.4   11.0        Use the calculated Patient Ratio above and the CHD Risk Table to determine the patient's CHD Risk.        ATP III CLASSIFICATION (LDL):  <100     mg/dL   Optimal  100-129  mg/dL   Near or Above                    Optimal  130-159  mg/dL   Borderline  160-189  mg/dL   High  >190     mg/dL   Very High Performed at [facility], [address] [place], [place] [address]         Assessment & Plan:     ICD-10-CM   1. Reactive airways dysfunction syndrome (HCC)  J68.3    Continue as needed albuterol    2. Chronic cough  R05.3    Marked improvement with nasal hygiene Upper airway cough syndrome    3. 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  He has some complaint of his feet feeling cold particularly at nighttime.  This has been a longstanding issue.  I recommended he discuss this with his primary care  physician or with Dr. Fletcher Anon and his cardiologist.  He may need peripheral vascular evaluation.  Will see the patient in follow-up in 4 months time he is to contact us prior to that time should any new problems arise.  Renold Don, MD Advanced Bronchoscopy PCCM [facility]    *This note was dictated using voice recognition software/Dragon.  Despite best efforts to proofread, errors can occur which can change the meaning. Any transcriptional errors that result from this process are unintentional and may not be fully corrected at the time of dictation.

## 2022-01-16 ENCOUNTER — Ambulatory Visit: Payer: Medicare Other | Attending: Physician Assistant | Admitting: Cardiology

## 2022-01-16 ENCOUNTER — Encounter: Payer: Self-pay | Admitting: Physician Assistant

## 2022-01-16 VITALS — BP 130/74 | HR 84 | Ht 70.0 in | Wt 230.4 lb

## 2022-01-16 DIAGNOSIS — M79669 Pain in unspecified lower leg: Secondary | ICD-10-CM | POA: Diagnosis present

## 2022-01-16 DIAGNOSIS — I2510 Atherosclerotic heart disease of native coronary artery without angina pectoris: Secondary | ICD-10-CM | POA: Diagnosis present

## 2022-01-16 DIAGNOSIS — M792 Neuralgia and neuritis, unspecified: Secondary | ICD-10-CM

## 2022-01-16 DIAGNOSIS — R0609 Other forms of dyspnea: Secondary | ICD-10-CM | POA: Diagnosis not present

## 2022-01-16 DIAGNOSIS — I6521 Occlusion and stenosis of right carotid artery: Secondary | ICD-10-CM

## 2022-01-16 NOTE — Patient Instructions (Signed)
Medication Instructions:  No changes at this time.   *If you need a refill on your cardiac medications before your next appointment, please call your pharmacy*   Lab Work: None  If you have labs (blood work) drawn today and your tests are completely normal, you will receive your results only by: [facility] (if you have MyChart) OR A paper copy in the mail If you have any lab test that is abnormal or we need to change your treatment, we will call you to review the results.   Testing/Procedures: Your physician has requested that you have an ankle brachial index (ABI). During this test an ultrasound and blood pressure cuff are used to evaluate the arteries that supply the arms and legs with blood. Allow thirty minutes for this exam. There are no restrictions or special instructions.  Your physician has requested that you have a lower extremity arterial exercise duplex. During this test, exercise and ultrasound are used to evaluate arterial blood flow in the legs. Allow one hour for this exam. There are no restrictions or special instructions.    Follow-Up: At [facility], you and your health needs are our priority.  As part of our continuing mission to provide you with exceptional heart care, we have created designated Provider Care Teams.  These Care Teams include your primary Cardiologist (physician) and Advanced Practice Providers (APPs -  Physician Assistants and Nurse Practitioners) who all work together to provide you with the care you need, when you need it.  Your next appointment:   Keep scheduled appointment on 06/26/22 at 4:00 pm with Dr. Fletcher Anon  The format for your next appointment:   In Person  Provider:   Kathlyn [place], MD        Important Information About Sugar

## 2022-01-21 ENCOUNTER — Encounter: Payer: Self-pay | Admitting: Pulmonary Disease

## 2022-01-25 ENCOUNTER — Ambulatory Visit: Payer: Medicare Other | Attending: Cardiology

## 2022-01-25 DIAGNOSIS — M79669 Pain in unspecified lower leg: Secondary | ICD-10-CM | POA: Insufficient documentation

## 2022-01-25 DIAGNOSIS — M792 Neuralgia and neuritis, unspecified: Secondary | ICD-10-CM | POA: Diagnosis not present

## 2022-01-25 DIAGNOSIS — M79605 Pain in left leg: Secondary | ICD-10-CM | POA: Diagnosis not present

## 2022-01-25 DIAGNOSIS — M79604 Pain in right leg: Secondary | ICD-10-CM | POA: Diagnosis not present

## 2022-01-26 LAB — VAS US LOWER EXT ART SEG MULTI (SEGMENTALS & LE RAYNAUDS)
Left ABI: 1.2
Right ABI: 1.29

## 2022-01-29 ENCOUNTER — Ambulatory Visit: Payer: Medicare Other | Admitting: Family Medicine

## 2022-02-19 ENCOUNTER — Encounter: Payer: Self-pay | Admitting: Family Medicine

## 2022-02-19 ENCOUNTER — Ambulatory Visit (INDEPENDENT_AMBULATORY_CARE_PROVIDER_SITE_OTHER): Payer: Medicare Other | Admitting: Family Medicine

## 2022-02-19 VITALS — BP 134/76 | HR 95 | Temp 97.2°F | Ht 70.0 in | Wt 234.4 lb

## 2022-02-19 DIAGNOSIS — G6289 Other specified polyneuropathies: Secondary | ICD-10-CM | POA: Diagnosis not present

## 2022-02-19 DIAGNOSIS — E538 Deficiency of other specified B group vitamins: Secondary | ICD-10-CM | POA: Insufficient documentation

## 2022-02-19 DIAGNOSIS — I6521 Occlusion and stenosis of right carotid artery: Secondary | ICD-10-CM

## 2022-02-19 DIAGNOSIS — E1169 Type 2 diabetes mellitus with other specified complication: Secondary | ICD-10-CM

## 2022-02-19 DIAGNOSIS — G629 Polyneuropathy, unspecified: Secondary | ICD-10-CM | POA: Insufficient documentation

## 2022-02-19 DIAGNOSIS — R413 Other amnesia: Secondary | ICD-10-CM

## 2022-02-19 DIAGNOSIS — G4733 Obstructive sleep apnea (adult) (pediatric): Secondary | ICD-10-CM | POA: Diagnosis not present

## 2022-02-19 DIAGNOSIS — R058 Other specified cough: Secondary | ICD-10-CM

## 2022-02-19 LAB — POCT GLYCOSYLATED HEMOGLOBIN (HGB A1C): Hemoglobin A1C: 6.2 % — AB (ref 4.0–5.6)

## 2022-02-19 MED ORDER — CYANOCOBALAMIN 1000 MCG/ML IJ SOLN
1000.0000 ug | Freq: Once | INTRAMUSCULAR | Status: AC
  Administered 2022-02-19: 1000 ug via INTRAMUSCULAR

## 2022-02-19 NOTE — Progress Notes (Unsigned)
Patient ID: Richard Case, male    DOB: October 24, 1955, 67 y.o.   MRN: [id number]  This visit was conducted in person.  BP 134/76   Pulse 95   Temp (!) 97.2 F (36.2 C) (Temporal)   Ht '5\' 10"'$  (1.778 m)   Wt 234 lb 6 oz (106.3 kg)   SpO2 97%   BMI 33.63 kg/m    CC: 4 mo DM f/u visit  Subjective:   HPI: Richard Case is a 67 y.o. male presenting on 02/19/2022 for Medical Management of Chronic Issues (Here for 4 mo DM f/u. Also, worsening neuropathy in B lateral hands/feet and groin. )   "Not doing well" - notes progressive neuropathy symptoms to hands, feet, groin, even scalp of head. Some burning discomfort to feet as well. He saw neurologist in [place] 01/2022 s/p NCS - told progressive neuropathy. Stared on gabapentin and pentoxifylline without any benefit. He feels pentoxifylline has helped stamina. No noted varicose veins of legs. Symptoms ongoing for about 6 months.   Known diabetic Occasional shot of baileys.  Continues B12 1067mg daily (gummy). He was prescribed shot of b12 by neurologist.  Lab Results  Component Value Date   VITAMINB12 357 04/16/2021   He is on namenda '10mg'$  BID and ritalin '10mg'$  BID through neurology (Dr RJuanetta Beets. He states he is not taking aricept .  Saw cardiology with reassuring coronary CTA (nonobstructive disease). ABIs reassuringly normal 01/2022.   Cough - saw Dr GPatsey Bertholdpulm. Dx RAD with albuterol PRN. Significant improvement with nasal hygiene - likely upper airway cough syndrome.   DM - does regularly check sugars - 120-180. Notes he's less active in cold weather. Compliant with antihyperglycemic regimen which includes: metformin '500mg'$  daily. Denies low sugars or hypoglycemic symptoms. Denies paresthesias, blurry vision. Last diabetic eye exam 10/2021. Glucometer brand: unsure. Last foot exam: 09/2021. DSME: has declined. Lab Results  Component Value Date   HGBA1C 6.2 (A) 02/19/2022   Diabetic Foot Exam - Simple   Simple Foot  Form Diabetic Foot exam was performed with the following findings: Yes 02/19/2022  4:00 PM  Visual Inspection No deformities, no ulcerations, no other skin breakdown bilaterally: Yes Sensation Testing Intact to touch and monofilament testing bilaterally: Yes Pulse Check Posterior Tibialis and Dorsalis pulse intact bilaterally: Yes Comments    Lab Results  Component Value Date   MICROALBUR 1.5 04/27/2021         Relevant past medical, surgical, family and social history reviewed and updated as indicated. Interim medical history since our last visit reviewed. Allergies and medications reviewed and updated. Outpatient Medications Prior to Visit  Medication Sig Dispense Refill   albuterol (VENTOLIN HFA) 108 (90 Base) MCG/ACT inhaler TAKE 2 PUFFS BY MOUTH EVERY 6 HOURS AS NEEDED FOR WHEEZE OR SHORTNESS OF BREATH 18 each 1   allopurinol (ZYLOPRIM) 300 MG tablet Take 1 tablet (300 mg total) by mouth daily. 90 tablet 3   aspirin 81 MG chewable tablet Chew 1 tablet (81 mg total) by mouth daily.     atorvastatin (LIPITOR) 40 MG tablet Take 1 tablet (40 mg total) by mouth daily. 90 tablet 0   gabapentin (NEURONTIN) 300 MG capsule Take 1 capsule by mouth 3 (three) times daily. With meals     ibuprofen (ADVIL,MOTRIN) 200 MG tablet Take 400 mg by mouth every 6 (six) hours as needed for moderate pain.     loratadine (CLARITIN) 10 MG tablet Take 10 mg by mouth daily.     metFORMIN (  GLUCOPHAGE) 500 MG tablet Take 1 tablet (500 mg total) by mouth daily with breakfast. 90 tablet 3   methylphenidate (RITALIN) 10 MG tablet Take 1 tablet (10 mg total) by mouth 2 (two) times daily.     pentoxifylline (TRENTAL) 400 MG CR tablet Take 400 mg by mouth 3 (three) times daily.     tadalafil (CIALIS) 5 MG tablet Take 5 mg by mouth daily as needed for erectile dysfunction.     tamsulosin (FLOMAX) 0.4 MG CAPS capsule Take 1 capsule (0.4 mg total) by mouth daily. 90 capsule 3   triamcinolone cream (KENALOG) 0.1 %  Apply topically as needed. 30 g 0   vitamin B-12 (CYANOCOBALAMIN) 1000 MCG tablet Take 1 tablet (1,000 mcg total) by mouth daily.     Vitamin D, Ergocalciferol, (DRISDOL) 1.25 MG (50000 UNIT) CAPS capsule Take 50,000 Units by mouth once a week.     donepezil (ARICEPT) 10 MG tablet Take 1 tablet by mouth in the morning and at bedtime.     memantine (NAMENDA) 10 MG tablet Take by mouth daily.     memantine (NAMENDA) 10 MG tablet Take 1 tablet (10 mg total) by mouth 2 (two) times daily. For memory     amoxicillin (AMOXIL) 500 MG capsule Take 500 mg by mouth 3 (three) times daily.     No facility-administered medications prior to visit.     Per HPI unless specifically indicated in ROS section below Review of Systems  Objective:  BP 134/76   Pulse 95   Temp (!) 97.2 F (36.2 C) (Temporal)   Ht '5\' 10"'$  (1.778 m)   Wt 234 lb 6 oz (106.3 kg)   SpO2 97%   BMI 33.63 kg/m   Wt Readings from Last 3 Encounters:  02/19/22 234 lb 6 oz (106.3 kg)  01/16/22 230 lb 6 oz (104.5 kg)  01/15/22 233 lb (105.7 kg)      Physical Exam Vitals and nursing note reviewed.  Constitutional:      Appearance: Normal appearance. He is not ill-appearing.  HENT:     Mouth/Throat:     Comments: Wearing mask Eyes:     Extraocular Movements: Extraocular movements intact.     Pupils: Pupils are equal, round, and reactive to light.  Cardiovascular:     Rate and Rhythm: Normal rate and regular rhythm.     Pulses: Normal pulses.     Heart sounds: Normal heart sounds. No murmur heard. Pulmonary:     Effort: Pulmonary effort is normal. No respiratory distress.     Breath sounds: Normal breath sounds. No wheezing, rhonchi or rales.  Musculoskeletal:     Right lower leg: No edema.     Left lower leg: No edema.     Comments:  See HPI for foot exam if done Sensation intact to hands and feet  Skin:    General: Skin is warm and dry.     Findings: No rash.     Comments: Slowed cap refill to feet  Neurological:      Mental Status: He is alert.  Psychiatric:        Mood and Affect: Mood normal.        Behavior: Behavior normal.       Results for orders placed or performed in visit on 02/19/22  POCT glycosylated hemoglobin (Hb A1C)  Result Value Ref Range   Hemoglobin A1C 6.2 (A) 4.0 - 5.6 %   HbA1c POC (<> result, manual entry)  HbA1c, POC (prediabetic range)     HbA1c, POC (controlled diabetic range)      Assessment & Plan:   Problem List Items Addressed This Visit     Type 2 diabetes mellitus with other specified complication ([place]) - Primary    Chronic, great control on metformin - continue this.       Relevant Orders   POCT glycosylated hemoglobin (Hb A1C) (Completed)   TSH   OSA (obstructive sleep apnea)   Peripheral neuropathy   Relevant Medications   gabapentin (NEURONTIN) 300 MG capsule   memantine (NAMENDA) 10 MG tablet   Other Relevant Orders   Vitamin B12   TSH   CBC with Differential/Platelet   Basic metabolic panel   Sedimentation rate   ANA   Serum protein electrophoresis with reflex   Vitamin B1   Low serum vitamin B12   Relevant Orders   Vitamin B12   Methylmalonic Acid, Serum     Meds ordered this encounter  Medications   cyanocobalamin (VITAMIN B12) injection 1,000 mcg    Orders Placed This Encounter  Procedures   Vitamin B12   Methylmalonic Acid, Serum   TSH   CBC with Differential/Platelet   Basic metabolic panel   Sedimentation rate   ANA   Serum protein electrophoresis with reflex   Vitamin B1   POCT glycosylated hemoglobin (Hb A1C)    Patient Instructions  We will request records from Dr Juanetta Beets neurologist in [place] (office visit and nerve conduction study).  Labs today then b12 shot today - we will be in touch with results. Let us know how you feel after the B12 shot.  Return in 4 months (after 06/07/2022) for medicare wellness visit.   Follow up plan: Return in about 4 months (around 06/20/2022) for medicare wellness  visit.  [name], MD

## 2022-02-19 NOTE — Patient Instructions (Addendum)
We will request records from Dr Juanetta Beets neurologist in [place] (office visit and nerve conduction study).  Labs today then b12 shot today - we will be in touch with results. Let us know how you feel after the B12 shot.  Return in 4 months (after 06/07/2022) for medicare wellness visit.

## 2022-02-19 NOTE — Assessment & Plan Note (Signed)
Chronic, great control on metformin - continue this.

## 2022-02-20 LAB — SEDIMENTATION RATE: Sed Rate: 15 mm/hr (ref 0–20)

## 2022-02-20 LAB — CBC WITH DIFFERENTIAL/PLATELET
Basophils Absolute: 0.1 10*3/uL (ref 0.0–0.1)
Basophils Relative: 0.7 % (ref 0.0–3.0)
Eosinophils Absolute: 0.2 10*3/uL (ref 0.0–0.7)
Eosinophils Relative: 2.5 % (ref 0.0–5.0)
HCT: 47 % (ref 39.0–52.0)
Hemoglobin: 16.1 g/dL (ref 13.0–17.0)
Lymphocytes Relative: 26 % (ref 12.0–46.0)
Lymphs Abs: 1.9 10*3/uL (ref 0.7–4.0)
MCHC: 34.2 g/dL (ref 30.0–36.0)
MCV: 92.8 fl (ref 78.0–100.0)
Monocytes Absolute: 0.9 10*3/uL (ref 0.1–1.0)
Monocytes Relative: 12.3 % — ABNORMAL HIGH (ref 3.0–12.0)
Neutro Abs: 4.2 10*3/uL (ref 1.4–7.7)
Neutrophils Relative %: 58.5 % (ref 43.0–77.0)
Platelets: 217 10*3/uL (ref 150.0–400.0)
RBC: 5.07 Mil/uL (ref 4.22–5.81)
RDW: 13.1 % (ref 11.5–15.5)
WBC: 7.2 10*3/uL (ref 4.0–10.5)

## 2022-02-20 LAB — BASIC METABOLIC PANEL
BUN: 15 mg/dL (ref 6–23)
CO2: 30 mEq/L (ref 19–32)
Calcium: 9.7 mg/dL (ref 8.4–10.5)
Chloride: 103 mEq/L (ref 96–112)
Creatinine, Ser: 1.21 mg/dL (ref 0.40–1.50)
GFR: 61.89 mL/min (ref >60.00)
Glucose, Bld: 132 mg/dL — ABNORMAL HIGH (ref 70–99)
Potassium: 4.4 mEq/L (ref 3.5–5.1)
Sodium: 142 mEq/L (ref 135–145)

## 2022-02-20 LAB — VITAMIN B12: Vitamin B-12: 660 pg/mL (ref 211–911)

## 2022-02-20 LAB — TSH: TSH: 0.76 u[IU]/mL (ref 0.35–5.50)

## 2022-02-20 NOTE — Assessment & Plan Note (Addendum)
Update B12, MMA.  After labs, will provide b12 shot today.

## 2022-02-20 NOTE — Assessment & Plan Note (Signed)
Followed by [facility] neurologist Dr Juanetta Beets on namenda - states he's not taking aricept.  Will request latest OV and recent NCS results.

## 2022-02-20 NOTE — Assessment & Plan Note (Signed)
States he's been intolerant of CPAP.  Discussed how untreated OSA could contriubte to poor sleep, daytime somnolence.

## 2022-02-20 NOTE — Assessment & Plan Note (Addendum)
Endorses 6 months of progressive neuropathy symptoms to extremities as well as groin and top of head. Anticipate component of diabetic neuropathy as well as possible b12 deficiency related - update B12 along with MMA level.  No benefit with gabapentin.  Will further evaluate with labwork today.

## 2022-02-20 NOTE — Assessment & Plan Note (Signed)
Established with pulm Dr Patsey Berthold.

## 2022-02-21 LAB — METHYLMALONIC ACID, SERUM: Methylmalonic Acid, Quant: 167 nmol/L (ref 87–318)

## 2022-02-22 ENCOUNTER — Encounter: Payer: Self-pay | Admitting: Family Medicine

## 2022-02-22 ENCOUNTER — Telehealth: Payer: Self-pay | Admitting: Family Medicine

## 2022-02-22 LAB — PROTEIN ELECTROPHORESIS, SERUM, WITH REFLEX
Albumin ELP: 3.8 g/dL (ref 3.8–4.8)
Alpha 1: 0.2 g/dL (ref 0.2–0.3)
Alpha 2: 0.8 g/dL (ref 0.5–0.9)
Beta 2: 0.5 g/dL (ref 0.2–0.5)
Beta Globulin: 0.4 g/dL (ref 0.4–0.6)
Gamma Globulin: 0.9 g/dL (ref 0.8–1.7)
Total Protein: 6.7 g/dL (ref 6.1–8.1)

## 2022-02-22 LAB — ANTI-NUCLEAR AB-TITER (ANA TITER)
ANA TITER: 1:40 {titer} — ABNORMAL HIGH
ANA Titer 1: 1:80 {titer} — ABNORMAL HIGH

## 2022-02-22 LAB — ANA: Anti Nuclear Antibody (ANA): POSITIVE — AB

## 2022-02-22 NOTE — Telephone Encounter (Signed)
Pt's lab results faxed to Dr. Trula Ore on 02/19/22.  Left message on vm per dpr notifying pt of info above.

## 2022-02-22 NOTE — Telephone Encounter (Signed)
Patient wife Richard Case called in and stated that Dr. Trula Ore needs lab results from Dr. Darnell Level to be sent over to him and vice versa. His office number is [phone]. Please advise. Thank you!

## 2022-02-24 LAB — VITAMIN B1: Vitamin B1 (Thiamine): 8 nmol/L (ref 8–30)

## 2022-03-05 NOTE — Telephone Encounter (Signed)
Faxed all 02/19/22 lab results to [facility] at [phone], attn:  Dr. Trula Ore.

## 2022-03-05 NOTE — Telephone Encounter (Signed)
Can we resend labwork to DR [facility] as several lab results returned after 02/19/2022. thanks

## 2022-03-14 ENCOUNTER — Telehealth: Payer: Self-pay | Admitting: Family Medicine

## 2022-03-14 DIAGNOSIS — G6289 Other specified polyneuropathies: Secondary | ICD-10-CM

## 2022-03-14 DIAGNOSIS — E1169 Type 2 diabetes mellitus with other specified complication: Secondary | ICD-10-CM

## 2022-03-14 NOTE — Telephone Encounter (Signed)
Pt called asking for a referral to a podiatrist? Pt states Dr. Darnell Level has seen him for his foot issue before. Pt requested for referral to be sent to [facility] in [place]. Call back # [phone]

## 2022-03-15 NOTE — Telephone Encounter (Signed)
Spoke with pt relaying Dr. G's message. Pt expresses his thanks.  

## 2022-03-15 NOTE — Telephone Encounter (Signed)
Podiatry referral placed.

## 2022-03-15 NOTE — Addendum Note (Signed)
Addended by: [name] on: 03/15/2022 07:30 AM   Modules accepted: Orders

## 2022-03-17 ENCOUNTER — Encounter: Payer: Self-pay | Admitting: Family Medicine

## 2022-04-08 ENCOUNTER — Ambulatory Visit: Payer: Medicare Other | Admitting: Podiatry

## 2022-04-23 ENCOUNTER — Ambulatory Visit (INDEPENDENT_AMBULATORY_CARE_PROVIDER_SITE_OTHER): Payer: Medicare Other | Admitting: Podiatry

## 2022-04-23 ENCOUNTER — Encounter: Payer: Self-pay | Admitting: Podiatry

## 2022-04-23 DIAGNOSIS — B351 Tinea unguium: Secondary | ICD-10-CM

## 2022-04-23 MED ORDER — TERBINAFINE HCL 250 MG PO TABS: 250.0000 mg | ORAL_TABLET | Freq: Every day | ORAL | 0 refills | Status: DC

## 2022-04-23 NOTE — Progress Notes (Signed)
Chief Complaint  Patient presents with   Peripheral Neuropathy    "I have this Neuropathy that has developed.  It keeps me up at night" N - Neuropathy L - toes 1-5 D - 6-8 months O - gradually worse C - burning, tingling, cold A - relaxing/resting T - Cardiologist, Neurologist, GP, Gabapentin, Pentoxifyline   Nail Problem    "I have fungus on my big toenail." N - fungus L - rt hallux D - 4-5 O - gradually worse C - thick, discolored A - none T - pedicure    Subjective: 67 y.o. male presenting today for evaluation of discoloration with thickening to the bilateral toenails.  He is also complaining of progressive peripheral polyneuropathy that is been ongoing for about 8 months now.  He is working closely with his neurologist who has tried different medications and prescriptions.  He has also had nerve conduction study performed which demonstrates progressive deterioration of the large motor nerves of the lower extremities.  Patient states that the majority of his issue is the cold feeling that he experiences to his feet as well as the groin area in the middle of the night.  He presents for further treatment evaluation  Past Medical History:  Diagnosis Date   ADD (attention deficit disorder) 2011   no records of workup; strattera caused urinary retention, not interested in habit forming medication   Atrial ectopy 2011   improved with CPAP, documented by holter   Benign colon polyp ?2014   hyperplastic   BPH (benign prostatic hypertrophy)    per prior PCP records   Diabetes mellitus without complication    Elevated blood pressure (not hypertension)    Erectile dysfunction    GERD (gastroesophageal reflux disease)    Gout    Heart murmur longstanding   History of kidney stones 2008, 2017   History of pneumonia 2009   HLD (hyperlipidemia)    Kidney stones    HX   Male erectile dysfunction    Obesity, Class I, BMI 30-34.9    OSA (obstructive sleep apnea) 2010   OSA with  stabilization at CPAP 7cm   Peyronie disease    per prior pcp records   Polycythemia    ?OSA related   Prediabetes    Rosacea    per prior pcp records   Seasonal allergic rhinitis    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    Past Surgical History:  Procedure Laterality Date   CARDIOVASCULAR STRESS TEST  07/2017   low risk stress test, hypertensive response (End)   COLONOSCOPY  2009   rec rpt 5 yrs per prior PCP records but no actual report   COLONOSCOPY  03/2012   mild diverticulosis, rpt 5 yrs (Dr Fransisca Connors in [place])   COLONOSCOPY WITH PROPOFOL N/A 12/18/2017   TAx2, HP, diverticulosis, rpt 5 yrs [name], [name], MD)   EXTRACORPOREAL SHOCK WAVE LITHOTRIPSY Right 04/06/2015   Procedure: EXTRACORPOREAL SHOCK WAVE LITHOTRIPSY (ESWL);  Surgeon: Hildred Laser, MD;  Location: [facility] ORS;  Service: Urology;  Laterality: Right;   head surgery     01/2021, squamos cell carcinoma   MOHS SURGERY  2015   SCC of nose   TONSILLECTOMY      Allergies  Allergen Reactions   Strattera [Atomoxetine Hcl] Other (See Comments)    Urinary retention    Objective: Physical Exam General: The patient is alert and oriented x3 in no acute distress.  Dermatology: Hyperkeratotic, discolored, thickened, onychodystrophy  noted bilateral great toenails. Skin is warm, dry and supple bilateral lower extremities. Negative for open lesions or macerations.  Vascular: Palpable pedal pulses bilaterally. No edema or erythema noted. Capillary refill within normal limits.  Neurological: Grossly intact via light touch  Musculoskeletal Exam: No pedal deformity noted  Assessment: #1 Onychomycosis of toenails -Today we discussed different treatment options including oral, topical, and laser antifungal treatment modalities.  We discussed their efficacies and side effects.  Patient opts for oral antifungal treatment modality -Prescription for Lamisil 250 mg #90 daily. Pt denies a history of liver  pathology or symptoms. 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  Neurology had recommended follow-up with a neuro spine specialist to have his back evaluated.  I agree with this.  I do believe the patient may be suffering from degenerative changes of the spine causing the neuropathy -Return to clinic as needed  Felecia Shelling, DPM [facility]  Dr. Felecia Shelling, DPM    [address] [place], [place] [address]                Office [phone]  Fax [phone]

## 2022-06-06 ENCOUNTER — Other Ambulatory Visit: Payer: Self-pay | Admitting: Podiatry

## 2022-06-06 NOTE — Telephone Encounter (Signed)
A refill request was sent in for oral terbinafine 250mg .  Looks like the original prescription was for 90 days' worth of medication, so patient should not need any refills at this time since it was just prescribed one month ago (April 2024).  Refill request denied at this time.

## 2022-06-15 ENCOUNTER — Other Ambulatory Visit: Payer: Self-pay | Admitting: Family Medicine

## 2022-06-15 DIAGNOSIS — E1169 Type 2 diabetes mellitus with other specified complication: Secondary | ICD-10-CM

## 2022-06-15 DIAGNOSIS — D751 Secondary polycythemia: Secondary | ICD-10-CM

## 2022-06-15 DIAGNOSIS — G6289 Other specified polyneuropathies: Secondary | ICD-10-CM

## 2022-06-15 DIAGNOSIS — M109 Gout, unspecified: Secondary | ICD-10-CM

## 2022-06-15 DIAGNOSIS — E538 Deficiency of other specified B group vitamins: Secondary | ICD-10-CM

## 2022-06-15 DIAGNOSIS — N401 Enlarged prostate with lower urinary tract symptoms: Secondary | ICD-10-CM

## 2022-06-15 MED ORDER — THIAMINE HCL 100 MG PO TABS: 100.0000 mg | ORAL_TABLET | Freq: Every day | ORAL | Status: DC

## 2022-06-15 NOTE — Telephone Encounter (Addendum)
Consider UPEP, FLC, CXR for amyloid eval.  Will discuss at OV.

## 2022-06-15 NOTE — Addendum Note (Signed)
Addended by: Eustaquio Boyden on: 06/15/2022 11:07 AM   Modules accepted: Orders

## 2022-06-18 ENCOUNTER — Encounter: Payer: Self-pay | Admitting: Family Medicine

## 2022-06-18 ENCOUNTER — Ambulatory Visit (INDEPENDENT_AMBULATORY_CARE_PROVIDER_SITE_OTHER): Payer: Medicare Other | Admitting: Family Medicine

## 2022-06-18 VITALS — BP 124/82 | HR 72 | Temp 97.3°F | Ht 69.5 in | Wt 232.0 lb

## 2022-06-18 DIAGNOSIS — E785 Hyperlipidemia, unspecified: Secondary | ICD-10-CM

## 2022-06-18 DIAGNOSIS — G4733 Obstructive sleep apnea (adult) (pediatric): Secondary | ICD-10-CM

## 2022-06-18 DIAGNOSIS — Z125 Encounter for screening for malignant neoplasm of prostate: Secondary | ICD-10-CM

## 2022-06-18 DIAGNOSIS — Z7189 Other specified counseling: Secondary | ICD-10-CM

## 2022-06-18 DIAGNOSIS — Z7984 Long term (current) use of oral hypoglycemic drugs: Secondary | ICD-10-CM

## 2022-06-18 DIAGNOSIS — D751 Secondary polycythemia: Secondary | ICD-10-CM

## 2022-06-18 DIAGNOSIS — E1169 Type 2 diabetes mellitus with other specified complication: Secondary | ICD-10-CM | POA: Diagnosis not present

## 2022-06-18 DIAGNOSIS — G6289 Other specified polyneuropathies: Secondary | ICD-10-CM | POA: Diagnosis not present

## 2022-06-18 DIAGNOSIS — C4442 Squamous cell carcinoma of skin of scalp and neck: Secondary | ICD-10-CM

## 2022-06-18 DIAGNOSIS — Z0000 Encounter for general adult medical examination without abnormal findings: Secondary | ICD-10-CM | POA: Diagnosis not present

## 2022-06-18 DIAGNOSIS — E669 Obesity, unspecified: Secondary | ICD-10-CM

## 2022-06-18 DIAGNOSIS — M109 Gout, unspecified: Secondary | ICD-10-CM | POA: Diagnosis not present

## 2022-06-18 DIAGNOSIS — N401 Enlarged prostate with lower urinary tract symptoms: Secondary | ICD-10-CM

## 2022-06-18 DIAGNOSIS — R3912 Poor urinary stream: Secondary | ICD-10-CM

## 2022-06-18 DIAGNOSIS — R413 Other amnesia: Secondary | ICD-10-CM

## 2022-06-18 DIAGNOSIS — E538 Deficiency of other specified B group vitamins: Secondary | ICD-10-CM

## 2022-06-18 LAB — CBC WITH DIFFERENTIAL/PLATELET
Basophils Absolute: 0 10*3/uL (ref 0.0–0.1)
Basophils Relative: 0.7 % (ref 0.0–3.0)
Eosinophils Absolute: 0.2 10*3/uL (ref 0.0–0.7)
Eosinophils Relative: 3.4 % (ref 0.0–5.0)
HCT: 47.2 % (ref 39.0–52.0)
Hemoglobin: 15.8 g/dL (ref 13.0–17.0)
Lymphocytes Relative: 29.8 % (ref 12.0–46.0)
Lymphs Abs: 1.6 10*3/uL (ref 0.7–4.0)
MCHC: 33.5 g/dL (ref 30.0–36.0)
MCV: 93.8 fl (ref 78.0–100.0)
Monocytes Absolute: 0.7 10*3/uL (ref 0.1–1.0)
Monocytes Relative: 13.4 % — ABNORMAL HIGH (ref 3.0–12.0)
Neutro Abs: 2.8 10*3/uL (ref 1.4–7.7)
Neutrophils Relative %: 52.7 % (ref 43.0–77.0)
Platelets: 199 10*3/uL (ref 150.0–400.0)
RBC: 5.04 Mil/uL (ref 4.22–5.81)
RDW: 13 % (ref 11.5–15.5)
WBC: 5.3 10*3/uL (ref 4.0–10.5)

## 2022-06-18 LAB — LIPID PANEL
Cholesterol: 120 mg/dL (ref 0–200)
HDL: 42.2 mg/dL (ref >39.00)
LDL Cholesterol: 63 mg/dL (ref 0–99)
NonHDL: 78.27
Total CHOL/HDL Ratio: 3
Triglycerides: 76 mg/dL (ref 0.0–149.0)
VLDL: 15.2 mg/dL (ref 0.0–40.0)

## 2022-06-18 LAB — HEMOGLOBIN A1C: Hgb A1c MFr Bld: 6.2 % (ref 4.6–6.5)

## 2022-06-18 LAB — COMPREHENSIVE METABOLIC PANEL
ALT: 22 U/L (ref 0–53)
AST: 24 U/L (ref 0–37)
Albumin: 4.3 g/dL (ref 3.5–5.2)
Alkaline Phosphatase: 81 U/L (ref 39–117)
BUN: 15 mg/dL (ref 6–23)
CO2: 30 mEq/L (ref 19–32)
Calcium: 9.4 mg/dL (ref 8.4–10.5)
Chloride: 103 mEq/L (ref 96–112)
Creatinine, Ser: 1.11 mg/dL (ref 0.40–1.50)
GFR: 68.48 mL/min (ref >60.00)
Glucose, Bld: 116 mg/dL — ABNORMAL HIGH (ref 70–99)
Potassium: 4.1 mEq/L (ref 3.5–5.1)
Sodium: 140 mEq/L (ref 135–145)
Total Bilirubin: 0.9 mg/dL (ref 0.2–1.2)
Total Protein: 7 g/dL (ref 6.0–8.3)

## 2022-06-18 LAB — PSA, MEDICARE: PSA: 1.6 ng/ml (ref 0.10–4.00)

## 2022-06-18 LAB — MICROALBUMIN / CREATININE URINE RATIO
Creatinine,U: 88.6 mg/dL
Microalb Creat Ratio: 0.8 mg/g (ref 0.0–30.0)
Microalb, Ur: <0.7 mg/dL (ref 0.0–1.9)

## 2022-06-18 LAB — URIC ACID: Uric Acid, Serum: 5.5 mg/dL (ref 4.0–7.8)

## 2022-06-18 NOTE — Patient Instructions (Addendum)
Labs today  Sign request for records from neurology Dr Raylene Miyamoto for latest 2 office notes and labs.  You will be due for colonoscopy in December 2024 Check with pharmacy about RSV shot.  Bring [place] copy of your living will when complete.  Return in 6 months for diabetes follow up visit.

## 2022-06-18 NOTE — Assessment & Plan Note (Addendum)
Advanced directive planning - does not have this. Would want wife Richard Case to be HCPOA. Packet provided previously. Planning to hire attorney. Asked to bring [place] a copy

## 2022-06-18 NOTE — Progress Notes (Addendum)
Ph: [phone] Fax: [phone]   Patient ID: Richard Case, male    DOB: Feb 02, 1955, 67 y.o.   MRN: [id number]  This visit was conducted in person.  BP 124/82   Pulse 72   Temp (!) 97.3 F (36.3 C) (Temporal)   Ht 5' 9.5" (1.765 m)   Wt 232 lb (105.2 kg)   SpO2 96%   BMI 33.77 kg/m    CC: AMW Subjective:   HPI: Richard Case is a 67 y.o. male presenting on 06/18/2022 for Medicare Wellness (C/o worsening neuropathy in B hands/feet. Disturbing sleep.)   Did not see health advisor this year.   Hearing Screening   500Hz  1000Hz  2000Hz  4000Hz   Right ear 20 20 20  0  Left ear 20 20 20  40  Vision Screening - Comments:: Last eye exam, 10/2021.  Flowsheet Row Office Visit from 06/18/2022 in [facility] [place]  PHQ-2 Total Score 0          06/18/2022   10:32 AM 02/19/2022    4:09 PM 05/24/2021   11:54 AM 11/24/2019    2:31 PM 12/04/2015    5:54 PM  Fall Risk   Falls in the past year? 0 1 0 0 No  Number falls in past yr:  0 0    Injury with Fall?  0 0    Risk for fall due to :   Impaired balance/gait;Medication side effect    Follow up   Falls evaluation completed;Education provided;Falls prevention discussed     MRI non contrast brain overall reassuring.  Pending neurology evaluation for memory issues.    Progressive worsening neuropathy presenting as paresthesias to bilateral hands, feet, even groin, scalp, describes burning discomfort to feet. 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  Was taking gabapentin 300mg  TID without significant benefit - states this was changed to pregabalin but he doesn't know dose. I don't have any neurology records - when requested only received NCS report. Upcoming  appt tomorrow with Dr Raylene Miyamoto - states also has upcoming lumbar MRI and arterial circulation evaluation. He states he was tested for hereditary neuropathy by neurology.   Pentoxifylline is prescribed by neurology. Feels this has helped his scalp heal.  Neurology also prescribes namenda 10mg  BID and ritalin 10mg  BID.   Diabetic on metformin 500mg  daily. Lab Results  Component Value Date   HGBA1C 6.2 06/18/2022    Lab Results  Component Value Date   VITAMINB12 660 02/19/2022  No results found for: "FOLATE"  Vitamin B1 8 (low normal) 02/2022 Vitamin B6 - no results found Cr 1.2 GFR 61 (02/2022)   Preventative: COLONOSCOPY WITH PROPOFOL 12/18/2017 - TAx2, HP, diverticulosis, rpt 5 yrs [name], [name], MD)  Prostate cancer screening - sees urology yearly [facility]) Lung cancer screening - not eligible  Flu shot yearly  Pneumovax 2017, prevnar-13 11/2019 COVID vaccine Moderna 03/2019 x2, booster 06/2020. Interested in bivalent booster  Tdap 2017  Shingrix - 10/2018, 02/2019  RSV - discussed Advanced directive planning - does not have this. Would want wife [name] to be HCPOA. Packet provided previously. Planning to hire attorney. Asked to bring [place] a copy.  Seat belt use discussed  Sunscreen use discussed. No changing moles on skin. Sees dermatology Dr Nedra Hai in [place].  Non smoker  Alcohol -  rarely  Dentist q6 mo  Eye exam - yearly [facility] doctor in [place] on [address]  Bowel - no constipation  Bladder - no incontinence   Lives with wife Cornelius, 1 dog Occupation: retired - Sport and exercise psychologist for [employer]  Edu: BS Activity: walking dog  Diet: good water, fruits/vegetables daily      Relevant past medical, surgical, family and social history reviewed and updated as indicated. Interim medical history since our last visit reviewed. Allergies and medications reviewed and updated. Outpatient Medications Prior to Visit  Medication Sig Dispense Refill   albuterol (VENTOLIN HFA) 108 (90  Base) MCG/ACT inhaler TAKE 2 PUFFS BY MOUTH EVERY 6 HOURS AS NEEDED FOR WHEEZE OR SHORTNESS OF BREATH 18 each 1   aspirin 81 MG chewable tablet Chew 1 tablet (81 mg total) by mouth daily.     ibuprofen (ADVIL,MOTRIN) 200 MG tablet Take 400 mg by mouth every 6 (six) hours as needed for moderate pain.     loratadine (CLARITIN) 10 MG tablet Take 10 mg by mouth daily.     memantine (NAMENDA) 10 MG tablet Take 1 tablet (10 mg total) by mouth 2 (two) times daily. For memory     methylphenidate (RITALIN) 10 MG tablet Take 1 tablet (10 mg total) by mouth 2 (two) times daily.     pentoxifylline (TRENTAL) 400 MG CR tablet Take 400 mg by mouth 3 (three) times daily.     pregabalin (LYRICA) 100 MG capsule PLEASE SEE ATTACHED FOR DETAILED DIRECTIONS     tadalafil (CIALIS) 5 MG tablet Take 5 mg by mouth daily as needed for erectile dysfunction.     tamsulosin (FLOMAX) 0.4 MG CAPS capsule Take 1 capsule (0.4 mg total) by mouth daily. 90 capsule 3   terbinafine (LAMISIL) 250 MG tablet Take 1 tablet (250 mg total) by mouth daily. 90 tablet 0   triamcinolone cream (KENALOG) 0.1 % Apply topically as needed. 30 g 0   Vitamin D, Ergocalciferol, (DRISDOL) 1.25 MG (50000 UNIT) CAPS capsule Take 50,000 Units by mouth once a week.     allopurinol (ZYLOPRIM) 300 MG tablet Take 1 tablet (300 mg total) by mouth daily. 90 tablet 3   atorvastatin (LIPITOR) 40 MG tablet Take 1 tablet (40 mg total) by mouth daily. 90 tablet 0   metFORMIN (GLUCOPHAGE) 500 MG tablet Take 1 tablet (500 mg total) by mouth daily with breakfast. 90 tablet 3   vitamin B-12 (CYANOCOBALAMIN) 1000 MCG tablet Take 1 tablet (1,000 mcg total) by mouth daily. (Patient not taking: Reported on 06/18/2022)     cyanocobalamin (VITAMIN B12) 1000 MCG/ML injection every 30 (thirty) days. (Patient not taking: Reported on 06/18/2022)     gabapentin (NEURONTIN) 300 MG capsule Take 1 capsule by mouth 3 (three) times daily. With meals     thiamine (VITAMIN B1) 100 MG tablet  Take 1 tablet (100 mg total) by mouth daily. (Patient not taking: Reported on 06/18/2022)     No facility-administered medications prior to visit.     Per HPI unless specifically indicated in ROS section below Review of Systems  Objective:  BP 124/82   Pulse 72   Temp (!) 97.3 F (36.3 C) (Temporal)   Ht 5' 9.5" (1.765 m)   Wt 232 lb (105.2 kg)   SpO2 96%   BMI 33.77 kg/m   Wt Readings from Last 3 Encounters:  06/18/22 232 lb (105.2 kg)  02/19/22 234 lb 6 oz (106.3 kg)  01/16/22 230 lb 6 oz (104.5  kg)      Physical Exam Vitals and nursing note reviewed.  Constitutional:      General: He is not in acute distress.    Appearance: Normal appearance. He is well-developed. He is not ill-appearing.  HENT:     Head: Normocephalic and atraumatic.     Right Ear: Hearing, tympanic membrane, ear canal and external ear normal.     Left Ear: Hearing, tympanic membrane, ear canal and external ear normal.     Nose: Nose normal.     Mouth/Throat:     Mouth: Mucous membranes are moist.     Pharynx: Oropharynx is clear. No oropharyngeal exudate or posterior oropharyngeal erythema.  Eyes:     General: No scleral icterus.    Extraocular Movements: Extraocular movements intact.     Conjunctiva/sclera: Conjunctivae normal.     Pupils: Pupils are equal, round, and reactive to light.  Neck:     Thyroid: No thyroid mass or thyromegaly.     Vascular: No carotid bruit.  Cardiovascular:     Rate and Rhythm: Normal rate and regular rhythm.     Pulses: Normal pulses.          Radial pulses are 2+ on the right side and 2+ on the left side.     Heart sounds: Normal heart sounds. No murmur heard. Pulmonary:     Effort: Pulmonary effort is normal. No respiratory distress.     Breath sounds: Normal breath sounds. No wheezing, rhonchi or rales.  Abdominal:     General: Bowel sounds are normal. There is no distension.     Palpations: Abdomen is soft. There is no mass.     Tenderness: There is no  abdominal tenderness. There is no guarding or rebound.     Hernia: No hernia is present.  Musculoskeletal:        General: Normal range of motion.     Cervical back: Normal range of motion and neck supple.     Right lower leg: No edema.     Left lower leg: No edema.  Lymphadenopathy:     Cervical: No cervical adenopathy.  Skin:    General: Skin is warm and dry.     Findings: No rash.  Neurological:     General: No focal deficit present.     Mental Status: He is alert and oriented to person, place, and time.     Comments:  Recall 3/3 Calculation DLROW 5/5  Psychiatric:        Mood and Affect: Mood normal.        Behavior: Behavior normal.        Thought Content: Thought content normal.        Judgment: Judgment normal.       Results for orders placed or performed in visit on 06/18/22  Kappa/lambda light chains  Result Value Ref Range   Kappa free light chain 25.4 (H) 3.3 - 19.4 mg/L   Lambda Free Lght Chn 18.6 5.7 - 26.3 mg/L   Kappa:Lambda Ratio 1.37 0.26 - 1.65  Protein, Total and Protein Electrophoresis, Random Urine (REFL)  Result Value Ref Range   Creatinine, Urine 95 20 - 320 mg/dL   Protein/Creat Ratio 95 25 - 148 mg/g creat   Protein/Creatinine Ratio 0.095 0.025 - 0.148 mg/mg creat   Total Protein, Urine 9 5 - 25 mg/dL   Albumin  %   [id number], U NOTE %   Alpha-2-Globulin, U NOTE %   Beta Globulin, U NOTE %  Gamma Globulin, U NOTE %   Interpretation    Vitamin B1  Result Value Ref Range   Vitamin B1 (Thiamine) 8 8 - 30 nmol/L  Uric acid  Result Value Ref Range   Uric Acid, Serum 5.5 4.0 - 7.8 mg/dL  CBC with Differential/Platelet  Result Value Ref Range   WBC 5.3 4.0 - 10.5 K/uL   RBC 5.04 4.22 - 5.81 Mil/uL   Hemoglobin 15.8 13.0 - 17.0 g/dL   HCT 29.5 62.1 - 30.8 %   MCV 93.8 78.0 - 100.0 fl   MCHC 33.5 30.0 - 36.0 g/dL   RDW 65.7 84.6 - 96.2 %   Platelets 199.0 150.0 - 400.0 K/uL   Neutrophils Relative % 52.7 43.0 - 77.0 %   Lymphocytes  Relative 29.8 12.0 - 46.0 %   Monocytes Relative 13.4 (H) 3.0 - 12.0 %   Eosinophils Relative 3.4 0.0 - 5.0 %   Basophils Relative 0.7 0.0 - 3.0 %   Neutro Abs 2.8 1.4 - 7.7 K/uL   Lymphs Abs 1.6 0.7 - 4.0 K/uL   Monocytes Absolute 0.7 0.1 - 1.0 K/uL   Eosinophils Absolute 0.2 0.0 - 0.7 K/uL   Basophils Absolute 0.0 0.0 - 0.1 K/uL  Comprehensive metabolic panel  Result Value Ref Range   Sodium 140 135 - 145 mEq/L   Potassium 4.1 3.5 - 5.1 mEq/L   Chloride 103 96 - 112 mEq/L   CO2 30 19 - 32 mEq/L   Glucose, Bld 116 (H) 70 - 99 mg/dL   BUN 15 6 - 23 mg/dL   Creatinine, Ser 9.52 0.40 - 1.50 mg/dL   Total Bilirubin 0.9 0.2 - 1.2 mg/dL   Alkaline Phosphatase 81 39 - 117 U/L   AST 24 0 - 37 U/L   ALT 22 0 - 53 U/L   Total Protein 7.0 6.0 - 8.3 g/dL   Albumin 4.3 3.5 - 5.2 g/dL   GFR 84.13 >24.40 mL/min   Calcium 9.4 8.4 - 10.5 mg/dL  Lipid panel  Result Value Ref Range   Cholesterol 120 0 - 200 mg/dL   Triglycerides 10.2 0.0 - 149.0 mg/dL   HDL 72.53 >66.44 mg/dL   VLDL 03.4 0.0 - 74.2 mg/dL   LDL Cholesterol 63 0 - 99 mg/dL   Total CHOL/HDL Ratio 3    NonHDL 78.27   Microalbumin / creatinine urine ratio  Result Value Ref Range   Microalb, Ur <0.7 0.0 - 1.9 mg/dL   Creatinine,U 59.5 mg/dL   Microalb Creat Ratio 0.8 0.0 - 30.0 mg/g  Hemoglobin A1c  Result Value Ref Range   Hgb A1c MFr Bld 6.2 4.6 - 6.5 %  PSA, Medicare  Result Value Ref Range   PSA 1.60 0.10 - 4.00 ng/ml    Assessment & Plan:   Problem List Items Addressed This Visit     Medicare annual wellness visit, subsequent - Primary (Chronic)    I have personally reviewed the Medicare Annual Wellness questionnaire and have noted 1. The patient's medical and social history 2. Their use of alcohol, tobacco or illicit drugs 3. Their current medications and supplements 4. The patient's functional ability including ADL's, fall risks, home safety risks and hearing or visual impairment. Cognitive function has been  assessed and addressed as indicated.  5. Diet and physical activity 6. Evidence for depression or mood disorders The patients weight, height, BMI have been recorded in the chart. I have made referrals, counseling and provided education to the patient based  on review of the above and I have provided the pt with a written personalized care plan for preventive services. Provider list updated.. See scanned questionairre as needed for further documentation. Reviewed preventative protocols and updated unless pt declined.       Advanced directives, counseling/discussion (Chronic)    Advanced directive planning - does not have this. Would want wife [name] to be HCPOA. Packet provided previously. Planning to hire attorney. Asked to bring [place] a copy      Obesity, Class I, BMI 30-34.9    Continue to encourage healthy diet and lifestyle choices for sustainable weight loss.       Gout    Update gout levels on allopurinol 300mg  daily      Type 2 diabetes mellitus with other specified complication (HCC)    Chronic, stable period on metformin 500mg  daily - continue.       Relevant Medications   atorvastatin (LIPITOR) 40 MG tablet   metFORMIN (GLUCOPHAGE) 500 MG tablet   Hyperlipidemia associated with type 2 diabetes mellitus (HCC)    Update FLP on atorvastatin 40mg  daily. The ASCVD Risk score (Arnett DK, et al., 2019) failed to calculate for the following reasons:   The valid total cholesterol range is 130 to 320 mg/dL       Relevant Medications   atorvastatin (LIPITOR) 40 MG tablet   metFORMIN (GLUCOPHAGE) 500 MG tablet   RESOLVED: Polycythemia    H/o this, mild, ?OSA related. Normal on last few checks - will resolve.       OSA (obstructive sleep apnea)    Intolerant to CPAP.      BPH (benign prostatic hyperplasia)    Followed by [facility] urology      Memory deficit    This is also followed by neurology, on namenda and ritalin, will request latest office notes.       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       Relevant Medications   allopurinol (ZYLOPRIM) 300 MG tablet   Peripheral neuropathy    Notes progressive worsening peripheral neuropathy affecting sleep. This is followed by neurology Dr Raylene Miyamoto in [facility]. 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  He states gabapentin was ineffective so now he's taking Lyrica.  I only have access to NCS results from 01/2022.  He states he has upcoming neuro f/u tomorrow - I asked him to ask neuro to fax [place] records and have again requested today.       Relevant Medications   pregabalin (LYRICA) 100 MG capsule   Low serum vitamin B12    Not taking b12 replacement at this time. Will need levels updated.       Other Visit Diagnoses     Special screening for malignant neoplasm of prostate       Relevant Orders   PSA, Medicare (Completed)        Meds ordered this encounter  Medications   allopurinol (ZYLOPRIM) 300 MG tablet    Sig: Take 1 tablet (300 mg total) by mouth daily.    Dispense:  90 tablet    Refill:  4   atorvastatin (LIPITOR) 40 MG tablet    Sig: Take 1 tablet (40 mg total) by mouth daily.    Dispense:  90 tablet    Refill:  0  Refill until cardiology appointment   metFORMIN (GLUCOPHAGE) 500 MG tablet    Sig: Take 1 tablet (500 mg total) by mouth daily with breakfast.    Dispense:  90 tablet    Refill:  4    Orders Placed This Encounter  Procedures   PSA, Medicare    Patient Instructions  Labs today  Sign request for records from neurology Dr Raylene Miyamoto for latest 2 office notes and labs.  You will be due for colonoscopy in December 2024 Check with pharmacy about RSV shot.  Bring [place] copy of your living will when complete.  Return in 6 months for diabetes follow up visit.   Follow up plan: Return in  about 6 months (around 12/18/2022) for annual exam, prior fasting for blood work, follow up visit.  Eustaquio Boyden, MD

## 2022-06-18 NOTE — Assessment & Plan Note (Deleted)
Preventative protocols reviewed and updated unless pt declined. Discussed healthy diet and lifestyle.  

## 2022-06-18 NOTE — Assessment & Plan Note (Signed)

## 2022-06-19 LAB — KAPPA/LAMBDA LIGHT CHAINS: Kappa:Lambda Ratio: 1.37 (ref 0.26–1.65)

## 2022-06-21 LAB — PROTEIN,TOTAL AND PROTEIN ELECTROPHORESIS, RANDOM URINE(REFL)
Creatinine, Urine: 95 mg/dL (ref 20–320)
Protein/Creat Ratio: 95 mg/g creat (ref 25–148)
Protein/Creatinine Ratio: 0.095 mg/mg creat (ref 0.025–0.148)
Total Protein, Urine: 9 mg/dL (ref 5–25)

## 2022-06-21 LAB — KAPPA/LAMBDA LIGHT CHAINS
Kappa free light chain: 25.4 mg/L — ABNORMAL HIGH (ref 3.3–19.4)
Lambda Free Lght Chn: 18.6 mg/L (ref 5.7–26.3)

## 2022-06-21 LAB — VITAMIN B1: Vitamin B1 (Thiamine): 8 nmol/L (ref 8–30)

## 2022-06-26 ENCOUNTER — Ambulatory Visit: Payer: Medicare Other | Admitting: Cardiovascular Disease

## 2022-06-27 ENCOUNTER — Other Ambulatory Visit: Payer: Self-pay | Admitting: Family Medicine

## 2022-06-27 ENCOUNTER — Encounter: Payer: Self-pay | Admitting: Family Medicine

## 2022-06-27 DIAGNOSIS — G6289 Other specified polyneuropathies: Secondary | ICD-10-CM

## 2022-06-27 MED ORDER — METFORMIN HCL 500 MG PO TABS: 500.0000 mg | ORAL_TABLET | Freq: Every day | ORAL | 4 refills | Status: DC

## 2022-06-27 MED ORDER — ATORVASTATIN CALCIUM 40 MG PO TABS: 40.0000 mg | ORAL_TABLET | Freq: Every day | ORAL | 0 refills | Status: DC

## 2022-06-27 MED ORDER — THIAMINE HCL 100 MG PO TABS: 100.0000 mg | ORAL_TABLET | ORAL | Status: DC

## 2022-06-27 MED ORDER — ALLOPURINOL 300 MG PO TABS: 300.0000 mg | ORAL_TABLET | Freq: Every day | ORAL | 4 refills | Status: DC

## 2022-06-27 NOTE — Assessment & Plan Note (Signed)
Update FLP on atorvastatin 40mg daily. °The ASCVD Risk score (Arnett DK, et al., 2019) failed to calculate for the following reasons: °  The valid total cholesterol range is 130 to 320 mg/dL  °

## 2022-06-27 NOTE — Assessment & Plan Note (Addendum)
Appreciate wound care, oncology, dermatology clinic care.

## 2022-06-27 NOTE — Assessment & Plan Note (Signed)
Followed by [facility] urology

## 2022-06-27 NOTE — Assessment & Plan Note (Signed)
H/o this, mild, ?OSA related. Normal on last few checks - will resolve.

## 2022-06-27 NOTE — Assessment & Plan Note (Signed)
Chronic, stable period on metformin 500mg  daily - continue.

## 2022-06-27 NOTE — Assessment & Plan Note (Addendum)
Continue to encourage healthy diet and lifestyle choices for sustainable weight loss  

## 2022-06-27 NOTE — Assessment & Plan Note (Addendum)
Notes progressive worsening peripheral neuropathy affecting sleep. This is followed by neurology Dr Raylene Miyamoto in [facility]. 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  He states gabapentin was ineffective so now he's taking Lyrica.  I only have access to NCS results from 01/2022.  He states he has upcoming neuro f/u tomorrow - I asked him to ask neuro to fax [place] records and have again requested today.

## 2022-06-27 NOTE — Assessment & Plan Note (Addendum)
Not taking b12 replacement at this time. Will need levels updated.

## 2022-06-27 NOTE — Assessment & Plan Note (Addendum)
Intolerant to CPAP 

## 2022-06-27 NOTE — Assessment & Plan Note (Addendum)
This is also followed by neurology, on namenda and ritalin, will request latest office notes.

## 2022-06-27 NOTE — Assessment & Plan Note (Signed)
Update gout levels on allopurinol 300mg  daily

## 2022-08-13 ENCOUNTER — Encounter: Payer: Self-pay | Admitting: Physician Assistant

## 2022-08-13 ENCOUNTER — Ambulatory Visit: Payer: Medicare Other | Attending: Cardiovascular Disease | Admitting: Physician Assistant

## 2022-08-13 NOTE — Progress Notes (Deleted)
Cardiology Office Note    Date:  08/13/2022   ID:  Richard Case, DOB 1955/03/30, MRN [id number]  PCP:  Eustaquio Boyden, MD  Cardiologist:  Lorine Bears, MD  Electrophysiologist:  None   Chief Complaint: Follow up  History of Present Illness:   Richard Case is a 67 y.o. 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   He was evaluated in 2019 for exertional dyspnea with treadmill MPI showing no ischemia or scar with an EF of 55 to 65%.  BP demonstrated a hypertensive response to exercise.  He was noted to have mild PVCs with exercise and in recovery.  Overall, this was a low risk study.  Echo at that time showed an EF of 50 to 55%, mild LVH, grade 1 diastolic dysfunction, normal LV internal cavity size, normal RV systolic function and cavity size, normal size and structure aortic root, and no significant valvular abnormalities.     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  He was evaluated by vascular surgery at [facility] with no intervention indicated.  MRI of the brain in 05/2021 showed no evidence of acute intracranial abnormality.  There was also nonspecific mild marrow signal abnormality within the calvarium, possibly related to post treatment changes and/or artifact, though involvement of tumor was difficult to exclude.  PCP discussed this with the patient.   He was seen in our office in 07/2021 and was without symptoms of frank angina or decompensation.  He did continue to note exertional dyspnea that was more pronounced when walking up an incline.  He reported the symptoms were less noticeable while  in [place], walking more on flat ground.  He requested a referral to pulmonology for his chronic dyspnea.  Coronary CTA in 08/2021 showed dependent bibasilar atelectasis with a calcium score of 285 which was the 68th percentile.  There was 25 to 49% stenosis in the proximal LAD and less than 25% stenosis in OM1.  Medical management was recommended.  He has been evaluated by pulmonology with PFTs showing minimal obstructive airway disease.  He was most recently seen in the office in 01/2022 and was without symptoms of angina or cardiac decompensation.  He was concerned about longstanding lower extremity pain.  He was without symptoms of claudication.  ABI normal bilaterally in 01/2022.  ***   Labs independently reviewed: 06/2022 - A1c 6.2, TC 120, TG 76, HDL 42, LDL 63, potassium 4.1, BUN 15, serum creatinine 1.11, albumin 4.3, AST/ALT normal, Hgb 15.8, PLT 199 02/2022 - TSH normal  Past Medical History:  Diagnosis Date   ADD (attention deficit disorder) 2011   no records of workup; strattera caused urinary retention, not interested in habit forming medication   Atrial ectopy 2011   improved with CPAP, documented by holter   Benign colon polyp ?2014   hyperplastic   BPH (benign prostatic hypertrophy)    per prior PCP records   Diabetes mellitus without complication (HCC)    Elevated blood pressure (not hypertension)    Erectile dysfunction    GERD (gastroesophageal reflux disease)    Gout    Heart murmur longstanding  History of kidney stones 2008, 2017   History of pneumonia 2009   HLD (hyperlipidemia)    Kidney stones    HX   Male erectile dysfunction    Obesity, Class I, BMI 30-34.9    OSA (obstructive sleep apnea) 2010   OSA with stabilization at CPAP 7cm   Peyronie disease    per prior pcp records   Polycythemia    ?OSA related   Prediabetes    Rosacea    per prior pcp records   Seasonal allergic rhinitis    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     Past Surgical History:  Procedure Laterality Date   CARDIOVASCULAR STRESS TEST  07/2017   low risk stress test, hypertensive response (End)   COLONOSCOPY  2009   rec rpt 5 yrs per prior PCP records but no actual report   COLONOSCOPY  03/2012   mild diverticulosis, rpt 5 yrs (Dr Fransisca Connors in [place])   COLONOSCOPY WITH PROPOFOL N/A 12/18/2017   TAx2, HP, diverticulosis, rpt 5 yrs [name], [name], MD)   EXTRACORPOREAL SHOCK WAVE LITHOTRIPSY Right 04/06/2015   Procedure: EXTRACORPOREAL SHOCK WAVE LITHOTRIPSY (ESWL);  Surgeon: Hildred Laser, MD;  Location: [facility] ORS;  Service: Urology;  Laterality: Right;   head surgery     01/2021, squamos cell carcinoma   MOHS SURGERY  2015   SCC of nose   TONSILLECTOMY      Current Medications: No outpatient medications have been marked as taking for the 08/13/22 encounter (Appointment) with Sondra Barges, PA-C.    Allergies:   Strattera [atomoxetine hcl]   Social History   Socioeconomic History   Marital status: Married    Spouse name: Not on file   Number of children: Not on file   Years of education: Not on file   Highest education level: Not on file  Occupational History   Not on file  Tobacco Use   Smoking status: Never   Smokeless tobacco: Never  Vaping Use   Vaping status: Never Used  Substance and Sexual Activity   Alcohol use: Yes    Comment: once a month   Drug use: No   Sexual activity: Not on file  Other Topics Concern   Not on file  Social History Narrative   Lives with wife [place], 1 dog   Occupation: retired - Sport and exercise psychologist for [employer]   Edu: BS   Activity: active in yard   Diet: good water, fruits/vegetables daily   Social Determinants of Health   Financial Resource Strain: Low Risk  (05/24/2021)   Overall Financial Resource Strain (CARDIA)    Difficulty of Paying Living Expenses: Not hard at all  Food Insecurity: Low Risk  (07/01/2022)   [facility]   Food vital sign     Within the past 12 months, you worried that your food would run out before you got money to buy more: Never true    Within the past 12 months, the food you bought just didn't last and you didn't have money to get more. : Never true  Transportation Needs: Not on file (07/01/2022)  Physical Activity: Inactive (05/24/2021)   Exercise Vital Sign    Days of Exercise per Week: 0 days    Minutes of Exercise per Session: 0 min  Stress: No Stress Concern Present (05/24/2021)   [employer] of Occupational Health - Occupational Stress Questionnaire    Feeling of Stress : Not at  all  Social Connections: Unknown (07/11/2021)   [facility]   Social Network    Social Network: Not on file     Family History:  The patient's family history includes CAD in his father; Cancer in his mother; Cancer (age of onset: 75) in his father; Heart Problems in his father; Lung disease in his mother; Prostate cancer in his father. There is no history of Stroke, Diabetes, Kidney disease, Kidney cancer, or Bladder Cancer.  ROS:   12-point review of systems is negative unless otherwise noted in the HPI.   EKGs/Labs/Other Studies Reviewed:    Studies reviewed were summarized above. The additional studies were reviewed today:  ABI 01/25/2022: ABI/TBIToday's ABIToday's TBIPrevious ABIPrevious TBI  +-------+-----------+-----------+------------+------------+  Right 1.29       .84                                  +-------+-----------+-----------+------------+------------+  Left  1.2        .84                                  +-------+-----------+-----------+------------+------------+   Summary:  Right: Resting right ankle-brachial index is within normal range. The  right toe-brachial index is normal.   Left: Resting left ankle-brachial index is within normal range. The left  toe-brachial index is normal.  __________  Coronary CTA 08/16/2021: FINDINGS: Aorta:   Normal size.  No calcifications.  No dissection.   Aortic Valve:  Trileaflet.  No calcifications.   Coronary Arteries:  Normal coronary origin. Left dominance.   RCA is a non-dominant artery that gives rise to PDA and PLA. There is no plaque.   Left main gives rise to LAD and LCX arteries.  LM has no disease.   LAD has calcified plaque in the proximal segment causing mild stenosis (25-49%).   LCX is a dominant artery that gives rise to an OM1 branch and the PDA. There is calcified plaque in the proximal OM1 causing minimal stenosis (<25%).   Other findings:   Normal pulmonary vein drainage into the left atrium.   Normal left atrial appendage without a thrombus.   Normal size of the pulmonary artery.   IMPRESSION: 1. Coronary calcium score of 285. This was 68th percentile for age and sex matched control. 2. Normal coronary origin with right dominance. 3. Calcified plaque in the proximal LAD causing mild stenosis (25-49%). 4. Minimal OM1 stenosis (<25%). 5. CAD-RADS 2. Mild non-obstructive CAD (25-49%). Consider non-atherosclerotic causes of chest pain. Consider preventive therapy and risk factor modification. __________   2D echo 08/05/2017: - Left ventricle: The cavity size was normal. Wall thickness was    increased in a pattern of mild LVH. Systolic function was low    normal to mildly reduced. The estimated ejection fraction was in    the range of 50% to 55%. Doppler parameters are consistent with    abnormal left ventricular relaxation (grade 1 diastolic    dysfunction).  - Right ventricle: The cavity size was normal. Wall thickness was    normal. Systolic function was normal. __________   Treadmill MPI 08/05/2017: Normal exercise myocardial perfusion stress test without ischemia or scar. The left ventricular ejection fraction is normal (55-65%). Blood pressure demonstrated a hypertensive response to exercise. There was no ST segment deviation or T wave  abnormalities noted during  stress. Rare PVC's noted during stress and recovery. This is a low risk study.   EKG:  EKG is ordered today.  The EKG ordered today demonstrates ***  Recent Labs: 02/19/2022: TSH 0.76 06/18/2022: ALT 22; BUN 15; Creatinine, Ser 1.11; Hemoglobin 15.8; Platelets 199.0; Potassium 4.1; Sodium 140  Recent Lipid Panel    Component Value Date/Time   CHOL 120 06/18/2022 1127   CHOL 187 05/04/2013 0000   TRIG 76.0 06/18/2022 1127   TRIG 126 05/04/2013 0000   HDL 42.20 06/18/2022 1127   CHOLHDL 3 06/18/2022 1127   VLDL 15.2 06/18/2022 1127   LDLCALC 63 06/18/2022 1127   LDLCALC 123 05/04/2013 0000   LDLDIRECT 118.0 03/22/2015 0745    PHYSICAL EXAM:    VS:  There were no vitals taken for this visit.  BMI: There is no height or weight on file to calculate BMI.  Physical Exam  Wt Readings from Last 3 Encounters:  06/18/22 232 lb (105.2 kg)  02/19/22 234 lb 6 oz (106.3 kg)  01/16/22 230 lb 6 oz (104.5 kg)     ASSESSMENT & PLAN:   Nonobstructive CAD:  Carotid artery disease: Prior imaging at outside hospital in the summer 2023 showed 50% right ICA stenosis.   HLD: LDL 63 in 06/2022   {Are you ordering a CV Procedure (e.g. stress test, cath, DCCV, TEE, etc)?   Press F2        :[id number]}     Disposition: F/u with Dr. Kirke Corin or an APP in ***.   Medication Adjustments/Labs and Tests Ordered: Current medicines are reviewed at length with the patient today.  Concerns regarding medicines are outlined above. Medication changes, Labs and Tests ordered today are summarized above and listed in the Patient Instructions accessible in Encounters.   Signed, Eula Listen, PA-C 08/13/2022 7:47 AM     [facility] [address] [place], [place] [address] [phone]

## 2022-08-19 ENCOUNTER — Encounter: Payer: Self-pay | Admitting: Pulmonary Disease

## 2022-08-20 ENCOUNTER — Other Ambulatory Visit: Payer: Self-pay | Admitting: Podiatry

## 2022-08-20 ENCOUNTER — Other Ambulatory Visit: Payer: Self-pay | Admitting: Family Medicine

## 2022-08-22 ENCOUNTER — Telehealth: Payer: Self-pay

## 2022-08-22 DIAGNOSIS — R413 Other amnesia: Secondary | ICD-10-CM

## 2022-08-22 DIAGNOSIS — G6289 Other specified polyneuropathies: Secondary | ICD-10-CM

## 2022-08-22 NOTE — Telephone Encounter (Signed)
Sent 90-day refill per Nicolasa Ducking, NP of [facility]-, but pt needs follow up prior to additional refills.   Spoke with pt relaying info above. Verbalizes understanding and will schedule cards f/u.

## 2022-08-22 NOTE — Telephone Encounter (Signed)
Pt is requesting a referral to a new neurology office. States it is to hard to get in contact with anyone at current office or get messages returned. Pt is fed up. Plz advise.

## 2022-08-23 ENCOUNTER — Encounter: Payer: Self-pay | Admitting: *Deleted

## 2022-08-23 NOTE — Addendum Note (Signed)
Addended by: Eustaquio Boyden on: 08/23/2022 11:10 AM   Modules accepted: Orders

## 2022-08-23 NOTE — Telephone Encounter (Addendum)
New referral placed to local neurology in [facility]

## 2022-08-23 NOTE — Telephone Encounter (Signed)
Spoke with pt relaying Dr. G's message.  Pt expresses his thanks.  ?

## 2022-09-22 ENCOUNTER — Other Ambulatory Visit: Payer: Self-pay | Admitting: Family Medicine

## 2022-10-07 NOTE — Progress Notes (Unsigned)
Cardiology Office Note    Date:  10/08/2022   ID:  Richard Case, DOB October 20, 1955, MRN [id number]  PCP:  Eustaquio Boyden, MD  Cardiologist:  Lorine Bears, MD  Electrophysiologist:  None   Chief Complaint: Follow-up  History of Present Illness:   Richard Case is a 67 y.o. 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   He was evaluated in 2019 for exertional dyspnea with treadmill MPI showing no ischemia or scar with an EF of 55 to 65%.  BP demonstrated a hypertensive response to exercise.  He was noted to have mild PVCs with exercise and in recovery.  Overall, this was a low risk study.  Echo at that time showed an EF of 50 to 55%, mild LVH, grade 1 diastolic dysfunction, normal LV internal cavity size, normal RV systolic function and cavity size, normal size and structure aortic root, and no significant valvular abnormalities.     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  He was evaluated by vascular surgery at [facility] with no intervention indicated.  MRI of the brain in 05/2021 showed no evidence of acute intracranial abnormality.  There was also nonspecific mild marrow signal abnormality within the calvarium, possibly related to post treatment changes and/or artifact, though involvement of tumor was difficult to exclude.  PCP discussed this with the patient.   He was seen in our office in 07/2021 and continued to note exertional dyspnea that was more pronounced when walking up an incline.  He reported the symptoms were less noticeable while in [place], walking more on flat ground.   Coronary CTA in 08/2021 showed dependent bibasilar atelectasis with a calcium score of 285 which was the 68th percentile.  There was 25 to 49% stenosis in the proximal LAD and less than 25% stenosis in OM1.  Medical management was recommended.  He has been evaluated by pulmonology with PFTs showing minimal obstructive airway disease.  He was last seen in the office in 01/2022 and reported neuropathic lower extremity discomfort.  Subsequent ABIs were normal bilaterally.  Carotid artery ultrasound in 06/2022 showed 60 to 79% right ICA stenosis and 21 to 39% left ICA stenosis.  Findings were essentially unchanged.  He comes in doing well from a cardiac perspective and is without symptoms of angina or cardiac decompensation.  He remains active at baseline, recently helped his brother work on the roof of his shed.  He does note some upper and lower respiratory congestion.  He has wondered if this is related to sinus drainage.  Chronic dyspnea associated with incline is stable.  He also continues to note peripheral neuropathy.  No nonhealing wounds on the lower extremities.  No dizziness, presyncope, or syncope.  Overall, he feels like he is doing well from a cardiac perspective.   Labs independently reviewed: 06/2022 - A1c 6.2, TC 120, TG 76, HDL 42, LDL 63, potassium 4.1, BUN 15, serum creatinine 1.11, albumin 4.3, AST/ALT normal, Hgb 15.8, PLT 199 02/2022 - TSH normal  Past Medical History:  Diagnosis Date   ADD (attention deficit disorder) 2011   no records of workup; strattera  caused urinary retention, not interested in habit forming medication   Atrial ectopy 2011   improved with CPAP, documented by holter   Benign colon polyp ?2014   hyperplastic   BPH (benign prostatic hypertrophy)    per prior PCP records   Diabetes mellitus without complication (HCC)    Elevated blood pressure (not hypertension)    Erectile dysfunction    GERD (gastroesophageal reflux disease)    Gout    Heart murmur  longstanding   History of kidney stones 2008, 2017   History of pneumonia 2009   HLD (hyperlipidemia)    Kidney stones    HX   Male erectile dysfunction    Obesity, Class I, BMI 30-34.9    OSA (obstructive sleep apnea) 2010   OSA with stabilization at CPAP 7cm   Peyronie disease    per prior pcp records   Polycythemia    ?OSA related   Prediabetes    Rosacea    per prior pcp records   Seasonal allergic rhinitis    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    Past Surgical History:  Procedure Laterality Date   BASAL CELL CARCINOMA EXCISION Right    right ear, 08/2022   CARDIOVASCULAR STRESS TEST  07/2017   low risk stress test, hypertensive response (End)   COLONOSCOPY  2009   rec rpt 5 yrs per prior PCP records but no actual report   COLONOSCOPY  03/2012   mild diverticulosis, rpt 5 yrs (Dr Fransisca Connors in [place])   COLONOSCOPY WITH PROPOFOL N/A 12/18/2017   TAx2, HP, diverticulosis, rpt 5 yrs [name], [name], MD)   EXTRACORPOREAL SHOCK WAVE LITHOTRIPSY Right 04/06/2015   Procedure: EXTRACORPOREAL SHOCK WAVE LITHOTRIPSY (ESWL);  Surgeon: Hildred Laser, MD;  Location: [facility] ORS;  Service: Urology;  Laterality: Right;   head surgery     01/2021, squamos cell carcinoma   MOHS SURGERY  2015   SCC of nose   TONSILLECTOMY      Current Medications: Current Meds  Medication Sig   albuterol (VENTOLIN HFA) 108 (90 Base) MCG/ACT inhaler TAKE 2 PUFFS BY MOUTH EVERY 6 HOURS AS NEEDED FOR WHEEZE OR SHORTNESS OF BREATH   allopurinol (ZYLOPRIM) 300 MG tablet Take 1 tablet (300 mg total) by mouth daily.   aspirin 81 MG chewable tablet Chew 1 tablet (81 mg total) by mouth daily.   atorvastatin (LIPITOR) 40 MG tablet TAKE 1 TABLET BY MOUTH EVERY DAY   ibuprofen (ADVIL,MOTRIN) 200 MG tablet Take 400 mg by mouth every 6 (six) hours as needed for moderate pain.   memantine (NAMENDA) 10 MG tablet Take 1 tablet (10 mg total) by mouth 2 (two) times daily. For memory   metFORMIN  (GLUCOPHAGE) 500 MG tablet Take 1 tablet (500 mg total) by mouth daily with breakfast.   pentoxifylline (TRENTAL) 400 MG CR tablet Take 400 mg by mouth 3 (three) times daily.   pregabalin (LYRICA) 100 MG capsule PLEASE SEE ATTACHED FOR DETAILED DIRECTIONS   tadalafil (CIALIS) 5 MG tablet Take 5 mg by mouth daily as needed for erectile dysfunction.   tamsulosin (FLOMAX) 0.4 MG CAPS capsule Take 1 capsule (0.4 mg total) by mouth daily.   thiamine (VITAMIN B1) 100 MG tablet Take 1 tablet (100 mg total) by mouth once a week.   triamcinolone cream (KENALOG) 0.1 % Apply topically as needed.    Allergies:   Strattera [atomoxetine hcl]   Social History   Socioeconomic History   Marital  status: Married    Spouse name: Not on file   Number of children: Not on file   Years of education: Not on file   Highest education level: Not on file  Occupational History   Not on file  Tobacco Use   Smoking status: Never   Smokeless tobacco: Never  Vaping Use   Vaping status: Never Used  Substance and Sexual Activity   Alcohol use: Yes    Comment: once a month   Drug use: No   Sexual activity: Not on file  Other Topics Concern   Not on file  Social History Narrative   Lives with wife [place], 1 dog   Occupation: retired - Sport and exercise psychologist for [employer]   Edu: BS   Activity: active in yard   Diet: good water, fruits/vegetables daily   Social Determinants of Health   Financial Resource Strain: Low Risk  (05/24/2021)   Overall Financial Resource Strain (CARDIA)    Difficulty of Paying Living Expenses: Not hard at all  Food Insecurity: Low Risk  (07/01/2022)   [facility]   Hunger Vital Sign    Worried About Running Out of Food in the Last Year: Never true    Ran Out of Food in the Last Year: Never true  Transportation Needs: Not on file (07/01/2022)  Physical Activity: Inactive (05/24/2021)   Exercise Vital Sign    Days of Exercise per Week: 0 days    Minutes of  Exercise per Session: 0 min  Stress: No Stress Concern Present (05/24/2021)   [employer] of Occupational Health - Occupational Stress Questionnaire    Feeling of Stress : Not at all  Social Connections: Unknown (07/11/2021)   [facility]   Social Network    Social Network: Not on file     Family History:  The patient's family history includes CAD in his father; Cancer in his mother; Cancer (age of onset: 45) in his father; Heart Problems in his father; Lung disease in his mother; Prostate cancer in his father. There is no history of Stroke, Diabetes, Kidney disease, Kidney cancer, or Bladder Cancer.  ROS:   12-point review of systems is negative unless otherwise noted in the HPI.   EKGs/Labs/Other Studies Reviewed:    Studies reviewed were summarized above. The additional studies were reviewed today:  ABI 01/25/2022: ABI/TBIToday's ABIToday's TBIPrevious ABIPrevious TBI  +-------+-----------+-----------+------------+------------+  Right 1.29       .84                                  +-------+-----------+-----------+------------+------------+  Left  1.2        .84                                  +-------+-----------+-----------+------------+------------+   Summary:  Right: Resting right ankle-brachial index is within normal range. The  right toe-brachial index is normal.   Left: Resting left ankle-brachial index is within normal range. The left  toe-brachial index is normal.  __________  Coronary CTA 08/16/2021: FINDINGS: Aorta:  Normal size.  No calcifications.  No dissection.   Aortic Valve:  Trileaflet.  No calcifications.   Coronary Arteries:  Normal coronary origin. Left dominance.   RCA is a non-dominant artery that gives rise to PDA and PLA. There is no plaque.  Left main gives rise to LAD and LCX arteries.  LM has no disease.   LAD has calcified plaque in the proximal segment causing mild stenosis  (25-49%).   LCX is a dominant artery that gives rise to an OM1 branch and the PDA. There is calcified plaque in the proximal OM1 causing minimal stenosis (<25%).   Other findings:   Normal pulmonary vein drainage into the left atrium.   Normal left atrial appendage without a thrombus.   Normal size of the pulmonary artery.   IMPRESSION: 1. Coronary calcium score of 285. This was 68th percentile for age and sex matched control. 2. Normal coronary origin with right dominance. 3. Calcified plaque in the proximal LAD causing mild stenosis (25-49%). 4. Minimal OM1 stenosis (<25%). 5. CAD-RADS 2. Mild non-obstructive CAD (25-49%). Consider non-atherosclerotic causes of chest pain. Consider preventive therapy and risk factor modification. __________   2D echo 08/05/2017: - Left ventricle: The cavity size was normal. Wall thickness was    increased in a pattern of mild LVH. Systolic function was low    normal to mildly reduced. The estimated ejection fraction was in    the range of 50% to 55%. Doppler parameters are consistent with    abnormal left ventricular relaxation (grade 1 diastolic    dysfunction).  - Right ventricle: The cavity size was normal. Wall thickness was    normal. Systolic function was normal. __________   Treadmill MPI 08/05/2017: Normal exercise myocardial perfusion stress test without ischemia or scar. The left ventricular ejection fraction is normal (55-65%). Blood pressure demonstrated a hypertensive response to exercise. There was no ST segment deviation or T wave abnormalities noted during stress. Rare PVC's noted during stress and recovery. This is a low risk study.   EKG:  EKG is ordered today.  The EKG ordered today demonstrates NSR, 83 bpm, no acute st/t changes  Recent Labs: 02/19/2022: TSH 0.76 06/18/2022: ALT 22; BUN 15; Creatinine, Ser 1.11; Hemoglobin 15.8; Platelets 199.0; Potassium 4.1; Sodium 140  Recent Lipid Panel    Component Value  Date/Time   CHOL 120 06/18/2022 1127   CHOL 187 05/04/2013 0000   TRIG 76.0 06/18/2022 1127   TRIG 126 05/04/2013 0000   HDL 42.20 06/18/2022 1127   CHOLHDL 3 06/18/2022 1127   VLDL 15.2 06/18/2022 1127   LDLCALC 63 06/18/2022 1127   LDLCALC 123 05/04/2013 0000   LDLDIRECT 118.0 03/22/2015 0745    PHYSICAL EXAM:    VS:  BP 110/68 (BP Location: Left Arm, Patient Position: Sitting, Cuff Size: Large)   Pulse 83   Ht 5\' 10"  (1.778 m)   Wt 231 lb 6.4 oz (105 kg)   SpO2 99%   BMI 33.20 kg/m   BMI: Body mass index is 33.2 kg/m.  Physical Exam Vitals reviewed.  Constitutional:      Appearance: He is well-developed.  HENT:     Head: Normocephalic and atraumatic.  Eyes:     General:        Right eye: No discharge.        Left eye: No discharge.  Neck:     Vascular: No JVD.  Cardiovascular:     Rate and Rhythm: Normal rate and regular rhythm.     Pulses:          Posterior tibial pulses are 2+ on the right side and 2+ on the left side.     Heart sounds: Normal heart sounds, S1 normal and S2 normal. Heart sounds not distant.  No midsystolic click and no opening snap. No murmur heard.    No friction rub.  Pulmonary:     Effort: Pulmonary effort is normal. No respiratory distress.     Breath sounds: Normal breath sounds. No decreased breath sounds, wheezing, rhonchi or rales.  Chest:     Chest wall: No tenderness.  Abdominal:     General: There is no distension.  Musculoskeletal:     Cervical back: Normal range of motion.     Right lower leg: No edema.     Left lower leg: No edema.  Skin:    General: Skin is warm and dry.     Nails: There is no clubbing.  Neurological:     Mental Status: He is alert and oriented to person, place, and time.  Psychiatric:        Speech: Speech normal.        Behavior: Behavior normal.        Thought Content: Thought content normal.        Judgment: Judgment normal.     Wt Readings from Last 3 Encounters:  10/08/22 231 lb 6.4 oz (105  kg)  06/18/22 232 lb (105.2 kg)  02/19/22 234 lb 6 oz (106.3 kg)     ASSESSMENT & PLAN:   Nonobstructive CAD: No symptoms suggestive of angina or cardiac decompensation.  Will obtain echo given congestion-like symptoms, if this is reassuring no further cardiac testing indicated at this time with recent coronary CTA approximately 1 year ago showing nonobstructive disease as outlined above.  Continue aggressive risk factor modification and primary prevention including aspirin and atorvastatin.  HLD: LDL 63 in 06/2022 with normal AST/ALT at that time.  Remains on atorvastatin 40 mg.  Carotid artery stenosis: Ultrasound from 06/2022 showed 60 to 79% stenosis in the right ICA with 21 to 39% left ICA stenosis.  Findings were essentially unchanged from prior study.  Remains on aspirin and atorvastatin.  Followed by [facility] vascular surgery.   Disposition: F/u with Dr. Kirke Corin or an APP in 6 months.   Medication Adjustments/Labs and Tests Ordered: Current medicines are reviewed at length with the patient today.  Concerns regarding medicines are outlined above. Medication changes, Labs and Tests ordered today are summarized above and listed in the Patient Instructions accessible in Encounters.   Signed, Eula Listen, PA-C 10/08/2022 4:21 PM     [facility] [place] [address] [place], [place] [address] [phone]

## 2022-10-08 ENCOUNTER — Encounter: Payer: Self-pay | Admitting: Physician Assistant

## 2022-10-08 ENCOUNTER — Ambulatory Visit: Payer: Medicare Other | Attending: Cardiovascular Disease | Admitting: Physician Assistant

## 2022-10-08 VITALS — BP 110/68 | HR 83 | Ht 70.0 in | Wt 231.4 lb

## 2022-10-08 DIAGNOSIS — I2510 Atherosclerotic heart disease of native coronary artery without angina pectoris: Secondary | ICD-10-CM | POA: Diagnosis not present

## 2022-10-08 DIAGNOSIS — I6523 Occlusion and stenosis of bilateral carotid arteries: Secondary | ICD-10-CM | POA: Diagnosis not present

## 2022-10-08 DIAGNOSIS — E785 Hyperlipidemia, unspecified: Secondary | ICD-10-CM | POA: Insufficient documentation

## 2022-10-08 NOTE — Patient Instructions (Signed)
Medication Instructions:  Your Physician recommend you continue on your current medication as directed.    *If you need a refill on your cardiac medications before your next appointment, please call your pharmacy*   Lab Work: None If you have labs (blood work) drawn today and your tests are completely normal, you will receive your results only by: MyChart Message (if you have MyChart) OR A paper copy in the mail If you have any lab test that is abnormal or we need to change your treatment, we will call you to review the results.   Testing/Procedures: Your physician has requested that you have an echocardiogram. Echocardiography is a painless test that uses sound waves to create images of your heart. It provides your doctor with information about the size and shape of your heart and how well your heart's chambers and valves are working.   You may receive an ultrasound enhancing agent through an IV if needed to better visualize your heart during the echo. This procedure takes approximately one hour.  There are no restrictions for this procedure.  This will take place at [address] [address], [place] [address]    Follow-Up: At [facility], you and your health needs are our priority.  As part of our continuing mission to provide you with exceptional heart care, we have created designated Provider Care Teams.  These Care Teams include your primary Cardiologist (physician) and Advanced Practice Providers (APPs -  Physician Assistants and Nurse Practitioners) who all work together to provide you with the care you need, when you need it.  We recommend signing up for the patient portal called "MyChart".  Sign up information is provided on this After Visit Summary.  MyChart is used to connect with patients for Virtual Visits (Telemedicine).  Patients are able to view lab/test results, encounter notes, upcoming appointments, etc.  Non-urgent messages can be sent  to your provider as well.   To learn more about what you can do with MyChart, go to [email or web address].    Your next appointment:   6 month(s)  Provider:   You may see Lorine Bears, MD or one of the following Advanced Practice Providers on your designated Care Team:   Eula Listen, [place]

## 2022-10-21 ENCOUNTER — Ambulatory Visit: Payer: Medicare Other | Attending: Physician Assistant

## 2022-10-21 DIAGNOSIS — I2510 Atherosclerotic heart disease of native coronary artery without angina pectoris: Secondary | ICD-10-CM | POA: Diagnosis not present

## 2022-10-21 LAB — ECHOCARDIOGRAM COMPLETE
AR max vel: 3.95 cm2
AV Area VTI: 4.01 cm2
AV Area mean vel: 3.88 cm2
AV Mean grad: 3 mm[Hg]
AV Peak grad: 6.1 mm[Hg]
Ao pk vel: 1.23 m/s
Area-P 1/2: 3.65 cm2
Calc EF: 50.6 %
S' Lateral: 4.2 cm
Single Plane A2C EF: 50.1 %
Single Plane A4C EF: 49.9 %

## 2022-11-19 ENCOUNTER — Ambulatory Visit: Payer: Medicare Other | Admitting: Urology

## 2022-11-19 ENCOUNTER — Encounter: Payer: Self-pay | Admitting: Urology

## 2022-11-19 VITALS — BP 144/78 | HR 102 | Ht 70.0 in | Wt 230.0 lb

## 2022-11-19 DIAGNOSIS — Z8042 Family history of malignant neoplasm of prostate: Secondary | ICD-10-CM | POA: Diagnosis not present

## 2022-11-19 DIAGNOSIS — N529 Male erectile dysfunction, unspecified: Secondary | ICD-10-CM | POA: Diagnosis not present

## 2022-11-19 DIAGNOSIS — N5314 Retrograde ejaculation: Secondary | ICD-10-CM

## 2022-11-19 DIAGNOSIS — N401 Enlarged prostate with lower urinary tract symptoms: Secondary | ICD-10-CM

## 2022-11-20 LAB — PSA: Prostate Specific Ag, Serum: 1.6 ng/mL (ref 0.0–4.0)

## 2022-11-24 ENCOUNTER — Other Ambulatory Visit: Payer: Self-pay | Admitting: Family Medicine

## 2022-12-04 ENCOUNTER — Other Ambulatory Visit: Payer: Self-pay | Admitting: Urology

## 2022-12-04 DIAGNOSIS — N401 Enlarged prostate with lower urinary tract symptoms: Secondary | ICD-10-CM

## 2022-12-06 ENCOUNTER — Encounter: Payer: Self-pay | Admitting: Family Medicine

## 2022-12-09 ENCOUNTER — Encounter: Payer: Self-pay | Admitting: Pulmonary Disease

## 2022-12-09 ENCOUNTER — Ambulatory Visit: Payer: Medicare Other | Admitting: Pulmonary Disease

## 2022-12-09 VITALS — BP 110/70 | HR 89 | Temp 97.1°F | Ht 70.0 in | Wt 237.4 lb

## 2022-12-09 DIAGNOSIS — J329 Chronic sinusitis, unspecified: Secondary | ICD-10-CM | POA: Diagnosis not present

## 2022-12-09 DIAGNOSIS — R053 Chronic cough: Secondary | ICD-10-CM

## 2022-12-09 DIAGNOSIS — R058 Other specified cough: Secondary | ICD-10-CM | POA: Diagnosis not present

## 2022-12-09 DIAGNOSIS — J683 Other acute and subacute respiratory conditions due to chemicals, gases, fumes and vapors: Secondary | ICD-10-CM

## 2022-12-09 DIAGNOSIS — Z23 Encounter for immunization: Secondary | ICD-10-CM | POA: Diagnosis not present

## 2022-12-09 LAB — NITRIC OXIDE: Nitric Oxide: 17

## 2022-12-09 MED ORDER — AIRSUPRA 90-80 MCG/ACT IN AERO: 2.0000 | INHALATION_SPRAY | RESPIRATORY_TRACT | 0 refills | Status: DC | PRN

## 2022-12-09 MED ORDER — AIRSUPRA 90-80 MCG/ACT IN AERO: 2.0000 | INHALATION_SPRAY | Freq: Four times a day (QID) | RESPIRATORY_TRACT | 6 refills | Status: DC | PRN

## 2022-12-09 NOTE — Progress Notes (Unsigned)
Subjective:    Patient ID: Richard Case, male    DOB: 1955/03/05, 67 y.o.   MRN: [id number]  Patient Care Team: Eustaquio Boyden, MD as PCP - General (Family Medicine) Iran Ouch, MD as PCP - Cardiology (Cardiology) Salena Saner, MD as Consulting Physician (Pulmonary Disease)  Chief Complaint  Patient presents with   Follow-up    DOE. Wheezing this morning. Occasional coughing spell with clear sputum.     BACKGROUND/INTERVAL:Patient is a 67 year old lifelong never smoker who follows here for the issue of a chronic cough. He was initially evaluated here on 11 October 2021. At that time it appear that his cough was related mostly to upper airway cough syndrome (postnasal drip). PFTs did not show significant abnormality except perhaps some very mild restriction due to obesity (ERV 28%) and minimal airway obstruction consistent with reactive airways. Since his initial evaluation the patient has engaged in weight loss which has helped him with perceived dyspnea on exertion while going up hills or stairs. Also has noted that his cough has improved following nasal hygiene and using Zyrtec at bedtime.  Most recent follow-up was on 15 January 2022 at that time he was instructed to continue as needed albuterol for reactive airways and continue nasal hygiene.  No exacerbations since his last visit.  HPI Discussed the use of AI scribe software for clinical note transcription with the patient, who gave verbal consent to proceed.  History of Present Illness   The patient, with a history of allergies to dogs and cats, presents with a recurrent cough. The cough has improved since the last visit but still occurs in spells, particularly when laughing. The patient also reports occasional wheezing, which is audible at times.  The patient has noticed some nasal congestion, but it is not as severe as before. He attributes this improvement to a decrease in nasal drainage. The patient lives with a  hypoallergenic dog and has recently lost a cat. Despite being allergic to both animals, the patient reports tolerating the current pet well.  The patient enjoys woodturning but has noticed that sawdust exacerbates his symptoms. To mitigate this, he has started wearing a mask and shield during this activity, which has led to some improvement.  The patient also mentions concerns about potential mold exposure. He often works with wood found outdoors, which he acknowledges likely has mold. Additionally, the patient's spouse suspects there may be mold in their home. However, due to financial constraints, he feels unable to address this potential issue.  The patient has been managing his symptoms with as needed albuterol inhaler but does not always have it readily available.  He uses this inhaler very rarely.  He expresses a desire to have a refill on hand to manage his symptoms as needed.      DATA 10/31/2021 IgE/allergen panel: IgE 30, Allergen panel negative. 11/27/2021 PFTs: FEV1 2.75 L or 81% predicted, FVC 3.92 L or 86% predicted, FEV1/FVC 70% mild restriction on the basis of obesity (ERV 28%) diffusion capacity normal. Overall unremarkable PFTs with minimal airway obstruction  Review of Systems A 10 point review of systems was performed and it is as noted above otherwise negative.   Patient Active Problem List   Diagnosis Date Noted   Peripheral neuropathy 02/19/2022   Low serum vitamin B12 02/19/2022   Upper airway cough syndrome 09/28/2021   Carotid stenosis 06/12/2021   Advanced directives, counseling/discussion 06/06/2021   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 04/17/2021  Memory deficit 04/16/2021   Chronic cough 06/28/2020   Burnout of caregiver 11/24/2019   Medicare annual wellness visit, subsequent 11/24/2019   Sensorineural hearing loss, bilateral 12/12/2018   Tinnitus 10/29/2017   Exertional dyspnea 07/23/2017   Radon exposure 04/04/2016   Complex renal cyst  06/03/2015   Hydronephrosis, right 04/19/2015   History of kidney stones 04/04/2015   Peyronie disease 04/04/2015   Health maintenance examination 03/28/2015   BPH (benign prostatic hyperplasia) 03/28/2015   Hyperlipidemia associated with type 2 diabetes mellitus (HCC)    Erectile dysfunction    OSA (obstructive sleep apnea)    Obesity, Class I, BMI 30-34.9    Seasonal allergic rhinitis    Gout    Type 2 diabetes mellitus with other specified complication (HCC)     Social History   Tobacco Use   Smoking status: Never   Smokeless tobacco: Never  Substance Use Topics   Alcohol use: Yes    Comment: once a month    Allergies  Allergen Reactions   Strattera [Atomoxetine Hcl] Other (See Comments)    Urinary retention    Current Meds  Medication Sig   albuterol (VENTOLIN HFA) 108 (90 Base) MCG/ACT inhaler TAKE 2 PUFFS BY MOUTH EVERY 6 HOURS AS NEEDED FOR WHEEZE OR SHORTNESS OF BREATH   allopurinol (ZYLOPRIM) 300 MG tablet Take 1 tablet (300 mg total) by mouth daily.   aspirin 81 MG chewable tablet Chew 1 tablet (81 mg total) by mouth daily.   atorvastatin (LIPITOR) 40 MG tablet TAKE 1 TABLET BY MOUTH EVERY DAY   ibuprofen (ADVIL,MOTRIN) 200 MG tablet Take 400 mg by mouth every 6 (six) hours as needed for moderate pain.   loratadine (CLARITIN) 10 MG tablet Take 10 mg by mouth daily.   memantine (NAMENDA) 10 MG tablet Take 1 tablet (10 mg total) by mouth 2 (two) times daily. For memory   metFORMIN (GLUCOPHAGE) 500 MG tablet Take 1 tablet (500 mg total) by mouth daily with breakfast.   pentoxifylline (TRENTAL) 400 MG CR tablet Take 400 mg by mouth 3 (three) times daily.   pregabalin (LYRICA) 100 MG capsule PLEASE SEE ATTACHED FOR DETAILED DIRECTIONS   tadalafil (CIALIS) 5 MG tablet Take 5 mg by mouth daily as needed for erectile dysfunction.   tamsulosin (FLOMAX) 0.4 MG CAPS capsule TAKE 1 CAPSULE BY MOUTH EVERY DAY   terbinafine (LAMISIL) 250 MG tablet Take 1 tablet (250 mg total)  by mouth daily.   thiamine (VITAMIN B1) 100 MG tablet Take 1 tablet (100 mg total) by mouth once a week.   triamcinolone cream (KENALOG) 0.1 % Apply topically as needed.   Vitamin D, Ergocalciferol, (DRISDOL) 1.25 MG (50000 UNIT) CAPS capsule Take 50,000 Units by mouth once a week.    Immunization History  Administered Date(s) Administered   Influenza Inj Mdck Quad Pf 01/15/2017   Influenza, High Dose Seasonal PF 11/08/2019   Influenza,inj,Quad PF,6+ Mos 01/27/2015, 10/29/2017, 11/03/2018   Influenza-Unspecified 12/15/2015   MODERNA COVID-19 SARS-COV-2 PEDS BIVALENT BOOSTER 83yr-59yr 03/18/2019, 04/14/2019, 06/16/2020   Pneumococcal Conjugate-13 11/24/2019   Pneumococcal Polysaccharide-23 01/27/2015   Rabies, IM 08/17/2014, 08/20/2014, 08/24/2014, 09/02/2014, 09/14/2014   Tdap 03/28/2015   Zoster Recombinant(Shingrix) 11/03/2018, 02/25/2019        Objective:     BP 110/70 (BP Location: Right Arm, Cuff Size: Normal)   Pulse 89   Temp (!) 97.1 F (36.2 C)   Ht 5\' 10"  (1.778 m)   Wt 237 lb 6.4 oz (107.7 kg)  SpO2 97%   BMI 34.06 kg/m   SpO2: 97 % O2 Device: None (Room air)  GENERAL: Well-developed, obese gentleman, no acute distress, fully ambulatory, no conversational dyspnea. HEAD: Normocephalic, atraumatic.  Scalp scar from prior squamous cell carcinoma resection well-healed. EYES: Pupils equal, round, reactive to light.  No scleral icterus.  MOUTH: Dentition intact, no thrush.  Oral mucosa moist. NECK: Supple. No thyromegaly. Trachea midline. No JVD.  No adenopathy. PULMONARY: Good air entry bilaterally.  No adventitious sounds. CARDIOVASCULAR: S1 and S2. Regular rate and rhythm.  No rubs, murmurs or gallops heard. ABDOMEN: Obese otherwise benign. MUSCULOSKELETAL: No joint deformity, no clubbing, no edema.  NEUROLOGIC: No overt focal deficit, no gait disturbance, speech is fluent. SKIN: Intact,warm,dry.  Scar in the scalp posteriorly (Moh's Surgery) PSYCH: Mood and  behavior normal  Lab Results  Component Value Date   NITRICOXIDE 17 12/09/2022   Assessment & Plan:     ICD-10-CM   1. Reactive airways dysfunction syndrome (HCC)  J68.3     2. Chronic cough  R05.3 Nitric oxide    3. 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 Need for influenza vaccination  Z23       Orders Placed This Encounter  Procedures   Nitric oxide   Discussion:    Recurrent Cough Intermittent cough episodes, less severe than previous visit. Possible triggers include nasal congestion, allergen exposure (dogs, mold), and environmental factors (sawdust). Occasional morning wheezing. Low airway inflammation (FeNO 17 ppb) confirmed. Discussed using a rescue inhaler with anti-inflammatory properties (AirSupra). Emphasized importance of having the inhaler readily available. Patient prefers minimal medication use. - Provide sample of rescue inhaler with anti-inflammatory properties (AirSupra) - Instruct to use inhaler q 6 hours as needed for wheezing or persistent cough   Allergic Rhinitis Nasal congestion improved. Allergic to dogs and cats, currently exposed to a hypoallergenic dog. Possible mold exposure at home and during woodturning activities. Discussed wearing a mask and shield during woodturning to reduce exposure to sawdust and mold. Patient acknowledges economic constraints for home mold remediation. - Advise wearing mask and shield during woodturning - Recommend performing wood turning activities outside - Discuss potential mold presence in home and economic constraints for remediation  General Health Maintenance No flu shot received this season. Patient agreed to receive the flu shot today. - Administer flu shot today  Follow-up - Schedule follow-up appointment in four months.      Gailen Shelter, MD Advanced Bronchoscopy PCCM [facility]    *This note was generated using voice recognition software/Dragon and/or AI transcription program.   Despite best efforts to proofread, errors can occur which can change the meaning. Any transcriptional errors that result from this process are unintentional and may not be fully corrected at the time of dictation.

## 2022-12-09 NOTE — Patient Instructions (Signed)
VISIT SUMMARY:  During today's visit, we discussed your recurrent cough, nasal congestion, and potential allergen exposures. We also reviewed your general health maintenance and administered a flu shot.  YOUR PLAN:  -RECURRENT COUGH: A recurrent cough is a cough that comes and goes over time. Your cough has improved but still occurs in spells, especially when laughing, and you also experience occasional wheezing. We discussed using a rescue inhaler with anti-inflammatory properties (AirSupra) to manage your symptoms. Please use the inhaler 3-4 times a day as needed for wheezing or persistent cough. Rinse mouth well after use. We will also check your insurance coverage for this inhaler.  -ALLERGIC RHINITIS: Allergic rhinitis is inflammation of the nasal passages caused by allergens. Your nasal congestion has improved, but you are still exposed to potential allergens like dogs and mold. We recommend wearing a mask and shield during woodturning to reduce exposure to sawdust and mold. Additionally, try to perform wood cleaning activities outside. We understand the economic constraints for home mold remediation.  -GENERAL HEALTH MAINTENANCE: We reviewed your general health maintenance and noted that you had not received a flu shot this season. We administered the flu shot today to help protect you from the flu.  INSTRUCTIONS:  Please schedule a follow-up appointment in four months.

## 2022-12-18 ENCOUNTER — Ambulatory Visit: Payer: Medicare Other | Admitting: Family Medicine

## 2022-12-25 ENCOUNTER — Encounter: Payer: Self-pay | Admitting: Family Medicine

## 2022-12-25 ENCOUNTER — Ambulatory Visit: Payer: Medicare Other | Admitting: Family Medicine

## 2022-12-25 ENCOUNTER — Telehealth: Payer: Self-pay | Admitting: Family Medicine

## 2022-12-25 VITALS — BP 132/70 | HR 80 | Temp 97.6°F | Ht 70.0 in | Wt 235.2 lb

## 2022-12-25 DIAGNOSIS — G6289 Other specified polyneuropathies: Secondary | ICD-10-CM | POA: Diagnosis not present

## 2022-12-25 DIAGNOSIS — F909 Attention-deficit hyperactivity disorder, unspecified type: Secondary | ICD-10-CM

## 2022-12-25 DIAGNOSIS — E1169 Type 2 diabetes mellitus with other specified complication: Secondary | ICD-10-CM

## 2022-12-25 DIAGNOSIS — D369 Benign neoplasm, unspecified site: Secondary | ICD-10-CM

## 2022-12-25 DIAGNOSIS — R413 Other amnesia: Secondary | ICD-10-CM | POA: Diagnosis not present

## 2022-12-25 DIAGNOSIS — E538 Deficiency of other specified B group vitamins: Secondary | ICD-10-CM | POA: Diagnosis not present

## 2022-12-25 LAB — POCT GLYCOSYLATED HEMOGLOBIN (HGB A1C): Hemoglobin A1C: 6 % — AB (ref 4.0–5.6)

## 2022-12-25 NOTE — Telephone Encounter (Signed)
Patient asked if he could be referred for colonoscopy states he has not had one in a while and that he had discussed with provider on another visit

## 2022-12-25 NOTE — Progress Notes (Signed)
Ph: [phone] Fax: [phone]   Patient ID: Richard Case, male    DOB: Jul 07, 1955, 67 y.o.   MRN: [id number]  This visit was conducted in person.  BP 132/70   Pulse 80   Temp 97.6 F (36.4 C) (Oral)   Ht 5\' 10"  (1.778 m)   Wt 235 lb 4 oz (106.7 kg)   SpO2 96%   BMI 33.75 kg/m    CC: 6 mo DM f/u visit  Subjective:   HPI: Richard Case is a 67 y.o. male presenting on 12/25/2022 for Medical Management of Chronic Issues (Here for 6 mo DM f/u.)   Since last seen, has seen cardiologist and [facility], as well as Dr Jayme Cloud started on Airsupra for recurrent cough in reactive airway dysfunction syndrome - sample didn't help much and Rx was too expensive so he didn't fill. Records reviewed.   Brother recently found to have elevated PSA pending workup - pt's PSA has been normal and is followed by urology.   Previously saw neurology Dr Raylene Miyamoto in [facility]. He was treated with memantine 10mg  bid as well as Ritalin for possible ADHD dx, noted improvement. He also started him on pentoxifylline for circulation.  Pending [facility] neurology 2nd opinion appt tomorrow.  Considering seeing neurosurgery Dr Jordan Likes.   DM - does regularly check sugars daily, fasting 130+ more recently. Compliant with antihyperglycemic regimen which includes: metformin 500mg  daily. Denies low sugars or hypoglycemic symptoms. + paresthesias from chronic neuropathy, no blurry vision. Last diabetic eye exam 10/2021 - overdue for f/u. Found to have double vision due to eye fatigue. Glucometer brand: unknown. Last foot exam: due. DSME: has not completed this.  Lab Results  Component Value Date   HGBA1C 6.0 (A) 12/25/2022   Diabetic Foot Exam - Simple   Simple Foot Form Diabetic Foot exam was performed with the following findings: Yes 12/25/2022  3:40 PM  Visual Inspection No deformities, no ulcerations, no other skin breakdown bilaterally: Yes Sensation Testing See comments: Yes Pulse Check Posterior Tibialis  and Dorsalis pulse intact bilaterally: Yes Comments Diminished monofilament testing to soles bilaterally No claudication    Lab Results  Component Value Date   MICROALBUR <0.7 06/18/2022    Lab Results  Component Value Date   VITAMINB12 660 02/19/2022       Relevant past medical, surgical, family and social history reviewed and updated as indicated. Interim medical history since our last visit reviewed. Allergies and medications reviewed and updated. Outpatient Medications Prior to Visit  Medication Sig Dispense Refill   albuterol (VENTOLIN HFA) 108 (90 Base) MCG/ACT inhaler TAKE 2 PUFFS BY MOUTH EVERY 6 HOURS AS NEEDED FOR WHEEZE OR SHORTNESS OF BREATH 18 each 1   allopurinol (ZYLOPRIM) 300 MG tablet Take 1 tablet (300 mg total) by mouth daily. 90 tablet 4   aspirin 81 MG chewable tablet Chew 1 tablet (81 mg total) by mouth daily.     atorvastatin (LIPITOR) 40 MG tablet TAKE 1 TABLET BY MOUTH EVERY DAY 90 tablet 0   ibuprofen (ADVIL,MOTRIN) 200 MG tablet Take 400 mg by mouth every 6 (six) hours as needed for moderate pain.     loratadine (CLARITIN) 10 MG tablet Take 10 mg by mouth daily.     memantine (NAMENDA) 10 MG tablet Take 1 tablet (10 mg total) by mouth 2 (two) times daily. For memory     metFORMIN (GLUCOPHAGE) 500 MG tablet Take 1 tablet (500 mg total) by mouth daily with breakfast. 90 tablet 4  pentoxifylline (TRENTAL) 400 MG CR tablet Take 400 mg by mouth 3 (three) times daily.     pregabalin (LYRICA) 100 MG capsule PLEASE SEE ATTACHED FOR DETAILED DIRECTIONS     tadalafil (CIALIS) 5 MG tablet Take 5 mg by mouth daily as needed for erectile dysfunction.     tamsulosin (FLOMAX) 0.4 MG CAPS capsule TAKE 1 CAPSULE BY MOUTH EVERY DAY 90 capsule 3   thiamine (VITAMIN B1) 100 MG tablet Take 1 tablet (100 mg total) by mouth once a week.     triamcinolone cream (KENALOG) 0.1 % Apply topically as needed. 30 g 0   Vitamin D, Ergocalciferol, (DRISDOL) 1.25 MG (50000 UNIT) CAPS  capsule Take 50,000 Units by mouth once a week.     Albuterol-Budesonide (AIRSUPRA) 90-80 MCG/ACT AERO Inhale 2 puffs into the lungs every 6 (six) hours as needed (wheezing, cough). 10.7 g 6   Albuterol-Budesonide (AIRSUPRA) 90-80 MCG/ACT AERO Inhale 2 puffs into the lungs every 4 (four) hours as needed. 5.9 g 0   terbinafine (LAMISIL) 250 MG tablet Take 1 tablet (250 mg total) by mouth daily. 90 tablet 0   No facility-administered medications prior to visit.     Per HPI unless specifically indicated in ROS section below Review of Systems  Objective:  BP 132/70   Pulse 80   Temp 97.6 F (36.4 C) (Oral)   Ht 5\' 10"  (1.778 m)   Wt 235 lb 4 oz (106.7 kg)   SpO2 96%   BMI 33.75 kg/m   Wt Readings from Last 3 Encounters:  12/25/22 235 lb 4 oz (106.7 kg)  12/09/22 237 lb 6.4 oz (107.7 kg)  11/19/22 230 lb (104.3 kg)      Physical Exam Vitals and nursing note reviewed.  Constitutional:      Appearance: Normal appearance. He is not ill-appearing.  HENT:     Mouth/Throat:     Mouth: Mucous membranes are moist.     Pharynx: Oropharynx is clear. No oropharyngeal exudate or posterior oropharyngeal erythema.  Eyes:     Extraocular Movements: Extraocular movements intact.     Conjunctiva/sclera: Conjunctivae normal.     Pupils: Pupils are equal, round, and reactive to light.  Cardiovascular:     Rate and Rhythm: Normal rate and regular rhythm.     Pulses: Normal pulses.     Heart sounds: Normal heart sounds. No murmur heard. Pulmonary:     Effort: Pulmonary effort is normal. No respiratory distress.     Breath sounds: Normal breath sounds. No wheezing, rhonchi or rales.  Musculoskeletal:     Right lower leg: No edema.     Left lower leg: No edema.     Comments: See HPI for foot exam if done  Skin:    General: Skin is warm and dry.     Findings: No rash.  Neurological:     Mental Status: He is alert.  Psychiatric:        Mood and Affect: Mood normal.        Behavior:  Behavior normal.       Results for orders placed or performed in visit on 12/25/22  POCT glycosylated hemoglobin (Hb A1C)   Collection Time: 12/25/22  3:11 PM  Result Value Ref Range   Hemoglobin A1C 6.0 (A) 4.0 - 5.6 %   HbA1c POC (<> result, manual entry)     HbA1c, POC (prediabetic range)     HbA1c, POC (controlled diabetic range)     Lab Results  Component Value Date   PSA1 1.6 11/19/2022   PSA1 1.4 10/31/2020   PSA1 1.4 10/27/2019   PSA 1.60 06/18/2022   PSA 1.34 06/06/2021   PSA 1.04 04/01/2016   Assessment & Plan:   Problem List Items Addressed This Visit     Type 2 diabetes mellitus with other specified complication (HCC) - Primary   Chronic, stable period on metformin 500mg  daily. Continue this.  Foot exam today.  Encouraged he call and schedule diabetic eye exam as due.  Suggested trial OTC ALA 600mg  daily for presumed diabetic neuropathy.  Pt agrees to referral for diabetes education classes at [facility].       Relevant Orders   POCT glycosylated hemoglobin (Hb A1C) (Completed)   Ambulatory referral to diabetic education   Memory deficit   Followed by neurology - on namenda 10mg  BID, also started on ritalin.       Peripheral neuropathy   Suggested trial OTC ALA 600mg  daily for presumed diabetic neuropathy.  NCS 01/2022 showed mod sensory and motor neuropathy with mixed axonal and demyelinating features.  Gabapentin ineffective. Continues pregabalin.  Has been seeing [facility] neurologist Dr Raylene Miyamoto.  Pending [facility] neurology appt for 2nd opinion.       Low serum vitamin B12   Latest levels normal off replacement       ADHD   Diagnosed 2024 by neurology [place] in [facility] now on Ritalin         No orders of the defined types were placed in this encounter.   Orders Placed This Encounter  Procedures   Ambulatory referral to diabetic education    Referral Priority:   Routine    Referral Type:   Consultation    Referral Reason:    Specialty Services Required    Number of Visits Requested:   1   POCT glycosylated hemoglobin (Hb A1C)    Patient Instructions  Call to schedule diabetic eye exam as you're due.  We will refer you to diabetes education classes (nutritionist, diabetes nurse).  Try alpha lipoic acid 600mg  daily over the counter for diabetic neuropathy.  Good to see you today Return in 6 months for physical   Follow up plan: Return in about 6 months (around 06/25/2023) for medicare wellness visit.  Eustaquio Boyden, MD

## 2022-12-25 NOTE — Telephone Encounter (Signed)
Left message on vm, per dpr, notifying pt referral was placed and he can call [facility] at [phone] to schedule an appt.

## 2022-12-25 NOTE — Telephone Encounter (Signed)
COLONOSCOPY WITH PROPOFOL 12/18/2017 - TAx2, HP, diverticulosis, rpt 5 yrs [name], [name], MD)   Referral placed back to [facility].  He may call [facility] at [phone] to schedule an appointment.

## 2022-12-25 NOTE — Addendum Note (Signed)
Addended by: Eustaquio Boyden on: 12/25/2022 04:52 PM   Modules accepted: Orders

## 2022-12-25 NOTE — Patient Instructions (Addendum)
Call to schedule diabetic eye exam as you're due.  We will refer you to diabetes education classes (nutritionist, diabetes nurse).  Try alpha lipoic acid 600mg  daily over the counter for diabetic neuropathy.  Good to see you today Return in 6 months for physical

## 2022-12-25 NOTE — Assessment & Plan Note (Addendum)
Chronic, stable period on metformin 500mg  daily. Continue this.  Foot exam today.  Encouraged he call and schedule diabetic eye exam as due.  Suggested trial OTC ALA 600mg  daily for presumed diabetic neuropathy.  Pt agrees to referral for diabetes education classes at [facility].

## 2022-12-28 DIAGNOSIS — F909 Attention-deficit hyperactivity disorder, unspecified type: Secondary | ICD-10-CM | POA: Insufficient documentation

## 2022-12-28 NOTE — Assessment & Plan Note (Signed)
Latest levels normal off replacement

## 2022-12-28 NOTE — Assessment & Plan Note (Signed)
Diagnosed 2024 by neurology [place] in [facility] now on Ritalin

## 2022-12-28 NOTE — Assessment & Plan Note (Signed)
Followed by neurology - on namenda 10mg  BID, also started on ritalin.

## 2022-12-28 NOTE — Assessment & Plan Note (Signed)
Suggested trial OTC ALA 600mg  daily for presumed diabetic neuropathy.  NCS 01/2022 showed mod sensory and motor neuropathy with mixed axonal and demyelinating features.  Gabapentin ineffective. Continues pregabalin.  Has been seeing [facility] neurologist Dr Raylene Miyamoto.  Pending [facility] neurology appt for 2nd opinion.

## 2023-01-06 ENCOUNTER — Telehealth: Payer: Self-pay

## 2023-01-06 NOTE — Telephone Encounter (Signed)
Patient is calling us to schedule his recall colonoscopy.

## 2023-01-10 ENCOUNTER — Other Ambulatory Visit: Payer: Self-pay

## 2023-01-10 ENCOUNTER — Telehealth: Payer: Self-pay

## 2023-01-10 DIAGNOSIS — Z860100 Personal history of colon polyps, unspecified: Secondary | ICD-10-CM

## 2023-01-10 MED ORDER — NA SULFATE-K SULFATE-MG SULF 17.5-3.13-1.6 GM/177ML PO SOLN: 1.0000 | Freq: Once | ORAL | 0 refills | Status: AC

## 2023-01-10 NOTE — Telephone Encounter (Signed)
Gastroenterology Pre-Procedure Review  Request Date: 02/13/23 Requesting Physician: Dr. [name]  PATIENT REVIEW QUESTIONS: The patient responded to the following health history questions as indicated:    1. Are you having any GI issues? no 2. Do you have a personal history of Polyps? yes (Last colonoscopy performed by Dr. [name] 12/18/2017 recommended repeat in 5 years) 3. Do you have a family history of Colon Cancer or Polyps? no 4. Diabetes Mellitus? Yes noted and verbally informed pt to stop metformin (2) days prior to colonoscopy. 5. Joint replacements in the past 12 months?no 6. Major health problems in the past 3 months?no 7. Any artificial heart valves, MVP, or defibrillator?no 8. Cardiac issues? Yes carotid stenosis clearance sent to [facility] Pre[facility] team    MEDICATIONS & ALLERGIES:    Patient reports the following regarding taking any anticoagulation/antiplatelet therapy:   Plavix, Coumadin, Eliquis, Xarelto, Lovenox, Pradaxa, Brilinta, or Effient? no Aspirin? yes (81mg  daily)  Patient confirms/reports the following medications:  Current Outpatient Medications  Medication Sig Dispense Refill   albuterol (VENTOLIN HFA) 108 (90 Base) MCG/ACT inhaler TAKE 2 PUFFS BY MOUTH EVERY 6 HOURS AS NEEDED FOR WHEEZE OR SHORTNESS OF BREATH 18 each 1   allopurinol (ZYLOPRIM) 300 MG tablet Take 1 tablet (300 mg total) by mouth daily. 90 tablet 4   aspirin 81 MG chewable tablet Chew 1 tablet (81 mg total) by mouth daily.     atorvastatin (LIPITOR) 40 MG tablet TAKE 1 TABLET BY MOUTH EVERY DAY 90 tablet 0   ibuprofen (ADVIL,MOTRIN) 200 MG tablet Take 400 mg by mouth every 6 (six) hours as needed for moderate pain.     loratadine (CLARITIN) 10 MG tablet Take 10 mg by mouth daily.     memantine (NAMENDA) 10 MG tablet Take 1 tablet (10 mg total) by mouth 2 (two) times daily. For memory     metFORMIN (GLUCOPHAGE) 500 MG tablet Take 1 tablet (500 mg total) by mouth daily with breakfast. 90 tablet 4    pentoxifylline (TRENTAL) 400 MG CR tablet Take 400 mg by mouth 3 (three) times daily.     pregabalin (LYRICA) 100 MG capsule PLEASE SEE ATTACHED FOR DETAILED DIRECTIONS     tadalafil (CIALIS) 5 MG tablet Take 5 mg by mouth daily as needed for erectile dysfunction.     tamsulosin (FLOMAX) 0.4 MG CAPS capsule TAKE 1 CAPSULE BY MOUTH EVERY DAY 90 capsule 3   thiamine (VITAMIN B1) 100 MG tablet Take 1 tablet (100 mg total) by mouth once a week.     triamcinolone cream (KENALOG) 0.1 % Apply topically as needed. 30 g 0   Vitamin D, Ergocalciferol, (DRISDOL) 1.25 MG (50000 UNIT) CAPS capsule Take 50,000 Units by mouth once a week.     No current facility-administered medications for this visit.    Patient confirms/reports the following allergies:  Allergies  Allergen Reactions   Strattera [Atomoxetine Hcl] Other (See Comments)    Urinary retention    No orders of the defined types were placed in this encounter.   AUTHORIZATION INFORMATION Primary Insurance: 1D#: Group #:  Secondary Insurance: 1D#: Group #:  SCHEDULE INFORMATION: Date: 02/13/23 Time: Location: [facility]

## 2023-01-15 HISTORY — PX: COLONOSCOPY: SHX174

## 2023-01-17 ENCOUNTER — Telehealth: Payer: Self-pay

## 2023-01-17 NOTE — Telephone Encounter (Signed)
   Pre-operative Risk Assessment    Patient Name: Richard Case  DOB: 08-Feb-1955 MRN: [id number]   Date of last office visit: 10/08/22 with Bernardino Bring  Date of next office visit: None   Request for Surgical Clearance    Procedure:   Colonoscopy  Date of Surgery:  Clearance 02/13/23                                Surgeon:  Dr. Ruel Kung Surgeon's Group or Practice Name:  [facility]  Phone number:  [phone] [name]   Fax number:  [phone]   Type of Clearance Requested:   - Medical  - Pharmacy:  Hold Aspirin Not indicated    Type of Anesthesia:  General    Additional requests/questions:    [name]   01/17/2023, 4:28 PM

## 2023-01-20 ENCOUNTER — Telehealth: Payer: Self-pay

## 2023-01-20 NOTE — Telephone Encounter (Signed)
 Patient scheduled for tele visit on 01/30/23. Med rec and consent done

## 2023-01-20 NOTE — Telephone Encounter (Signed)
  Patient Consent for Virtual Visit         Richard Case has provided verbal consent on 01/20/2023 for a virtual visit (video or telephone).   CONSENT FOR VIRTUAL VISIT FOR:  Richard Case  By participating in this virtual visit I agree to the following:  I hereby voluntarily request, consent and authorize [facility] and its employed or contracted physicians, physician assistants, nurse practitioners or other licensed health care professionals (the Practitioner), to provide me with telemedicine health care services (the "Services) as deemed necessary by the treating Practitioner. I acknowledge and consent to receive the Services by the Practitioner via telemedicine. I understand that the telemedicine visit will involve communicating with the Practitioner through live audiovisual communication technology and the disclosure of certain medical information by electronic transmission. I acknowledge that I have been given the opportunity to request an in-person assessment or other available alternative prior to the telemedicine visit and am voluntarily participating in the telemedicine visit.  I understand that I have the right to withhold or withdraw my consent to the use of telemedicine in the course of my care at any time, without affecting my right to future care or treatment, and that the Practitioner or I may terminate the telemedicine visit at any time. I understand that I have the right to inspect all information obtained and/or recorded in the course of the telemedicine visit and may receive copies of available information for a reasonable fee.  I understand that some of the potential risks of receiving the Services via telemedicine include:  Delay or interruption in medical evaluation due to technological equipment failure or disruption; Information transmitted may not be sufficient (e.g. poor resolution of images) to allow for appropriate medical decision making by the Practitioner;  and/or  In rare instances, security protocols could fail, causing a breach of personal health information.  Furthermore, I acknowledge that it is my responsibility to provide information about my medical history, conditions and care that is complete and accurate to the best of my ability. I acknowledge that Practitioner's advice, recommendations, and/or decision may be based on factors not within their control, such as incomplete or inaccurate data provided by me or distortions of diagnostic images or specimens that may result from electronic transmissions. I understand that the practice of medicine is not an exact science and that Practitioner makes no warranties or guarantees regarding treatment outcomes. I acknowledge that a copy of this consent can be made available to me via my patient portal [facility] MyChart), or I can request a printed copy by calling the office of [facility].    I understand that my insurance will be billed for this visit.   I have read or had this consent read to me. I understand the contents of this consent, which adequately explains the benefits and risks of the Services being provided via telemedicine.  I have been provided ample opportunity to ask questions regarding this consent and the Services and have had my questions answered to my satisfaction. I give my informed consent for the services to be provided through the use of telemedicine in my medical care

## 2023-01-20 NOTE — Telephone Encounter (Signed)
   Name: Richard Case  DOB: 05-18-1955  MRN: [id number]  Primary Cardiologist: Deatrice Cage, MD   Preoperative team, please contact this patient and set up a phone call appointment for further preoperative risk assessment. Please obtain consent and complete medication review. Thank you for your help.  I confirm that guidance regarding antiplatelet and oral anticoagulation therapy has been completed and, if necessary, noted below.  Patient's aspirin is prescribed by PCP.  Recommendations for holding aspirin will need to come from prescribing provider.  I also confirmed the patient resides in the state of [place] . As per [facility] telemedicine laws, the patient must reside in the state in which the provider is licensed.   Josefa CHRISTELLA Beauvais, NP 01/20/2023, 9:05 AM [facility]

## 2023-01-30 ENCOUNTER — Ambulatory Visit: Payer: Medicare Other | Attending: Internal Medicine | Admitting: Nurse Practitioner

## 2023-01-30 ENCOUNTER — Other Ambulatory Visit: Payer: Self-pay

## 2023-01-30 ENCOUNTER — Emergency Department: Payer: Medicare Other

## 2023-01-30 ENCOUNTER — Emergency Department
Admission: EM | Admit: 2023-01-30 | Discharge: 2023-01-30 | Disposition: A | Discharge status: Home / Self Care | Payer: Medicare Other | Attending: Emergency Medicine | Admitting: Emergency Medicine

## 2023-01-30 DIAGNOSIS — W01198A Fall on same level from slipping, tripping and stumbling with subsequent striking against other object, initial encounter: Secondary | ICD-10-CM | POA: Diagnosis not present

## 2023-01-30 DIAGNOSIS — Z85828 Personal history of other malignant neoplasm of skin: Secondary | ICD-10-CM | POA: Diagnosis not present

## 2023-01-30 DIAGNOSIS — S0990XA Unspecified injury of head, initial encounter: Secondary | ICD-10-CM | POA: Diagnosis present

## 2023-01-30 DIAGNOSIS — S0003XA Contusion of scalp, initial encounter: Secondary | ICD-10-CM | POA: Diagnosis not present

## 2023-01-30 DIAGNOSIS — Y92194 Driveway of other specified residential institution as the place of occurrence of the external cause: Secondary | ICD-10-CM | POA: Diagnosis not present

## 2023-01-30 DIAGNOSIS — Z01810 Encounter for preprocedural cardiovascular examination: Secondary | ICD-10-CM

## 2023-01-30 MED ORDER — LIDOCAINE 5 % EX PTCH
1.0000 | MEDICATED_PATCH | CUTANEOUS | Status: DC
  Administered 2023-01-30: 1 via TRANSDERMAL
  Filled 2023-01-30: qty 1

## 2023-01-30 NOTE — ED Provider Triage Note (Signed)
Emergency Medicine Provider Triage Evaluation Note  Saketh Ferrand , a 68 y.o. male  was evaluated in triage.  Pt complains of head injury, fell and hit head few days ago, has wound that he is concerned about, no neck pain or other ortho injury.  Review of Systems  Positive:  Negative:   Physical Exam  There were no vitals taken for this visit. Gen:   Awake, no distress   Resp:  Normal effort  MSK:   Moves extremities without difficulty  Other:    Medical Decision Making  Medically screening exam initiated at 8:01 AM.  Appropriate orders placed.  Naheem Tenerelli was informed that the remainder of the evaluation will be completed by another provider, this initial triage assessment does not replace that evaluation, and the importance of remaining in the ED until their evaluation is complete.     Faythe Ghee, PA-C 01/30/23 [phone]

## 2023-01-30 NOTE — Progress Notes (Signed)
Virtual Visit via Telephone Note   Because of Richard Case co-morbid illnesses, he is at least at moderate risk for complications without adequate follow up.  This format is felt to be most appropriate for this patient at this time.  The patient did not have access to video technology/had technical difficulties with video requiring transitioning to audio format only (telephone).  All issues noted in this document were discussed and addressed.  No physical exam could be performed with this format.  Please refer to the patient's chart for his consent to telehealth for [facility].  Evaluation Performed:  Preoperative cardiovascular risk assessment _____________   Date:  01/30/2023   Patient ID:  Richard Case, DOB October 25, 1955, MRN [id number] Patient Location:  Home Provider location:   Office  Primary Care Provider:  Eustaquio Boyden, MD Primary Cardiologist:  Richard Bears, MD  Chief Complaint / Patient Profile   68 y.o. 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 Wyline Case of [facility] and presents today for telephonic preoperative cardiovascular risk assessment.  History of Present Illness    Richard Case is Case 68 y.o. male who presents via audio/video conferencing for Case telehealth visit today.  Pt was last seen in cardiology clinic on 10/08/2022 by Richard Listen, PA.  At that time Richard Case was doing well.  The patient is now pending procedure as outlined above. Since his last visit, he has done well from Case cardiac standpoint.  He had Case mechanical fall last week after slipping on ice.  He hit his head.  CT of the head was negative for any acute process.  He denies chest pain, palpitations, dyspnea, pnd, orthopnea, n, v, dizziness, syncope, edema, weight  gain, or early satiety. All other systems reviewed and are otherwise negative except as noted above.   Past Medical History    Past Medical History:  Diagnosis Date   ADD (attention deficit disorder) 2011   no records of workup; strattera caused urinary retention, not interested in habit forming medication   Atrial ectopy 2011   improved with CPAP, documented by holter   Benign colon polyp ?2014   hyperplastic   BPH (benign prostatic hypertrophy)    per prior PCP records   Diabetes mellitus without complication (HCC)    Elevated blood pressure (not hypertension)    Erectile dysfunction    GERD (gastroesophageal reflux disease)    Gout    Heart murmur longstanding   History of kidney stones 2008, 2017   History of pneumonia 2009   HLD (hyperlipidemia)    Kidney stones    HX   Male erectile dysfunction    Obesity, Class I, BMI 30-34.9    OSA (obstructive sleep apnea) 2010   OSA with stabilization at CPAP 7cm   Peyronie disease    per prior pcp records   Polycythemia    ?OSA related   Prediabetes    Rosacea    per prior pcp records   Seasonal allergic rhinitis    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   Past Surgical History:  Procedure Laterality Date   BASAL CELL CARCINOMA EXCISION Right    right ear, 08/2022   CARDIOVASCULAR STRESS TEST  07/2017   low risk stress test, hypertensive response (End)  COLONOSCOPY  2009   rec rpt 5 yrs per prior PCP records but no actual report   COLONOSCOPY  03/2012   mild diverticulosis, rpt 5 yrs (Richard Case in [place])   COLONOSCOPY WITH PROPOFOL N/Case 12/18/2017   TAx2, HP, diverticulosis, rpt 5 yrs Richard Case, Richard Salina, MD)   EXTRACORPOREAL SHOCK WAVE LITHOTRIPSY Right 04/06/2015   Procedure: EXTRACORPOREAL SHOCK WAVE LITHOTRIPSY (ESWL);  Surgeon: Richard Laser, MD;  Location: [facility] ORS;  Service: Urology;  Laterality: Right;   head surgery     01/2021, squamos cell carcinoma   MOHS SURGERY  2015   SCC of nose    TONSILLECTOMY      Allergies  Allergies  Allergen Reactions   Strattera [Atomoxetine Hcl] Other (See Comments)    Urinary retention    Home Medications    Prior to Admission medications   Medication Sig Start Date End Date Taking? Authorizing Provider  albuterol (VENTOLIN HFA) 108 (90 Base) MCG/ACT inhaler TAKE 2 PUFFS BY MOUTH EVERY 6 HOURS AS NEEDED FOR WHEEZE OR SHORTNESS OF BREATH 10/18/21   Richard Boyden, MD  allopurinol (ZYLOPRIM) 300 MG tablet Take 1 tablet (300 mg total) by mouth daily. 06/27/22   Richard Boyden, MD  aspirin 81 MG chewable tablet Chew 1 tablet (81 mg total) by mouth daily. 07/23/17   Richard Boyden, MD  atorvastatin (LIPITOR) 40 MG tablet TAKE 1 TABLET BY MOUTH EVERY DAY 09/24/22   Richard Boyden, MD  ibuprofen (ADVIL,MOTRIN) 200 MG tablet Take 400 mg by mouth every 6 (six) hours as needed for moderate pain.    [provider]  loratadine (CLARITIN) 10 MG tablet Take 10 mg by mouth daily.    [provider]  memantine (NAMENDA) 10 MG tablet Take 1 tablet (10 mg total) by mouth 2 (two) times daily. For memory 02/19/22   Richard Boyden, MD  metFORMIN (GLUCOPHAGE) 500 MG tablet Take 1 tablet (500 mg total) by mouth daily with breakfast. 06/27/22   Richard Boyden, MD  pentoxifylline (TRENTAL) 400 MG CR tablet Take 400 mg by mouth 3 (three) times daily. 01/31/22   [provider]  pregabalin (LYRICA) 100 MG capsule PLEASE SEE ATTACHED FOR DETAILED DIRECTIONS 05/03/22   [provider]  tadalafil (CIALIS) 5 MG tablet Take 5 mg by mouth daily as needed for erectile dysfunction.    [provider]  tamsulosin (FLOMAX) 0.4 MG CAPS capsule TAKE 1 CAPSULE BY MOUTH EVERY DAY 12/04/22   McGowan, Carollee Herter A, PA-C  thiamine (VITAMIN B1) 100 MG tablet Take 1 tablet (100 mg total) by mouth once Case week. 06/27/22   Richard Boyden, MD  triamcinolone cream (KENALOG) 0.1 % Apply topically as needed. 11/24/19   Richard Boyden,  MD  Vitamin D, Ergocalciferol, (DRISDOL) 1.25 MG (50000 UNIT) CAPS capsule Take 50,000 Units by mouth once Case week. 10/26/21   [provider]    Physical Exam    Vital Signs:  Bishop Hasbrook does not have vital signs available for review today.  Given telephonic nature of communication, physical exam is limited. AAOx3. NAD. Normal affect.  Speech and respirations are unlabored.  Accessory Clinical Findings    None  Assessment & Plan    1.  Preoperative Cardiovascular Risk Assessment:  According to the Revised Cardiac Risk Index (RCRI), his Perioperative Risk of Major Cardiac Event is (%): 0.4. His Functional Capacity in METs is: 7.25 according to the Duke Activity Status Index (DASI). Therefore, based on ACC/AHA guidelines, patient would be at acceptable risk  for the planned procedure without further cardiovascular testing.   The patient was advised that if he develops new symptoms prior to surgery to contact our office to arrange for Case follow-up visit, and he verbalized understanding.  Per office protocol, from Case cardiology perspective, he may hold Aspirin for 5-7 days prior to procedure. Please resume Aspirin as soon as possible postprocedure, at the discretion of the surgeon.    Case copy of this note will be routed to requesting surgeon.  Time:   Today, I have spent 6 minutes with the patient with telehealth technology discussing medical history, symptoms, and management plan.     Joylene Grapes, NP  01/30/2023, 10:35 AM
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
## 2023-01-30 NOTE — Discharge Instructions (Signed)
Your CT scan of the head was okay today. Your scalp wound does not require stitches. Continue to follow up with your doctor.
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
## 2023-01-30 NOTE — Telephone Encounter (Signed)
Preop Clearance received 01/30/23 from Bernadene Person, NP Cardiology.  Assessment & Plan    1.  Preoperative Cardiovascular Risk Assessment:   According to the Revised Cardiac Risk Index (RCRI), his Perioperative Risk of Major Cardiac Event is (%): 0.4. His Functional Capacity in METs is: 7.25 according to the Duke Activity Status Index (DASI). Therefore, based on ACC/AHA guidelines, patient would be at acceptable risk for the planned procedure without further cardiovascular testing.    The patient was advised that if he develops new symptoms prior to surgery to contact our office to arrange for a follow-up visit, and he verbalized understanding.   Per office protocol, from a cardiology perspective, he may hold Aspirin for 5-7 days prior to procedure. Please resume Aspirin as soon as possible postprocedure, at the discretion of the surgeon.      A copy of this note will be routed to requesting surgeon.   Time:   Today, I have spent 6 minutes with the patient with telehealth technology discussing medical history, symptoms, and management plan.

## 2023-02-04 ENCOUNTER — Telehealth: Payer: Self-pay

## 2023-02-04 NOTE — Telephone Encounter (Signed)
The patient call left a voicemail requesting for [place] to give him a call back. He want to check to make sure his prep was sent to the right pharmacy. [facility] on [address] [place], [place], [place] [address]. I called the patient back to let know that we received her message, no answer I left letting him know the message was sent to the scheduler.

## 2023-02-13 ENCOUNTER — Ambulatory Visit: Payer: Medicare Other | Admitting: General Practice

## 2023-02-13 ENCOUNTER — Encounter
Admission: RE | Disposition: A | Discharge status: Home / Self Care | Payer: Self-pay | Source: Home / Self Care | Attending: Gastroenterology

## 2023-02-13 ENCOUNTER — Ambulatory Visit
Admission: RE | Admit: 2023-02-13 | Discharge: 2023-02-13 | Disposition: A | Discharge status: Home / Self Care | Payer: Medicare Other | Attending: Gastroenterology | Admitting: Gastroenterology

## 2023-02-13 DIAGNOSIS — K5730 Diverticulosis of large intestine without perforation or abscess without bleeding: Secondary | ICD-10-CM | POA: Insufficient documentation

## 2023-02-13 DIAGNOSIS — Z1211 Encounter for screening for malignant neoplasm of colon: Secondary | ICD-10-CM | POA: Diagnosis present

## 2023-02-13 DIAGNOSIS — D120 Benign neoplasm of cecum: Secondary | ICD-10-CM | POA: Insufficient documentation

## 2023-02-13 DIAGNOSIS — D122 Benign neoplasm of ascending colon: Secondary | ICD-10-CM | POA: Insufficient documentation

## 2023-02-13 DIAGNOSIS — D123 Benign neoplasm of transverse colon: Secondary | ICD-10-CM | POA: Diagnosis not present

## 2023-02-13 DIAGNOSIS — Z7984 Long term (current) use of oral hypoglycemic drugs: Secondary | ICD-10-CM | POA: Diagnosis not present

## 2023-02-13 DIAGNOSIS — E119 Type 2 diabetes mellitus without complications: Secondary | ICD-10-CM | POA: Insufficient documentation

## 2023-02-13 DIAGNOSIS — D126 Benign neoplasm of colon, unspecified: Secondary | ICD-10-CM

## 2023-02-13 DIAGNOSIS — Z860100 Personal history of colon polyps, unspecified: Secondary | ICD-10-CM

## 2023-02-13 HISTORY — PX: POLYPECTOMY: SHX5525

## 2023-02-13 HISTORY — PX: COLONOSCOPY WITH PROPOFOL: SHX5780

## 2023-02-13 LAB — GLUCOSE, CAPILLARY: Glucose-Capillary: 135 mg/dL — ABNORMAL HIGH (ref 70–99)

## 2023-02-13 SURGERY — COLONOSCOPY WITH PROPOFOL
Anesthesia: General

## 2023-02-13 MED ORDER — SODIUM CHLORIDE 0.9 % IV SOLN: INTRAVENOUS | Status: DC

## 2023-02-13 MED ORDER — PROPOFOL 10 MG/ML IV BOLUS
INTRAVENOUS | Status: DC | PRN
  Administered 2023-02-13: 50 mg via INTRAVENOUS
  Administered 2023-02-13 (×6): 20 mg via INTRAVENOUS
  Administered 2023-02-13: 30 mg via INTRAVENOUS

## 2023-02-13 MED ORDER — LIDOCAINE HCL (PF) 1 % IJ SOLN
INTRAMUSCULAR | Status: DC | PRN
  Administered 2023-02-13: 60 mg

## 2023-02-13 NOTE — Anesthesia Postprocedure Evaluation (Signed)
Anesthesia Post Note  Patient: Richard Case  Procedure(s) Performed: COLONOSCOPY WITH PROPOFOL POLYPECTOMY  Patient location during evaluation: PACU Anesthesia Type: General Level of consciousness: awake and alert Pain management: pain level controlled Vital Signs Assessment: post-procedure vital signs reviewed and stable Respiratory status: spontaneous breathing, nonlabored ventilation, respiratory function stable and patient connected to nasal cannula oxygen Cardiovascular status: blood pressure returned to baseline and stable Postop Assessment: no apparent nausea or vomiting Anesthetic complications: no  No notable events documented.   Last Vitals:  Vitals:   02/13/23 0910 02/13/23 0920  BP: 97/65 122/79  Pulse: 82 81  Resp: 20 (!) 25  Temp: (!) 36.1 C   SpO2: 96% 98%    Last Pain:  Vitals:   02/13/23 0920  TempSrc:   PainSc: 0-No pain                 Stephanie Coup

## 2023-02-13 NOTE — Transfer of Care (Signed)
Immediate Anesthesia Transfer of Care Note  Patient: Devontaye Ground  Procedure(s) Performed: COLONOSCOPY WITH PROPOFOL POLYPECTOMY  Patient Location: PACU and Endoscopy Unit  Anesthesia Type:MAC  Level of Consciousness: drowsy  Airway & Oxygen Therapy: Patient Spontanous Breathing and Patient connected to nasal cannula oxygen  Post-op Assessment: Report given to RN and Post -op Vital signs reviewed and stable  Post vital signs: Reviewed and stable  Last Vitals:  Vitals Value Taken Time  BP 97/65 02/13/23 0910  Temp 36.1 C 02/13/23 0910  Pulse 84 02/13/23 0912  Resp 24 02/13/23 0912  SpO2 97 % 02/13/23 0912  Vitals shown include unfiled device data.  Last Pain:  Vitals:   02/13/23 0910  TempSrc: Temporal  PainSc: 0-No pain         Complications: No notable events documented.

## 2023-02-13 NOTE — Op Note (Signed)
[facility] Gastroenterology Patient Name: Richard Case Procedure Date: 02/13/2023 8:48 AM MRN: [id number] Account #: [id number] Date of Birth: 06-27-1955 Admit Type: Outpatient Age: 68 Room: [facility] ENDO ROOM 2 Gender: Male Note Status: Finalized Instrument Name: Nelda Marseille [id number] Procedure:             Colonoscopy Indications:           Surveillance: Personal history of adenomatous polyps                         on last colonoscopy 3 years ago, Last colonoscopy:                         December 2019 Providers:             Wyline Mood MD, MD Referring MD:          No Local Md, MD (Referring MD) Medicines:             Monitored Anesthesia Care Complications:         No immediate complications. Procedure:             Pre-Anesthesia Assessment:                        - Prior to the procedure, a History and Physical was                         performed, and patient medications, allergies and                         sensitivities were reviewed. The patient's tolerance                         of previous anesthesia was reviewed.                        - The risks and benefits of the procedure and the                         sedation options and risks were discussed with the                         patient. All questions were answered and informed                         consent was obtained.                        - ASA Grade Assessment: II - A patient with mild                         systemic disease.                        After obtaining informed consent, the colonoscope was                         passed under direct vision. Throughout the procedure,                         the patient's blood  pressure, pulse, and oxygen                         saturations were monitored continuously. The                         Colonoscope was introduced through the anus and                         advanced to the the cecum, identified by the                         appendiceal  orifice. The colonoscopy was performed                         with ease. The patient tolerated the procedure well.                         The quality of the bowel preparation was excellent.                         The ileocecal valve, appendiceal orifice, and rectum                         were photographed. Findings:      The perianal and digital rectal examinations were normal.      Two sessile polyps were found in the ascending colon. The polyps were 4       to 5 mm in size. These polyps were removed with a cold snare. Resection       and retrieval were complete.      A 5 mm polyp was found in the transverse colon. The polyp was sessile.       The polyp was removed with a cold snare. Resection and retrieval were       complete.      The exam was otherwise without abnormality on direct and retroflexion       views.      Multiple medium-mouthed diverticula were found in the entire colon.      The exam was otherwise without abnormality on direct and retroflexion       views. Impression:            - Two 4 to 5 mm polyps in the ascending colon, removed                         with a cold snare. Resected and retrieved.                        - One 5 mm polyp in the transverse colon, removed with                         a cold snare. Resected and retrieved.                        - The examination was otherwise normal on direct and                         retroflexion views.                        -  Diverticulosis in the entire examined colon.                        - The examination was otherwise normal on direct and                         retroflexion views. Recommendation:        - Discharge patient to home.                        - Resume previous diet.                        - Continue present medications.                        - Await pathology results.                        - Repeat colonoscopy in 3 - 5 years for surveillance. Procedure Code(s):     --- Professional ---                         [phone], Colonoscopy, flexible; with removal of                         tumor(s), polyp(s), or other lesion(s) by snare                         technique Diagnosis Code(s):     --- Professional ---                        Z86.010, Personal history of colonic polyps                        D12.2, Benign neoplasm of ascending colon                        D12.3, Benign neoplasm of transverse colon (hepatic                         flexure or splenic flexure)                        K57.30, Diverticulosis of large intestine without                         perforation or abscess without bleeding CPT copyright 2022 American Medical Association. All rights reserved. The codes documented in this report are preliminary and upon coder review may  be revised to meet current compliance requirements. Wyline Mood, MD Wyline Mood MD, MD 02/13/2023 9:09:24 AM This report has been signed electronically. Number of Addenda: 0 Note Initiated On: 02/13/2023 8:48 AM Scope Withdrawal Time: 0 hours 12 minutes 46 seconds  Total Procedure Duration: 0 hours 14 minutes 10 seconds  Estimated Blood Loss:  Estimated blood loss: none.      [facility]

## 2023-02-13 NOTE — H&P (Signed)
Richard Mood, MD [address], [address], [place], [place], [address] [address], [address], [place], [place], [address] Phone: [phone]  Fax: [phone]  Primary Care Physician:  Richard Boyden, MD   Pre-Procedure History & Physical: HPI:  Richard Case is a 67 y.o. male is here for an colonoscopy.   Past Medical History:  Diagnosis Date   ADD (attention deficit disorder) 2011   no records of workup; strattera caused urinary retention, not interested in habit forming medication   Atrial ectopy 2011   improved with CPAP, documented by holter   Benign colon polyp ?2014   hyperplastic   BPH (benign prostatic hypertrophy)    per prior PCP records   Diabetes mellitus without complication (HCC)    Elevated blood pressure (not hypertension)    Erectile dysfunction    GERD (gastroesophageal reflux disease)    Gout    Heart murmur longstanding   History of kidney stones 2008, 2017   History of pneumonia 2009   HLD (hyperlipidemia)    Kidney stones    HX   Male erectile dysfunction    Obesity, Class I, BMI 30-34.9    OSA (obstructive sleep apnea) 2010   OSA with stabilization at CPAP 7cm   Peyronie disease    per prior pcp records   Polycythemia    ?OSA related   Prediabetes    Rosacea    per prior pcp records   Seasonal allergic rhinitis    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    Past Surgical History:  Procedure Laterality Date   BASAL CELL CARCINOMA EXCISION Right    right ear, 08/2022   CARDIOVASCULAR STRESS TEST  07/2017   low risk stress test, hypertensive response (End)   COLONOSCOPY  2009   rec rpt 5 yrs per prior PCP records but no actual report   COLONOSCOPY  03/2012   mild diverticulosis, rpt 5 yrs (Dr Richard Case in [place])   COLONOSCOPY WITH PROPOFOL N/A 12/18/2017   TAx2, HP, diverticulosis, rpt 5 yrs Richard Case, Richard Salina, MD)   EXTRACORPOREAL SHOCK WAVE LITHOTRIPSY Right 04/06/2015   Procedure: EXTRACORPOREAL SHOCK WAVE  LITHOTRIPSY (ESWL);  Surgeon: Richard Laser, MD;  Location: [facility] ORS;  Service: Urology;  Laterality: Right;   head surgery     01/2021, squamos cell carcinoma   MOHS SURGERY  2015   SCC of nose   TONSILLECTOMY      Prior to Admission medications   Medication Sig Start Date End Date Taking? Authorizing Provider  albuterol (VENTOLIN HFA) 108 (90 Base) MCG/ACT inhaler TAKE 2 PUFFS BY MOUTH EVERY 6 HOURS AS NEEDED FOR WHEEZE OR SHORTNESS OF BREATH 10/18/21   Richard Boyden, MD  allopurinol (ZYLOPRIM) 300 MG tablet Take 1 tablet (300 mg total) by mouth daily. 06/27/22  Yes Richard Boyden, MD  aspirin 81 MG chewable tablet Chew 1 tablet (81 mg total) by mouth daily. 07/23/17   Richard Boyden, MD  atorvastatin (LIPITOR) 40 MG tablet TAKE 1 TABLET BY MOUTH EVERY DAY 09/24/22  Yes Richard Boyden, MD  ibuprofen (ADVIL,MOTRIN) 200 MG tablet Take 400 mg by mouth every 6 (six) hours as needed for moderate pain.    [provider]  loratadine (CLARITIN) 10 MG tablet Take 10 mg by mouth daily.    [provider]  memantine (NAMENDA) 10 MG tablet Take 1 tablet (10 mg total) by mouth 2 (two) times daily. For memory 02/19/22  Yes Richard Boyden, MD  metFORMIN (GLUCOPHAGE)  500 MG tablet Take 1 tablet (500 mg total) by mouth daily with breakfast. 06/27/22   Richard Boyden, MD  pentoxifylline (TRENTAL) 400 MG CR tablet Take 400 mg by mouth 3 (three) times daily. 01/31/22  Yes [provider]  pregabalin (LYRICA) 100 MG capsule PLEASE SEE ATTACHED FOR DETAILED DIRECTIONS 05/03/22   [provider]  tadalafil (CIALIS) 5 MG tablet Take 5 mg by mouth daily as needed for erectile dysfunction.    [provider]  tamsulosin (FLOMAX) 0.4 MG CAPS capsule TAKE 1 CAPSULE BY MOUTH EVERY DAY 12/04/22   Richard Case  thiamine (VITAMIN B1) 100 MG tablet Take 1 tablet (100 mg total) by mouth once a week. 06/27/22   Richard Boyden, MD  triamcinolone cream  (KENALOG) 0.1 % Apply topically as needed. 11/24/19   Richard Boyden, MD  Vitamin D, Ergocalciferol, (DRISDOL) 1.25 MG (50000 UNIT) CAPS capsule Take 50,000 Units by mouth once a week. 10/26/21   [provider]    Allergies as of 01/10/2023 - Review Complete 01/10/2023  Allergen Reaction Noted   Strattera [atomoxetine hcl] Other (See Comments) 02/04/2015    Family History  Problem Relation Age of Onset   Cancer Father 62       prostate   CAD Father        possibly?   Prostate cancer Father    Heart Problems Father    Cancer Mother        lung   Lung disease Mother    Stroke Neg Hx    Diabetes Neg Hx    Kidney disease Neg Hx    Kidney cancer Neg Hx    Bladder Cancer Neg Hx     Social History   Socioeconomic History   Marital status: Married    Spouse name: Not on file   Number of children: Not on file   Years of education: Not on file   Highest education level: Bachelor's degree (e.g., BA, AB, BS)  Occupational History   Not on file  Tobacco Use   Smoking status: Never   Smokeless tobacco: Never  Vaping Use   Vaping status: Never Used  Substance and Sexual Activity   Alcohol use: Yes    Comment: once a month   Drug use: No   Sexual activity: Not on file  Other Topics Concern   Not on file  Social History Narrative   Lives with wife Richard Case, 1 dog   Occupation: retired - Sport and exercise psychologist for [employer]   Edu: BS   Activity: active in yard   Diet: good water, fruits/vegetables daily   Social Drivers of Corporate investment banker Strain: Medium Risk (12/21/2022)   Overall Financial Resource Strain (CARDIA)    Difficulty of Paying Living Expenses: Somewhat hard  Food Insecurity: No Food Insecurity (12/21/2022)   Hunger Vital Sign    Worried About Running Out of Food in the Last Year: Never true    Ran Out of Food in the Last Year: Never true  Transportation Needs: No Transportation Needs (12/21/2022)   PRAPARE - Scientist, research (physical sciences) (Medical): No    Lack of Transportation (Non-Medical): No  Physical Activity: Unknown (12/21/2022)   Exercise Vital Sign    Days of Exercise per Week: 0 days    Minutes of Exercise per Session: Not on file  Stress: Stress Concern Present (12/21/2022)   [employer] of Occupational Health - Occupational Stress Questionnaire    Feeling  of Stress : To some extent  Social Connections: Moderately Isolated (12/21/2022)   Social Connection and Isolation Panel [NHANES]    Frequency of Communication with Friends and Family: Once a week    Frequency of Social Gatherings with Friends and Family: Once a week    Attends Religious Services: More than 4 times per year    Active Member of [employer] or Organizations: No    Attends Engineer, structural: Not on file    Marital Status: Married  Intimate Partner Violence: Unknown (07/11/2021)   Received from [employer], [facility]   HITS    Physically Hurt: Not on file    Insult or Talk Down To: Not on file    Threaten Physical Harm: Not on file    Scream or Curse: Not on file    Review of Systems: See HPI, otherwise negative ROS  Physical Exam: Ht 5\' 10"  (1.778 m)   Wt 105.2 kg   BMI 33.29 kg/m  General:   Alert,  pleasant and cooperative in NAD Head:  Normocephalic and atraumatic. Neck:  Supple; no masses or thyromegaly. Lungs:  Clear throughout to auscultation, normal respiratory effort.    Heart:  +S1, +S2, Regular rate and rhythm, No edema. Abdomen:  Soft, nontender and nondistended. Normal bowel sounds, without guarding, and without rebound.   Neurologic:  Alert and  oriented x4;  grossly normal neurologically.  Impression/Plan: Richard Case is here for an colonoscopy to be performed for surveillance due to prior history of colon polyps   Risks, benefits, limitations, and alternatives regarding  colonoscopy have been reviewed with the patient.  Questions have been answered.  All parties agreeable.   Richard Mood, MD  02/13/2023, 8:03 AM

## 2023-02-13 NOTE — Anesthesia Preprocedure Evaluation (Signed)
Anesthesia Evaluation  Patient identified by MRN, date of birth, ID band Patient awake    Reviewed: Allergy & Precautions, H&P , NPO status , Patient's Chart, lab work & pertinent test results, reviewed documented beta blocker date and time   History of Anesthesia Complications Negative for: history of anesthetic complications  Airway Mallampati: I  TM Distance: >3 FB Neck ROM: full    Dental  (+) Missing, Dental Advidsory Given, Poor Dentition, Caps   Pulmonary neg shortness of breath, sleep apnea , neg COPD, neg recent URI          Cardiovascular Exercise Tolerance: Good (-) hypertension(-) angina (-) CAD, (-) Past MI, (-) Cardiac Stents and (-) CABG (-) dysrhythmias + Valvular Problems/Murmurs      Neuro/Psych negative neurological ROS     GI/Hepatic Neg liver ROS,GERD  ,,  Endo/Other  negative endocrine ROSdiabetes, Oral Hypoglycemic Agents    Renal/GU      Musculoskeletal   Abdominal   Peds  Hematology negative hematology ROS (+)   Anesthesia Other Findings Past Medical History: 2011: ADD (attention deficit disorder)     Comment:  no records of workup; strattera caused urinary               retention, not interested in habit forming medication 2011: Atrial ectopy     Comment:  improved with CPAP, documented by holter ?2014: Benign colon polyp     Comment:  hyperplastic No date: BPH (benign prostatic hypertrophy)     Comment:  per prior PCP records No date: Diabetes mellitus without complication (HCC) No date: Elevated blood pressure (not hypertension) No date: Erectile dysfunction No date: GERD (gastroesophageal reflux disease) No date: Gout longstanding: Heart murmur 2008, 2017: History of kidney stones 2009: History of pneumonia No date: HLD (hyperlipidemia) No date: Kidney stones     Comment:  HX No date: Male erectile dysfunction No date: Obesity, Class I, BMI 30-34.9 2010: OSA (obstructive  sleep apnea)     Comment:  OSA with stabilization at CPAP 7cm No date: Peyronie disease     Comment:  per prior pcp records No date: Polycythemia     Comment:  ?OSA related No date: Prediabetes No date: Rosacea     Comment:  per prior pcp records No date: Seasonal allergic rhinitis 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   Reproductive/Obstetrics negative OB ROS                             Anesthesia Physical Anesthesia Plan  ASA: 2  Anesthesia Plan: General   Post-op Pain Management: Minimal or no pain anticipated   Induction: Intravenous  PONV Risk Score and Plan: 3 and Propofol infusion, TIVA and Ondansetron  Airway Management Planned: Nasal Cannula  Additional Equipment: None  Intra-op Plan:   Post-operative Plan:   Informed Consent: I have reviewed the patients History and Physical, chart, labs and discussed the procedure including the risks, benefits and alternatives for the proposed anesthesia with the patient or authorized representative who has indicated his/her understanding and acceptance.     Dental advisory given  Plan Discussed with: CRNA and Surgeon  Anesthesia Plan Comments: (Discussed risks of anesthesia with patient, including possibility of difficulty with spontaneous ventilation under anesthesia necessitating airway intervention, PONV, and rare risks such as cardiac or respiratory or neurological events, and allergic reactions. Discussed the role of CRNA in patient's perioperative care.  Patient understands.)       Anesthesia Quick Evaluation

## 2023-02-14 LAB — SURGICAL PATHOLOGY

## 2023-02-15 ENCOUNTER — Encounter: Payer: Self-pay | Admitting: Family Medicine

## 2023-02-17 ENCOUNTER — Encounter: Payer: Self-pay | Admitting: Gastroenterology

## 2023-02-26 ENCOUNTER — Encounter: Payer: Self-pay | Admitting: Dietician

## 2023-02-26 ENCOUNTER — Encounter: Payer: Medicare Other | Attending: Family Medicine | Admitting: Dietician

## 2023-02-26 DIAGNOSIS — Z713 Dietary counseling and surveillance: Secondary | ICD-10-CM | POA: Diagnosis not present

## 2023-02-26 DIAGNOSIS — E119 Type 2 diabetes mellitus without complications: Secondary | ICD-10-CM | POA: Diagnosis present

## 2023-02-26 DIAGNOSIS — E1169 Type 2 diabetes mellitus with other specified complication: Secondary | ICD-10-CM | POA: Insufficient documentation

## 2023-02-26 NOTE — Patient Instructions (Addendum)
Look into joining a pickleball league or practicing on the courts near your [place] in the [place].  Work towards a goal of of drinking 64 oz. of water each day. Consider getting a water bottle that you can keep with you throughout the day as a reminder to stay hydrated.  Consider switching to a decaffeinated coffee for your coffee drinks and use a sugar-free flavored creamer or coffee flavored Premier protein.  Look into Dove Dark Chocolate or Chocolate Mint bite sized.  Work towards eating three meals a day, about 5-6 hours apart!  Begin to recognize carbohydrates, proteins, and non-starchy vegetables in your food choices!  Begin to build your meals using the proportions of the Balanced Plate. First, select your carb choice(s) for the meal. Make this 25% of your meal. Next, select your source of protein to pair with your carb choice(s). Make this another 25% of your meal. Choose LEAN proteins (Low or fat free dairy) Finally, complete your meal with a variety of non-starchy vegetables. Make this the remaining 50% of your meal.

## 2023-02-26 NOTE — Progress Notes (Signed)
Diabetes Self-Management Education  Visit Type: First/Initial  Appt. Start Time: 1410 Appt. End Time: 1530  02/26/2023  [name], identified by name and date of birth, is a 68 y.o. male with a diagnosis of Diabetes: Type 2.   ASSESSMENT Pt reports taking metformin for their DM, no side effects reported, has been taking for a long time, gets B12 checked annually, most recent value of 843 (12/26/2022). Pt reports long history of maintaining A1c ~6.0%. Pt reports variance in FBG, can be as low as ~100 but as high as ~150, pt states their meal times are variable Pt reports a goal of establishing a sustainable routine to aid in weight loss of 30 lbs., states they feel this will allow them to be more active and improve quality of life. Pt reports difficulty maintaining a routine since retiring 8 years ago, states this makes meals, exercise, and SMBG difficult to be consistent with. Pt reports history of trying a weight loss regiment that involved shakes for breakfast and lunch, was able to lose 40 lbs and exercised more. Pt reports history of exercising by playing tennis, riding bicycle, and has tried pickleball more recently. Pt reports liking to do yardwork for activity in the spring and summer, walks dog briefly currently for activity. Pt reports not drinking enough fluids, especially water, drinks 3-5 coffees w/Stevia and flavored creamer throughout the day. Pt reports developing neuropathy over the last 1-2 years, states they are taking gabapentin for it and wearing diabetic socks, but has not seen much improvement, checking feet regularly.   Diabetes Self-Management Education - 02/26/23 1421       Visit Information   Visit Type First/Initial      Initial Visit   Diabetes Type Type 2    Date Diagnosed 10+ years ago    Are you currently following a meal plan? No    Are you taking your medications as prescribed? Yes      Health Coping   How would you rate your overall health? Fair       Psychosocial Assessment   What is the hardest part about your diabetes right now, causing you the most concern, or is the most worrisome to you about your diabetes?   Getting support / problem solving;Being active;Making healty food and beverage choices;Checking blood sugar    Self-care barriers None    Self-management support Doctor's office;Family    Other persons present Patient    Patient Concerns Weight Control;Glycemic Control    Special Needs None    Preferred Learning Style Other (comment)    Learning Readiness Ready    How often do you need to have someone help you when you read instructions, pamphlets, or other written materials from your doctor or pharmacy? 1 - Never    What is the last grade level you completed in school? [employer]      Pre-Education Assessment   Patient understands the diabetes disease and treatment process. Needs Instruction    Patient understands incorporating nutritional management into lifestyle. Needs Instruction    Patient undertands incorporating physical activity into lifestyle. Needs Instruction    Patient understands using medications safely. Needs Instruction    Patient understands monitoring blood glucose, interpreting and using results Needs Instruction    Patient understands prevention, detection, and treatment of acute complications. Needs Instruction    Patient understands prevention, detection, and treatment of chronic complications. Needs Instruction    Patient understands how to develop strategies to address psychosocial issues. Needs Instruction  Patient understands how to develop strategies to promote health/change behavior. Needs Instruction      Complications   Last HgB A1C per patient/outside source 6.2 %   12/26/2022   How often do you check your blood sugar? 1-2 times/day   Occasionally misses days   Fasting Blood glucose range (mg/dL) 21-308;657-846    Have you had a dilated eye exam in the past 12 months? No    Have you  had a dental exam in the past 12 months? Yes    Are you checking your feet? Yes    How many days per week are you checking your feet? 4      Dietary Intake   Breakfast 2 eggs on rye bread w/ tomato slice    Lunch Bologna and pimento cheese sandwich on whole wheat    Dinner Grilled pork, grean beans, broccoli&cauliflower, 1/2 piece of naan    Beverage(s) Coffee, water      Activity / Exercise   Activity / Exercise Type ADL's;Light (walking / raking leaves)    How many days per week do you exercise? 10    How many minutes per day do you exercise? 7    Total minutes per week of exercise 70      Patient Education   Previous Diabetes Education No    Healthy Eating Meal options for control of blood glucose level and chronic complications.    Being Active Role of exercise on diabetes management, blood pressure control and cardiac health.    Medications Reviewed patients medication for diabetes, action, purpose, timing of dose and side effects.    Monitoring Taught/evaluated SMBG meter.;Identified appropriate SMBG and/or A1C goals.    Chronic complications Relationship between chronic complications and blood glucose control;Assessed and discussed foot care and prevention of foot problems    Lifestyle and Health Coping Lifestyle issues that need to be addressed for better diabetes care   Structure!     Individualized Goals (developed by patient)   Nutrition Follow meal plan discussed    Medications take my medication as prescribed    Monitoring  Test my blood glucose as discussed    Problem Solving Eating Pattern    Reducing Risk examine blood glucose patterns    Health Coping Discuss barriers to diabetes care with support person/system (comment specifics as needed)      Post-Education Assessment   Patient understands the diabetes disease and treatment process. Needs Review    Patient understands incorporating nutritional management into lifestyle. Needs Review    Patient undertands  incorporating physical activity into lifestyle. Needs Review    Patient understands using medications safely. Demonstrates understanding / competency    Patient understands monitoring blood glucose, interpreting and using results Comprehends key points    Patient understands prevention, detection, and treatment of acute complications. N/A    Patient understands prevention, detection, and treatment of chronic complications. Comprehends key points    Patient understands how to develop strategies to address psychosocial issues. Comprehends key points    Patient understands how to develop strategies to promote health/change behavior. Comprehends key points      Outcomes   Expected Outcomes Demonstrated interest in learning but significant barriers to change    Future DMSE 3-4 months    Program Status Not Completed             Individualized Plan for Diabetes Self-Management Training:   Learning Objective:  Patient will have a greater understanding of diabetes self-management. Patient education plan is  to attend individual and/or group sessions per assessed needs and concerns.   Plan:   Patient Instructions  Look into joining a pickleball league or practicing on the courts [place] your [place].  Work towards a goal of of drinking 64 oz. of water each day. Consider getting a water bottle that you can keep with you throughout the day as a reminder to stay hydrated.  Consider switching to a decaffeinated coffee for your coffee drinks and use a sugar-free flavored creamer or coffee flavored Premier protein.  Look into Dove Dark Chocolate or Chocolate Mint bite sized.  Work towards eating three meals a day, about 5-6 hours apart!  Begin to recognize carbohydrates, proteins, and non-starchy vegetables in your food choices!  Begin to build your meals using the proportions of the Balanced Plate. First, select your carb choice(s) for the meal. Make this 25% of your meal. Next,  select your source of protein to pair with your carb choice(s). Make this another 25% of your meal. Choose LEAN proteins (Low or fat free dairy) Finally, complete your meal with a variety of non-starchy vegetables. Make this the remaining 50% of your meal.     Expected Outcomes:  Demonstrated interest in learning but significant barriers to change  Education material provided: My Plate  If problems or questions, patient to contact team via:  Phone and Email  Future DSME appointment: 3-4 months

## 2023-03-29 IMAGING — DX DG CHEST 2V
2 series · 2 of 2 positions shown · non-contrast
Comparison: April 04, 2016

CLINICAL DATA: Chronic cough

EXAM:
CHEST - 2 VIEW

[chest pa]
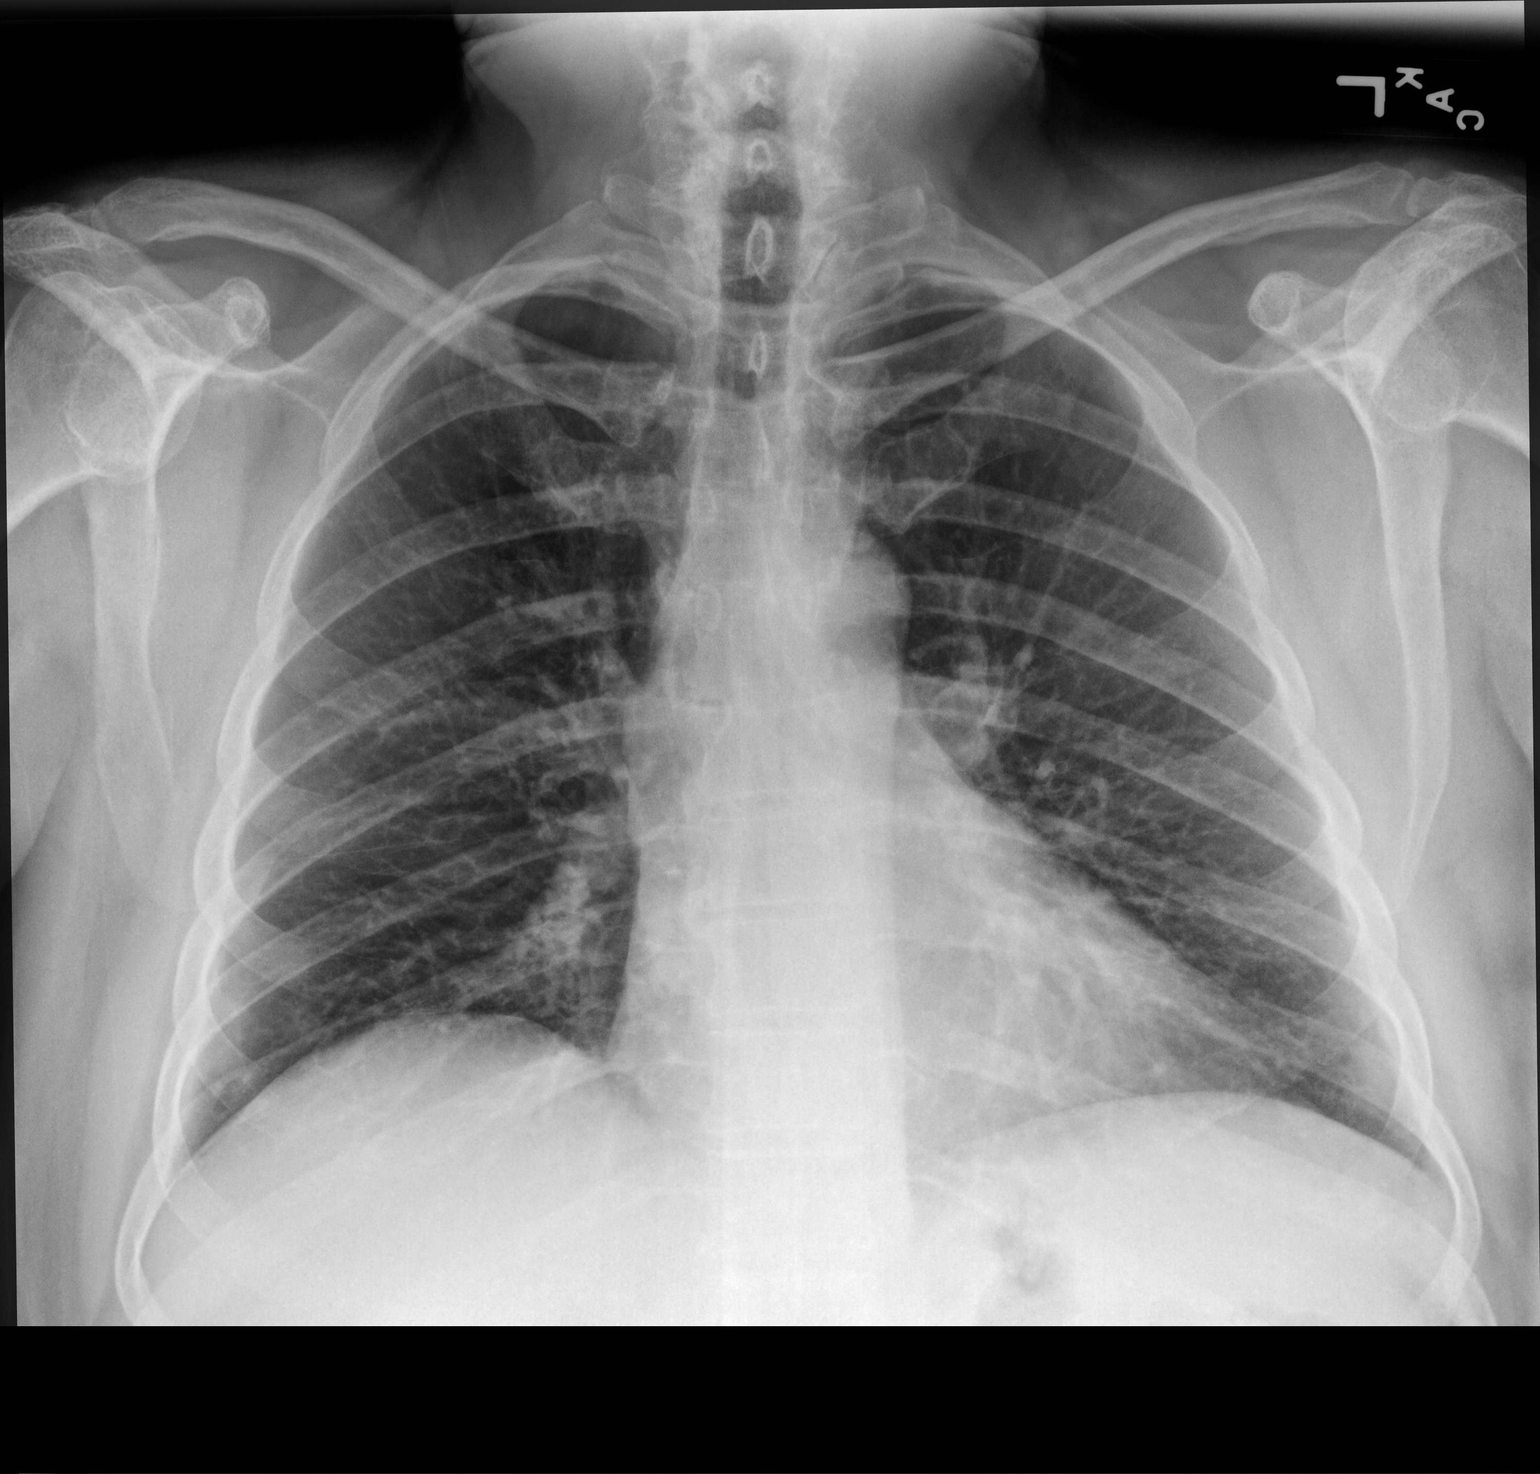

[chest lat]
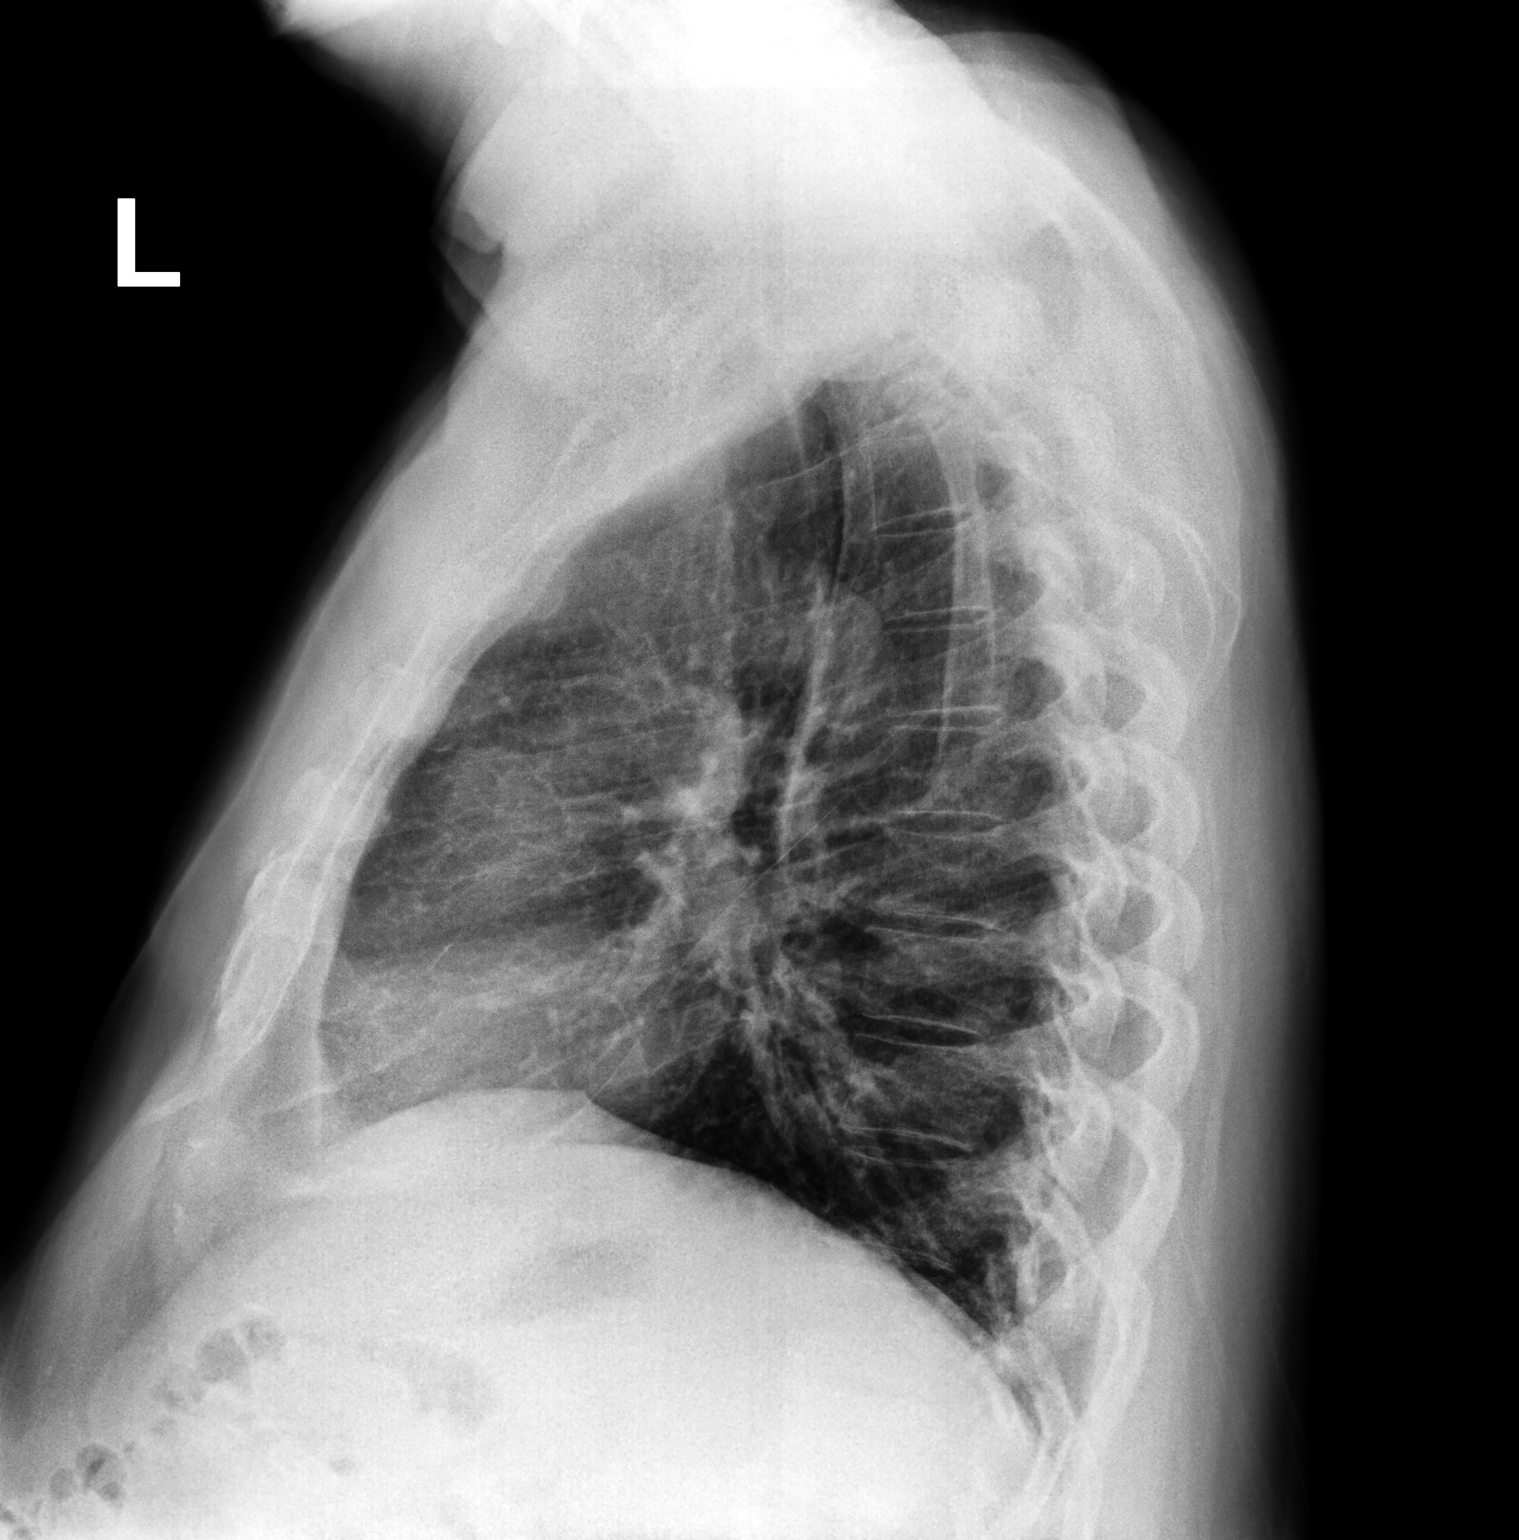

[2 of 2 positions shown; findings below may reference images not displayed]

FINDINGS: The heart size and mediastinal contours are within normal limits.
Both lungs are clear. The visualized skeletal structures are
unremarkable.
IMPRESSION: No active cardiopulmonary disease.

## 2023-04-10 ENCOUNTER — Ambulatory Visit: Payer: Medicare Other | Admitting: Pulmonary Disease

## 2023-04-17 ENCOUNTER — Encounter: Payer: Self-pay | Admitting: Pulmonary Disease

## 2023-04-17 ENCOUNTER — Ambulatory Visit: Admitting: Pulmonary Disease

## 2023-04-17 VITALS — BP 116/70 | HR 80 | Temp 97.7°F | Ht 70.0 in | Wt 237.6 lb

## 2023-04-17 DIAGNOSIS — K219 Gastro-esophageal reflux disease without esophagitis: Secondary | ICD-10-CM | POA: Diagnosis not present

## 2023-04-17 DIAGNOSIS — R058 Other specified cough: Secondary | ICD-10-CM

## 2023-04-17 DIAGNOSIS — J683 Other acute and subacute respiratory conditions due to chemicals, gases, fumes and vapors: Secondary | ICD-10-CM

## 2023-04-17 NOTE — Patient Instructions (Signed)
 VISIT SUMMARY:  Today, we discussed your chronic cough, gastroesophageal reflux disease (GERD), obesity, and diabetes management. We reviewed your symptoms, current treatments, and potential lifestyle changes to improve your overall health.  YOUR PLAN:  -CHRONIC COUGH: Your chronic cough is improving. Since the new inhaler did not make a significant difference and is expensive, you can continue using your previous inhaler as needed. Environmental factors and reflux may contribute to your symptoms, so consider taking Claritin or Zyrtec for any allergy symptoms.  -GASTROESOPHAGEAL REFLUX DISEASE (GERD): GERD is when stomach acid frequently flows back into the tube connecting your mouth and stomach. Your reflux symptoms are triggered by certain foods, especially tomato-based products. To manage this, avoid trigger foods and consider dietary modifications to reduce acidity in your meals.  -OBESITY: Obesity can reduce lung capacity and make physical exertion more difficult. Losing 30-40 pounds could improve your lung function and overall health. You have consulted a nutritionist, and we discussed the potential benefits of weight loss. Please discuss weight management strategies with your primary care physician and consider newer medications for diabetes that may aid in weight loss.  -DIABETES MELLITUS: Diabetes is a condition that affects how your body processes blood sugar. Your diabetes management is complicated by irregular eating schedules and high morning blood glucose levels. Weight loss and medication adjustments may help. Please consult with your primary care physician regarding potential medication adjustments for better diabetes management.  INSTRUCTIONS:  Please follow up with your primary care physician to discuss weight management strategies and potential medication adjustments for diabetes management.  We will see you here on an as-needed basis.

## 2023-04-17 NOTE — Progress Notes (Signed)
 Subjective:    Patient ID: Richard Case, male    DOB: 09-11-1955, 68 y.o.   MRN: [id number]  Patient Care Team: Claire Crick, MD as PCP - General (Family Medicine) Wenona Hamilton, MD as PCP - Cardiology (Cardiology) Marc Senior, MD as Consulting Physician (Pulmonary Disease)  Chief Complaint  Patient presents with   Follow-up    Occasional cough and wheezing. No shortness of breath.     BACKGROUND/INTERVAL: Patient is a 68 year old lifelong never smoker who follows here for the issue of a chronic cough. He was initially evaluated here on 11 October 2021. At that time it appear that his cough was related mostly to upper airway cough syndrome (postnasal drip). PFTs did not show significant abnormality except perhaps some very mild restriction due to obesity (ERV 28%) and minimal airway obstruction consistent with reactive airways. Since his initial evaluation the patient has engaged in weight loss which has helped him with perceived dyspnea on exertion while going up hills or stairs. Also has noted that his cough has improved following nasal hygiene and using Zyrtec at bedtime.  Most recent follow-up was on 09 December 2022 at that time he was instructed to continue as needed albuterol for reactive airways and continue nasal hygiene.  No exacerbations since his last visit.   HPI Discussed the use of AI scribe software for clinical note transcription with the patient, who gave verbal consent to proceed.  History of Present Illness   Richard Case "Myrtie Atkinson" is a 68 year old male who presents with chronic cough.  He has a chronic cough that has shown improvement, this is likely related to upper airway cough syndrome.  At his prior visit he was switched to Airsupra inhaler but did not notice any difference compared to his regular albuterol one and found it to be very expensive. He prefers to continue with his previous inhaler if needed.  He has not needed the rescue inhaler at all  over the last 2 months.  He experiences gastroesophageal reflux, particularly after consuming tomato-based foods, which exacerbates his symptoms. A recent episode involved significant discomfort after consuming a different tomato sauce, which he described as 'real acidy'. Sauces with more sugar, like Ragu, are less problematic for him.  He recalls being told years ago about having a low lung capacity, which affects his ability to exert himself physically. He sometimes forgets to breathe while walking or being active. He wants to lose 30 to 40 pounds, believing it would significantly improve his well-being.  He faces challenges with maintaining a regular eating schedule since retiring, which affects his blood sugar levels. Eating late often results in higher morning blood sugar readings.   Overall he feels that his issues with cough are very well-controlled as long as he maintains good nasal hygiene and management of GERD.  He is wearing respiratory protection when doing wood turning which he does as a hobby.  Does not endorse any other symptomatology.    DATA 10/31/2021 IgE/allergen panel: IgE 30, Allergen panel negative. 11/27/2021 PFTs: FEV1 2.75 L or 81% predicted, FVC 3.92 L or 86% predicted, FEV1/FVC 70% mild restriction on the basis of obesity (ERV 28%) diffusion capacity normal. Overall unremarkable PFTs with minimal airway obstruction   Review of Systems A 10 point review of systems was performed and it is as noted above otherwise negative.   Patient Active Problem List   Diagnosis Date Noted   History of colonic polyps 02/13/2023   Adenomatous polyp of colon 02/13/2023  ADHD 12/28/2022   Peripheral neuropathy 02/19/2022   Low serum vitamin B12 02/19/2022   Upper airway cough syndrome 09/28/2021   Carotid stenosis 06/12/2021   Advanced directives, counseling/discussion 06/06/2021   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 04/17/2021   Memory deficit 04/16/2021   Chronic  cough 06/28/2020   Burnout of caregiver 11/24/2019   Medicare annual wellness visit, subsequent 11/24/2019   Sensorineural hearing loss, bilateral 12/12/2018   Tinnitus 10/29/2017   Exertional dyspnea 07/23/2017   Radon exposure 04/04/2016   Complex renal cyst 06/03/2015   Hydronephrosis, right 04/19/2015   History of kidney stones 04/04/2015   Peyronie disease 04/04/2015   Health maintenance examination 03/28/2015   BPH (benign prostatic hyperplasia) 03/28/2015   Hyperlipidemia associated with type 2 diabetes mellitus (HCC)    Erectile dysfunction    OSA (obstructive sleep apnea)    Obesity, Class I, BMI 30-34.9    Seasonal allergic rhinitis    Gout    Type 2 diabetes mellitus with other specified complication (HCC)     Social History   Tobacco Use   Smoking status: Never   Smokeless tobacco: Never  Substance Use Topics   Alcohol use: Yes    Comment: once a month    Allergies  Allergen Reactions   Strattera [Atomoxetine Hcl] Other (See Comments)    Urinary retention    Current Meds  Medication Sig   albuterol (VENTOLIN HFA) 108 (90 Base) MCG/ACT inhaler TAKE 2 PUFFS BY MOUTH EVERY 6 HOURS AS NEEDED FOR WHEEZE OR SHORTNESS OF BREATH   allopurinol (ZYLOPRIM) 300 MG tablet Take 1 tablet (300 mg total) by mouth daily.   ALPHA LIPOIC ACID PO Take 1 tablet by mouth 3 (three) times daily.   aspirin 81 MG chewable tablet Chew 1 tablet (81 mg total) by mouth daily.   atorvastatin (LIPITOR) 40 MG tablet TAKE 1 TABLET BY MOUTH EVERY DAY   ibuprofen (ADVIL,MOTRIN) 200 MG tablet Take 400 mg by mouth every 6 (six) hours as needed for moderate pain.   loratadine (CLARITIN) 10 MG tablet Take 10 mg by mouth daily.   memantine (NAMENDA) 10 MG tablet Take 1 tablet (10 mg total) by mouth 2 (two) times daily. For memory   metFORMIN (GLUCOPHAGE) 500 MG tablet Take 1 tablet (500 mg total) by mouth daily with breakfast.   pentoxifylline (TRENTAL) 400 MG CR tablet Take 400 mg by mouth 3  (three) times daily.   pregabalin (LYRICA) 100 MG capsule PLEASE SEE ATTACHED FOR DETAILED DIRECTIONS   tadalafil (CIALIS) 5 MG tablet Take 5 mg by mouth daily as needed for erectile dysfunction.   tamsulosin (FLOMAX) 0.4 MG CAPS capsule TAKE 1 CAPSULE BY MOUTH EVERY DAY   thiamine (VITAMIN B1) 100 MG tablet Take 1 tablet (100 mg total) by mouth once a week.   triamcinolone cream (KENALOG) 0.1 % Apply topically as needed.   Vitamin D, Ergocalciferol, (DRISDOL) 1.25 MG (50000 UNIT) CAPS capsule Take 50,000 Units by mouth once a week.    Immunization History  Administered Date(s) Administered   Fluad Trivalent(High Dose 65+) 12/09/2022   Influenza Inj Mdck Quad Pf 01/15/2017   Influenza, High Dose Seasonal PF 11/08/2019   Influenza,inj,Quad PF,6+ Mos 01/27/2015, 10/29/2017, 11/03/2018   Influenza-Unspecified 12/15/2015   MODERNA COVID-19 SARS-COV-2 PEDS BIVALENT BOOSTER 71yr-73yr 03/18/2019, 04/14/2019, 06/16/2020   Pneumococcal Conjugate-13 11/24/2019   Pneumococcal Polysaccharide-23 01/27/2015   Rabies, IM 08/17/2014, 08/20/2014, 08/24/2014, 09/02/2014, 09/14/2014   Tdap 03/28/2015   Zoster Recombinant(Shingrix) 11/03/2018,  02/25/2019        Objective:     BP 116/70 (BP Location: Left Arm, Patient Position: Sitting, Cuff Size: Normal)   Pulse 80   Temp 97.7 F (36.5 C) (Temporal)   Ht 5\' 10"  (1.778 m)   Wt 237 lb 9.6 oz (107.8 kg)   SpO2 98%   BMI 34.09 kg/m   SpO2: 98 %  GENERAL: Well-developed, obese gentleman, no acute distress, fully ambulatory, no conversational dyspnea. HEAD: Normocephalic, atraumatic.  Scalp scar from prior squamous cell carcinoma resection well-healed. EYES: Pupils equal, round, reactive to light.  No scleral icterus.  MOUTH: Dentition intact, no thrush.  Oral mucosa moist. NECK: Supple. No thyromegaly. Trachea midline. No JVD.  No adenopathy. PULMONARY: Good air entry bilaterally.  No adventitious sounds. CARDIOVASCULAR: S1 and S2. Regular rate  and rhythm.  No rubs, murmurs or gallops heard. ABDOMEN: Obese otherwise benign. MUSCULOSKELETAL: No joint deformity, no clubbing, no edema.  NEUROLOGIC: No overt focal deficit, no gait disturbance, speech is fluent. SKIN: Intact,warm,dry.  Scar in the scalp posteriorly (Moh's Surgery) PSYCH: Mood and behavior normal   Assessment & Plan:     ICD-10-CM   1. Upper airway cough syndrome  R05.8     2. Reactive airways dysfunction syndrome (HCC)  J68.3     3. Laryngopharyngeal reflux (LPR)  K21.9      Discussion:    Chronic cough d/t upper airway cough syndrome Chronic cough is improving. No significant difference between the new and previous inhalers, and the new inhaler is expensive. Environmental factors and reflux may contribute to symptoms. - Continue current inhaler as needed - Consider Claritin or Zyrtec for allergy symptoms  Gastroesophageal reflux disease (GERD)/LPR Reflux symptoms are triggered by certain foods, particularly tomato-based products. Symptoms improve with dietary modifications such as reducing acidity and adding sugar to tomato sauces. - Avoid trigger foods, particularly tomato-based products - Consider dietary modifications to reduce acidity in meals  Obesity Obesity contributes to reduced lung capacity and overall exertion capacity. Weight loss could improve lung function and overall health. He desires to lose 30-40 pounds and has consulted a nutritionist. Discussed the potential benefit of weight loss in improving lung expansion and overall health. - Discuss weight management strategies with primary care physician - Consider newer medications for diabetes that may aid in weight loss  Diabetes mellitus Diabetes management is complicated by irregular eating schedules and high morning blood glucose levels. Weight loss and medication adjustments may be beneficial. Discussed the possibility of adding newer medications to aid in weight management and improve  diabetes control. - Consult with primary care physician regarding potential medication adjustments for diabetes management      Patient prefers to follow up on a as needed basis.  Advised to call if symptoms do not improve or worsen, to please contact office for sooner follow up or seek emergency care.    I spent 30 minutes of dedicated to the care of this patient on the date of this encounter to include pre-visit review of records, face-to-face time with the patient discussing conditions above, post visit ordering of testing, clinical documentation with the electronic health record, making appropriate referrals as documented, and communicating necessary findings to members of the patients care team.     C. Chloe Counter, MD Advanced Bronchoscopy PCCM [facility]    *This note was generated using voice recognition software/Dragon and/or AI transcription program.  Despite best efforts to proofread, errors can occur which can change the meaning. Any transcriptional  errors that result from this process are unintentional and may not be fully corrected at the time of dictation.

## 2023-04-28 ENCOUNTER — Encounter: Payer: Self-pay | Admitting: Pulmonary Disease

## 2023-06-24 ENCOUNTER — Ambulatory Visit (INDEPENDENT_AMBULATORY_CARE_PROVIDER_SITE_OTHER)

## 2023-06-24 VITALS — BP 116/70 | Ht 70.0 in | Wt 233.0 lb

## 2023-06-24 DIAGNOSIS — Z0000 Encounter for general adult medical examination without abnormal findings: Secondary | ICD-10-CM | POA: Diagnosis not present

## 2023-06-24 DIAGNOSIS — Z2821 Immunization not carried out because of patient refusal: Secondary | ICD-10-CM

## 2023-06-24 NOTE — Patient Instructions (Signed)
 Richard Case , Thank you for taking time out of your busy schedule to complete your Annual Wellness Visit with me. I enjoyed our conversation and look forward to speaking with you again next year. I, as well as your care team,  appreciate your ongoing commitment to your health goals. Please review the following plan we discussed and let me know if I can assist you in the future. Your Game plan/ To Do List    Referrals: If you haven't heard from the office you've been referred to, please reach out to them at the phone provided.  none Follow up Visits: Next Medicare AWV with our clinical staff: 06/24/2024   Have you seen your provider in the last 6 months (3 months if uncontrolled diabetes)? No Next Office Visit with your provider: 07/07/2023  Clinician Recommendations:  Aim for 30 minutes of exercise or brisk walking, 6-8 glasses of water, and 5 servings of fruits and vegetables each day.       This is a list of the screening recommended for you and due dates:  Health Maintenance  Topic Date Due   COVID-19 Vaccine (4 - 2024-25 season) 09/15/2022   Eye exam for diabetics  11/08/2022   Yearly kidney function blood test for diabetes  06/18/2023   Yearly kidney health urinalysis for diabetes  06/18/2023   Hemoglobin A1C  06/25/2023   Flu Shot  08/15/2023   Complete foot exam   12/25/2023   Medicare Annual Wellness Visit  06/23/2024   Pneumonia Vaccine (3 of 3 - PCV20 or PCV21) 11/23/2024   DTaP/Tdap/Td vaccine (2 - Td or Tdap) 03/27/2025   Colon Cancer Screening  02/13/2028   Hepatitis C Screening  Completed   Zoster (Shingles) Vaccine  Completed   HPV Vaccine  Aged Out   Meningitis B Vaccine  Aged Out    Advanced directives: (Declined) Advance directive discussed with you today. Even though you declined this today, please call our office should you change your mind, and we can give you the proper paperwork for you to fill out. Advance Care Planning is important because it:  [x]  Makes  sure you receive the medical care that is consistent with your values, goals, and preferences  [x]  It provides guidance to your family and loved ones and reduces their decisional burden about whether or not they are making the right decisions based on your wishes.  Follow the link provided in your after visit summary or read over the paperwork we have mailed to you to help you started getting your Advance Directives in place. If you need assistance in completing these, please reach out to us  so that we can help you!  See attachments for Preventive Care and Fall Prevention Tips.

## 2023-06-24 NOTE — Progress Notes (Signed)
 Because this visit was a virtual/telehealth visit,  certain criteria was not obtained, such a blood pressure, CBG if applicable, and timed get up and go. Any medications not marked as "taking" were not mentioned during the medication reconciliation part of the visit. Any vitals not documented were not able to be obtained due to this being a telehealth visit or patient was unable to self-report a recent blood pressure reading due to a lack of equipment at home via telehealth. Vitals that have been documented are verbally provided by the patient.   This visit was performed by a medical professional under my direct supervision. I was immediately available for consultation/collaboration. I have reviewed and agree with the Annual Wellness Visit documentation.  Subjective:   Richard Case is a 68 y.o. who presents for a Medicare Wellness preventive visit.  As a reminder, Annual Wellness Visits don't include a physical exam, and some assessments may be limited, especially if this visit is performed virtually. We may recommend an in-person follow-up visit with your provider if needed.  Visit Complete: Virtual I connected with  Richard Case on 06/24/23 by a audio enabled telemedicine application and verified that I am speaking with the correct person using two identifiers.  Patient Location: Home  Provider Location: Home Office  I discussed the limitations of evaluation and management by telemedicine. The patient expressed understanding and agreed to proceed.  Vital Signs: Because this visit was a virtual/telehealth visit, some criteria may be missing or patient reported. Any vitals not documented were not able to be obtained and vitals that have been documented are patient reported.  VideoDeclined- This patient declined Librarian, academic. Therefore the visit was completed with audio only.  Persons Participating in Visit: Patient.  AWV Questionnaire: Yes: Patient  Medicare AWV questionnaire was completed by the patient on 06/20/2023; I have confirmed that all information answered by patient is correct and no changes since this date.  Cardiac Risk Factors include: advanced age (>56men, >85 women);male gender;hypertension;obesity (BMI >30kg/m2);diabetes mellitus;dyslipidemia     Objective:     Today's Vitals   06/24/23 1441  BP: 116/70  Weight: 233 lb (105.7 kg)  Height: 5\' 10"  (1.778 m)   Body mass index is 33.43 kg/m.     06/24/2023    2:47 PM 02/26/2023    2:16 PM 02/13/2023    7:58 AM 01/30/2023    8:05 AM 05/24/2021   11:53 AM 12/18/2017    8:49 AM 09/02/2014    9:16 AM  Advanced Directives  Does Patient Have a Medical Advance Directive? No No No No No No No  Would patient like information on creating a medical advance directive? No - Patient declined No - Patient declined  No - Patient declined  No - Patient declined No - patient declined information    Current Medications (verified) Outpatient Encounter Medications as of 06/24/2023  Medication Sig   albuterol  (VENTOLIN  HFA) 108 (90 Base) MCG/ACT inhaler TAKE 2 PUFFS BY MOUTH EVERY 6 HOURS AS NEEDED FOR WHEEZE OR SHORTNESS OF BREATH   allopurinol  (ZYLOPRIM ) 300 MG tablet Take 1 tablet (300 mg total) by mouth daily.   ALPHA LIPOIC ACID PO Take 1 tablet by mouth 3 (three) times daily.   aspirin 81 MG chewable tablet Chew 1 tablet (81 mg total) by mouth daily.   atorvastatin  (LIPITOR) 40 MG tablet TAKE 1 TABLET BY MOUTH EVERY DAY   ibuprofen (ADVIL,MOTRIN) 200 MG tablet Take 400 mg by mouth every 6 (six) hours as needed  for moderate pain.   loratadine (CLARITIN) 10 MG tablet Take 10 mg by mouth daily.   memantine (NAMENDA) 10 MG tablet Take 1 tablet (10 mg total) by mouth 2 (two) times daily. For memory   metFORMIN  (GLUCOPHAGE ) 500 MG tablet Take 1 tablet (500 mg total) by mouth daily with breakfast.   pentoxifylline (TRENTAL) 400 MG CR tablet Take 400 mg by mouth 3 (three) times daily.    pregabalin (LYRICA) 100 MG capsule PLEASE SEE ATTACHED FOR DETAILED DIRECTIONS   tadalafil  (CIALIS ) 5 MG tablet Take 5 mg by mouth daily as needed for erectile dysfunction.   tamsulosin  (FLOMAX ) 0.4 MG CAPS capsule TAKE 1 CAPSULE BY MOUTH EVERY DAY   thiamine  (VITAMIN B1) 100 MG tablet Take 1 tablet (100 mg total) by mouth once a week.   triamcinolone  cream (KENALOG ) 0.1 % Apply topically as needed.   Vitamin D, Ergocalciferol, (DRISDOL) 1.25 MG (50000 UNIT) CAPS capsule Take 50,000 Units by mouth once a week.   No facility-administered encounter medications on file as of 06/24/2023.    Allergies (verified) Strattera [atomoxetine hcl]   History: Past Medical History:  Diagnosis Date   ADD (attention deficit disorder) 2011   no records of workup; strattera caused urinary retention, not interested in habit forming medication   Atrial ectopy 2011   improved with CPAP, documented by holter   Benign colon polyp ?2014   hyperplastic   BPH (benign prostatic hypertrophy)    per prior PCP records   Diabetes mellitus without complication (HCC)    Elevated blood pressure (not hypertension)    Erectile dysfunction    GERD (gastroesophageal reflux disease)    Gout    Heart murmur longstanding   History of kidney stones 2008, 2017   History of pneumonia 2009   HLD (hyperlipidemia)    Kidney stones    HX   Male erectile dysfunction    Obesity, Class I, BMI 30-34.9    OSA (obstructive sleep apnea) 2010   OSA with stabilization at CPAP 7cm   Peyronie disease    per prior pcp records   Polycythemia    ?OSA related   Prediabetes    Rosacea    per prior pcp records   Seasonal allergic rhinitis    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   Past Surgical History:  Procedure Laterality Date   BASAL CELL CARCINOMA EXCISION Right    right ear, 08/2022   CARDIOVASCULAR STRESS TEST  07/2017   low risk stress test, hypertensive response (End)   COLONOSCOPY  2009   rec rpt 5  yrs per prior PCP records but no actual report   COLONOSCOPY  03/2012   mild diverticulosis, rpt 5 yrs (Dr Adeline Hone in [place])   COLONOSCOPY  01/2023   TA,HP, diverticulosis rpt 5 yrs Antony Baumgartner)   COLONOSCOPY WITH PROPOFOL  N/A 12/18/2017   TAx2, HP, diverticulosis, rpt 5 yrs Antony Baumgartner, Lenton Rail, MD)   COLONOSCOPY WITH PROPOFOL  N/A 02/13/2023   Procedure: COLONOSCOPY WITH PROPOFOL ;  Surgeon: Luke Salaam, MD;  Location: [facility] ENDOSCOPY;  Service: Gastroenterology;  Laterality: N/A;   EXTRACORPOREAL SHOCK WAVE LITHOTRIPSY Right 04/06/2015   Procedure: EXTRACORPOREAL SHOCK WAVE LITHOTRIPSY (ESWL);  Surgeon: Bart Born, MD;  Location: [facility] ORS;  Service: Urology;  Laterality: Right;   head surgery     01/2021, squamos cell carcinoma   MOHS SURGERY  2015   SCC of nose   POLYPECTOMY  02/13/2023   Procedure: POLYPECTOMY;  Surgeon: Antony Baumgartner,  Lenton Rail, MD;  Location: [facility] ENDOSCOPY;  Service: Gastroenterology;;   TONSILLECTOMY     Family History  Problem Relation Age of Onset   Cancer Father 78       prostate   CAD Father        possibly?   Prostate cancer Father    Heart Problems Father    Cancer Mother        lung   Lung disease Mother    Stroke Neg Hx    Diabetes Neg Hx    Kidney disease Neg Hx    Kidney cancer Neg Hx    Bladder Cancer Neg Hx    Social History   Socioeconomic History   Marital status: Married    Spouse name: Not on file   Number of children: Not on file   Years of education: Not on file   Highest education level: Bachelor's degree (e.g., BA, AB, BS)  Occupational History   Not on file  Tobacco Use   Smoking status: Never   Smokeless tobacco: Never  Vaping Use   Vaping status: Never Used  Substance and Sexual Activity   Alcohol use: Yes    Comment: once a month   Drug use: No   Sexual activity: Not on file  Other Topics Concern   Not on file  Social History Narrative   Lives with wife [name], 1 dog   Occupation: retired - Sport and exercise psychologist for [employer]   Edu:  BS   Activity: active in yard   Diet: good water, fruits/vegetables daily   Social Drivers of Corporate investment banker Strain: Low Risk  (06/24/2023)   Overall Financial Resource Strain (CARDIA)    Difficulty of Paying Living Expenses: Not hard at all  Food Insecurity: No Food Insecurity (06/24/2023)   Hunger Vital Sign    Worried About Running Out of Food in the Last Year: Never true    Ran Out of Food in the Last Year: Never true  Transportation Needs: No Transportation Needs (06/24/2023)   PRAPARE - Administrator, Civil Service (Medical): No    Lack of Transportation (Non-Medical): No  Physical Activity: Insufficiently Active (06/24/2023)   Exercise Vital Sign    Days of Exercise per Week: 5 days    Minutes of Exercise per Session: 20 min  Stress: No Stress Concern Present (06/24/2023)   [employer] of Occupational Health - Occupational Stress Questionnaire    Feeling of Stress : Only a little  Social Connections: Unknown (06/24/2023)   Social Connection and Isolation Panel [NHANES]    Frequency of Communication with Friends and Family: Patient declined    Frequency of Social Gatherings with Friends and Family: Patient declined    Attends Religious Services: More than 4 times per year    Active Member of [employer] or Organizations: Yes    Attends Engineer, structural: More than 4 times per year    Marital Status: Married    Tobacco Counseling Counseling given: Not Answered    Clinical Intake:  Pre-visit preparation completed: Yes  Pain : No/denies pain     BMI - recorded: 33.43 Nutritional Status: BMI > 30  Obese Nutritional Risks: None Diabetes: Yes CBG done?: No Did pt. bring in CBG monitor from home?: No  Lab Results  Component Value Date   HGBA1C 6.0 (A) 12/25/2022   HGBA1C 6.2 06/18/2022   HGBA1C 6.2 (A) 02/19/2022     How often do you need to have someone  help you when you read instructions, pamphlets, or other written  materials from your doctor or pharmacy?: 1 - Never What is the last grade level you completed in school?: degree  Interpreter Needed?: No  Information entered by :: [email or web address]   Activities of Daily Living     06/20/2023   10:21 AM  In your present state of health, do you have any difficulty performing the following activities:  Hearing? 1  Vision? 1  Difficulty concentrating or making decisions? 1  Walking or climbing stairs? 1  Dressing or bathing? 0  Doing errands, shopping? 0  Preparing Food and eating ? N  Using the Toilet? N  In the past six months, have you accidently leaked urine? Y  Do you have problems with loss of bowel control? N  Managing your Medications? Y  Managing your Finances? N  Housekeeping or managing your Housekeeping? N    Patient Care Team: Claire Crick, MD as PCP - General (Family Medicine) Wenona Hamilton, MD as PCP - Cardiology (Cardiology) Marc Senior, MD as Consulting Physician (Pulmonary Disease)  I have updated your Care Teams any recent Medical Services you may have received from other providers in the past year.     Assessment:    This is a routine wellness examination for Richard Case.  Hearing/Vision screen Hearing Screening - Comments:: No hearing difficulties Vision Screening - Comments:: Patient wears glasses   Goals Addressed             This Visit's Progress    Patient Stated   On track    05/24/2021, wants to work on weight and keep sugar under control       Depression Screen     06/24/2023    2:49 PM 02/26/2023    2:19 PM 12/25/2022    3:24 PM 06/18/2022   10:32 AM 02/19/2022    4:09 PM 05/24/2021   11:56 AM 11/24/2019    3:31 PM  PHQ 2/9 Scores  PHQ - 2 Score 0 0  0 0 0 3  PHQ- 9 Score 0   8 9  8   Exception Documentation   Patient refusal        Fall Risk     06/20/2023   10:21 AM 02/26/2023    2:16 PM 06/18/2022   10:32 AM 02/19/2022    4:09 PM 05/24/2021   11:54 AM  Fall Risk   Falls in the  past year? 1 1 0 1 0  Number falls in past yr: 0 0  0 0  Injury with Fall? 1 1  0 0  Risk for fall due to : History of fall(s)    Impaired balance/gait;Medication side effect  Follow up Falls evaluation completed    Falls evaluation completed;Education provided;Falls prevention discussed    MEDICARE RISK AT HOME:  Medicare Risk at Home Any stairs in or around the home?: (Patient-Rptd) Yes If so, are there any without handrails?: (Patient-Rptd) No Adequate lighting in your home to reduce risk of falls?: (Patient-Rptd) No Life alert?: (Patient-Rptd) No Use of a cane, walker or w/c?: (Patient-Rptd) No Grab bars in the bathroom?: No Shower chair or bench in shower?: (Patient-Rptd) No Elevated toilet seat or a handicapped toilet?: (Patient-Rptd) No  TIMED UP AND GO:  Was the test performed?  No  Cognitive Function: 6CIT completed        06/24/2023    2:45 PM 05/24/2021   12:01 PM  6CIT Screen  What Year? 0 points 0  points  What month? 0 points 0 points  What time? 0 points 0 points  Count back from 20 0 points 0 points  Months in reverse 0 points 0 points  Repeat phrase 0 points 0 points  Total Score 0 points 0 points    Immunizations Immunization History  Administered Date(s) Administered   Fluad Trivalent(High Dose 65+) 12/09/2022   Influenza Inj Mdck Quad Pf 01/15/2017   Influenza, High Dose Seasonal PF 11/08/2019   Influenza,inj,Quad PF,6+ Mos 01/27/2015, 10/29/2017, 11/03/2018   Influenza-Unspecified 12/15/2015   MODERNA COVID-19 SARS-COV-2 PEDS BIVALENT BOOSTER 48yr-67yr 03/18/2019, 04/14/2019, 06/16/2020   Pneumococcal Conjugate-13 11/24/2019   Pneumococcal Polysaccharide-23 01/27/2015   Rabies, IM 08/17/2014, 08/20/2014, 08/24/2014, 09/02/2014, 09/14/2014   Tdap 03/28/2015   Zoster Recombinant(Shingrix ) 11/03/2018, 02/25/2019    Screening Tests Health Maintenance  Topic Date Due   COVID-19 Vaccine (4 - 2024-25 season) 09/15/2022   OPHTHALMOLOGY EXAM   11/08/2022   Diabetic kidney evaluation - eGFR measurement  06/18/2023   Diabetic kidney evaluation - Urine ACR  06/18/2023   HEMOGLOBIN A1C  06/25/2023   INFLUENZA VACCINE  08/15/2023   FOOT EXAM  12/25/2023   Medicare Annual Wellness (AWV)  06/23/2024   Pneumonia Vaccine 42+ Years old (3 of 3 - PCV20 or PCV21) 11/23/2024   DTaP/Tdap/Td (2 - Td or Tdap) 03/27/2025   Colonoscopy  02/13/2028   Hepatitis C Screening  Completed   Zoster Vaccines- Shingrix   Completed   HPV VACCINES  Aged Out   Meningococcal B Vaccine  Aged Out    Health Maintenance  Health Maintenance Due  Topic Date Due   COVID-19 Vaccine (4 - 2024-25 season) 09/15/2022   OPHTHALMOLOGY EXAM  11/08/2022   Diabetic kidney evaluation - eGFR measurement  06/18/2023   Diabetic kidney evaluation - Urine ACR  06/18/2023   Health Maintenance Items Addressed: Patient declined health maintenance   Additional Screening:  Vision Screening: Recommended annual ophthalmology exams for early detection of glaucoma and other disorders of the eye. Would you like a referral to an eye doctor? No    Dental Screening: Recommended annual dental exams for proper oral hygiene  Community Resource Referral / Chronic Care Management: CRR required this visit?  No   CCM required this visit?  No   Plan:    I have personally reviewed and noted the following in the patient's chart:   Medical and social history Use of alcohol, tobacco or illicit drugs  Current medications and supplements including opioid prescriptions. Patient is not currently taking opioid prescriptions. Functional ability and status Nutritional status Physical activity Advanced directives List of other physicians Hospitalizations, surgeries, and ER visits in previous 12 months Vitals Screenings to include cognitive, depression, and falls Referrals and appointments  In addition, I have reviewed and discussed with patient certain preventive protocols, quality  metrics, and best practice recommendations. A written personalized care plan for preventive services as well as general preventive health recommendations were provided to patient.   Freeda Jerry, [place]   06/24/2023   After Visit Summary: (MyChart) Due to this being a telephonic visit, the after visit summary with patients personalized plan was offered to patient via MyChart   Notes: Nothing significant to report at this time.

## 2023-07-07 ENCOUNTER — Encounter: Payer: Self-pay | Admitting: Family Medicine

## 2023-07-07 ENCOUNTER — Ambulatory Visit (INDEPENDENT_AMBULATORY_CARE_PROVIDER_SITE_OTHER): Payer: Medicare Other | Admitting: Family Medicine

## 2023-07-07 VITALS — BP 132/80 | HR 72 | Temp 98.2°F | Ht 70.0 in | Wt 234.1 lb

## 2023-07-07 DIAGNOSIS — F909 Attention-deficit hyperactivity disorder, unspecified type: Secondary | ICD-10-CM

## 2023-07-07 DIAGNOSIS — E785 Hyperlipidemia, unspecified: Secondary | ICD-10-CM

## 2023-07-07 DIAGNOSIS — G4733 Obstructive sleep apnea (adult) (pediatric): Secondary | ICD-10-CM

## 2023-07-07 DIAGNOSIS — Z125 Encounter for screening for malignant neoplasm of prostate: Secondary | ICD-10-CM

## 2023-07-07 DIAGNOSIS — R058 Other specified cough: Secondary | ICD-10-CM

## 2023-07-07 DIAGNOSIS — E559 Vitamin D deficiency, unspecified: Secondary | ICD-10-CM | POA: Insufficient documentation

## 2023-07-07 DIAGNOSIS — M109 Gout, unspecified: Secondary | ICD-10-CM | POA: Diagnosis not present

## 2023-07-07 DIAGNOSIS — Z7189 Other specified counseling: Secondary | ICD-10-CM

## 2023-07-07 DIAGNOSIS — E538 Deficiency of other specified B group vitamins: Secondary | ICD-10-CM | POA: Diagnosis not present

## 2023-07-07 DIAGNOSIS — E66811 Obesity, class 1: Secondary | ICD-10-CM

## 2023-07-07 DIAGNOSIS — J302 Other seasonal allergic rhinitis: Secondary | ICD-10-CM

## 2023-07-07 DIAGNOSIS — R413 Other amnesia: Secondary | ICD-10-CM

## 2023-07-07 DIAGNOSIS — Z794 Long term (current) use of insulin: Secondary | ICD-10-CM

## 2023-07-07 DIAGNOSIS — E1169 Type 2 diabetes mellitus with other specified complication: Secondary | ICD-10-CM

## 2023-07-07 DIAGNOSIS — G6289 Other specified polyneuropathies: Secondary | ICD-10-CM

## 2023-07-07 LAB — CBC WITH DIFFERENTIAL/PLATELET
Basophils Absolute: 0 10*3/uL (ref 0.0–0.1)
Basophils Relative: 0.8 % (ref 0.0–3.0)
Eosinophils Absolute: 0.1 10*3/uL (ref 0.0–0.7)
Eosinophils Relative: 2.1 % (ref 0.0–5.0)
HCT: 49.3 % (ref 39.0–52.0)
Hemoglobin: 16.6 g/dL (ref 13.0–17.0)
Lymphocytes Relative: 26.5 % (ref 12.0–46.0)
Lymphs Abs: 1.7 10*3/uL (ref 0.7–4.0)
MCHC: 33.7 g/dL (ref 30.0–36.0)
MCV: 92.9 fl (ref 78.0–100.0)
Monocytes Absolute: 0.8 10*3/uL (ref 0.1–1.0)
Monocytes Relative: 11.9 % (ref 3.0–12.0)
Neutro Abs: 3.8 10*3/uL (ref 1.4–7.7)
Neutrophils Relative %: 58.7 % (ref 43.0–77.0)
Platelets: 210 10*3/uL (ref 150.0–400.0)
RBC: 5.31 Mil/uL (ref 4.22–5.81)
RDW: 12.8 % (ref 11.5–15.5)
WBC: 6.4 10*3/uL (ref 4.0–10.5)

## 2023-07-07 LAB — COMPREHENSIVE METABOLIC PANEL WITH GFR
ALT: 26 U/L (ref 0–53)
AST: 22 U/L (ref 0–37)
Albumin: 4.4 g/dL (ref 3.5–5.2)
Alkaline Phosphatase: 85 U/L (ref 39–117)
BUN: 20 mg/dL (ref 6–23)
CO2: 29 meq/L (ref 19–32)
Calcium: 9.8 mg/dL (ref 8.4–10.5)
Chloride: 102 meq/L (ref 96–112)
Creatinine, Ser: 1.27 mg/dL (ref 0.40–1.50)
GFR: 57.84 mL/min — ABNORMAL LOW (ref >60.00)
Glucose, Bld: 110 mg/dL — ABNORMAL HIGH (ref 70–99)
Potassium: 4.5 meq/L (ref 3.5–5.1)
Sodium: 139 meq/L (ref 135–145)
Total Bilirubin: 1.1 mg/dL (ref 0.2–1.2)
Total Protein: 7.3 g/dL (ref 6.0–8.3)

## 2023-07-07 LAB — HEMOGLOBIN A1C: Hgb A1c MFr Bld: 6.3 % (ref 4.6–6.5)

## 2023-07-07 LAB — VITAMIN B12: Vitamin B-12: 710 pg/mL (ref 211–911)

## 2023-07-07 LAB — LIPID PANEL
Cholesterol: 119 mg/dL (ref 0–200)
HDL: 37.4 mg/dL — ABNORMAL LOW (ref >39.00)
LDL Cholesterol: 60 mg/dL (ref 0–99)
NonHDL: 82.08
Total CHOL/HDL Ratio: 3
Triglycerides: 111 mg/dL (ref 0.0–149.0)
VLDL: 22.2 mg/dL (ref 0.0–40.0)

## 2023-07-07 LAB — PSA, MEDICARE: PSA: 1.81 ng/mL (ref 0.10–4.00)

## 2023-07-07 LAB — MICROALBUMIN / CREATININE URINE RATIO
Creatinine,U: 90.7 mg/dL
Microalb Creat Ratio: 9.8 mg/g (ref 0.0–30.0)
Microalb, Ur: 0.9 mg/dL (ref 0.0–1.9)

## 2023-07-07 LAB — URIC ACID: Uric Acid, Serum: 5.2 mg/dL (ref 4.0–7.8)

## 2023-07-07 LAB — VITAMIN D 25 HYDROXY (VIT D DEFICIENCY, FRACTURES): VITD: 81.43 ng/mL (ref 30.00–100.00)

## 2023-07-07 MED ORDER — ALLOPURINOL 300 MG PO TABS: 300.0000 mg | ORAL_TABLET | Freq: Every day | ORAL | 4 refills | Status: DC

## 2023-07-07 MED ORDER — ATORVASTATIN CALCIUM 40 MG PO TABS: 40.0000 mg | ORAL_TABLET | Freq: Every day | ORAL | 4 refills | Status: DC

## 2023-07-07 MED ORDER — VITAMIN D (ERGOCALCIFEROL) 1.25 MG (50000 UNIT) PO CAPS: 50000.0000 [IU] | ORAL_CAPSULE | ORAL | 1 refills | Status: DC

## 2023-07-07 MED ORDER — VITAMIN D (ERGOCALCIFEROL) 1.25 MG (50000 UNIT) PO CAPS: 50000.0000 [IU] | ORAL_CAPSULE | ORAL | 3 refills | Status: DC

## 2023-07-07 MED ORDER — METFORMIN HCL 500 MG PO TABS
500.0000 mg | ORAL_TABLET | Freq: Every day | ORAL | 4 refills | Status: DC
Start: 2023-07-07 — End: 2023-07-28

## 2023-07-07 NOTE — Patient Instructions (Addendum)
 Labs today  Schedule diabetic eye exam.  Return in 6 months for diabetes follow up visit.

## 2023-07-07 NOTE — Progress Notes (Unsigned)
 Ph: [phone] Fax: [phone]   Patient ID: Richard Case, male    DOB: May 20, 1955, 68 y.o.   MRN: [id number]  This visit was conducted in person.  BP 132/80   Pulse 72   Temp 98.2 F (36.8 C) (Oral)   Ht 5' 10 (1.778 m)   Wt 234 lb 2 oz (106.2 kg)   SpO2 96%   BMI 33.59 kg/m    CC: AMW f/u visit  Subjective:   HPI: Richard Case is a 68 y.o. male presenting on 07/07/2023 for Annual Exam (MCR prt 2 [AWV- 06/24/23]. )   Saw health advisor earlier this month for medicare wellness visit. Note reviewed.   No results found.  Flowsheet Row Office Visit from 07/07/2023 in [facility] [place]  PHQ-2 Total Score 2       07/07/2023    9:33 AM 06/20/2023   10:21 AM 02/26/2023    2:16 PM 06/18/2022   10:32 AM 02/19/2022    4:09 PM  Fall Risk   Falls in the past year? 1 1 1  0 1  Number falls in past yr: 1 0 0  0  Injury with Fall? 0 1 1  0  Risk for fall due to :  History of fall(s)     Follow up  Falls evaluation completed     [place] Hit [place], evaluated 1 wk later.   Previously saw neurology Dr Aundra in [facility]. He was treated with memantine 10mg  bid as well as Ritalin for possible ADHD dx, Noted improvement. He also started him on pentoxifylline for circulation.    Saw 2nd opinion [facility] neurology Maryl Allyson Stallion NP - [facility] - continue memantine 10mg  bid, to consider CPAP. Insurance not covering Ritalin. Lower back pain - referred to PT in [place].   Saw pulmonology Dr Tamea for upper airway cough syndrome, RAD, LPR. Notes cetirizine has been the most helpful, also on flonase. No f/u planned.   Notes ongoing trouble with peripheral neuropathy - ALA supplement hasn't been too helpful. He also has had trouble fetting pregabalin 100mg  approved (started by neuro Dr Aundra).   Pentoxifylline is prescribed by neurology. Feels this has helped his scalp heal.  Neurology also prescribes  namenda 10mg  BID and ritalin 10mg  BID.   OSA - not using CPAP at all. He is planning to try oral appliance through dentist.    Diabetic on metformin  500mg  daily. Checks sugars fasting 110-150s.  Vitamin B1 8 (low normal) 02/2022. Not interested in weekly shots.  Lab Results  Component Value Date   HGBA1C 6.0 (A) 12/25/2022     Preventative: COLONOSCOPY 01/2023 - TA,HP, diverticulosis rpt 5 yrs Romero) Prostate cancer screening - sees urology yearly [facility]) - requests PSA given fmhx Lung cancer screening - not eligible  Flu shot yearly  Pneumovax 2017, prevnar-13 11/2019 COVID vaccine Moderna 03/2019 x2, booster 06/2020  Tdap 2017  Shingrix  - 10/2018, 02/2019  Advanced directive planning - does not have this. Would want wife [name] to be HCPOA. Packet provided previously. Planning to hire attorney. Asked to bring us  a copy.  Seat belt use discussed  Sunscreen use discussed. No changing moles on skin. Sees dermatology Dr Jama in [place].  Non smoker  Alcohol - rarely  Dentist q6 mo  Eye exam - yearly - needs return one  Bowel - occ constipation  Bladder - occ incontinence  - on flomax   Lives with  wife [name], 1 dog Occupation: retired - Sport and exercise psychologist for [employer]  Edu: BS Activity: walking dog  Diet: good water, fruits/vegetables daily      Relevant past medical, surgical, family and social history reviewed and updated as indicated. Interim medical history since our last visit reviewed. Allergies and medications reviewed and updated. Outpatient Medications Prior to Visit  Medication Sig Dispense Refill   albuterol  (VENTOLIN  HFA) 108 (90 Base) MCG/ACT inhaler TAKE 2 PUFFS BY MOUTH EVERY 6 HOURS AS NEEDED FOR WHEEZE OR SHORTNESS OF BREATH 18 each 1   ALPHA LIPOIC ACID PO Take 1 tablet by mouth 3 (three) times daily.     aspirin 81 MG chewable tablet Chew 1 tablet (81 mg total) by mouth daily.     cetirizine (ZYRTEC) 10 MG tablet Take 10 mg by mouth daily.     fluticasone  (FLONASE) 50 MCG/ACT nasal spray Place 2 sprays into both nostrils daily as needed for allergies or rhinitis.     ibuprofen (ADVIL,MOTRIN) 200 MG tablet Take 400 mg by mouth every 6 (six) hours as needed for moderate pain.     memantine (NAMENDA) 10 MG tablet Take 1 tablet (10 mg total) by mouth 2 (two) times daily. For memory     pentoxifylline (TRENTAL) 400 MG CR tablet Take 400 mg by mouth 3 (three) times daily.     pregabalin (LYRICA) 100 MG capsule PLEASE SEE ATTACHED FOR DETAILED DIRECTIONS     tadalafil  (CIALIS ) 5 MG tablet Take 5 mg by mouth daily as needed for erectile dysfunction.     tamsulosin  (FLOMAX ) 0.4 MG CAPS capsule TAKE 1 CAPSULE BY MOUTH EVERY DAY 90 capsule 3   thiamine  (VITAMIN B1) 100 MG tablet Take 1 tablet (100 mg total) by mouth once a week.     triamcinolone  cream (KENALOG ) 0.1 % Apply topically as needed. 30 g 0   allopurinol  (ZYLOPRIM ) 300 MG tablet Take 1 tablet (300 mg total) by mouth daily. 90 tablet 4   atorvastatin  (LIPITOR) 40 MG tablet TAKE 1 TABLET BY MOUTH EVERY DAY 90 tablet 0   loratadine (CLARITIN) 10 MG tablet Take 10 mg by mouth daily.     metFORMIN  (GLUCOPHAGE ) 500 MG tablet Take 1 tablet (500 mg total) by mouth daily with breakfast. 90 tablet 4   Vitamin D, Ergocalciferol, (DRISDOL) 1.25 MG (50000 UNIT) CAPS capsule Take 50,000 Units by mouth once a week.     No facility-administered medications prior to visit.     Per HPI unless specifically indicated in ROS section below Review of Systems  Objective:  BP 132/80   Pulse 72   Temp 98.2 F (36.8 C) (Oral)   Ht 5' 10 (1.778 m)   Wt 234 lb 2 oz (106.2 kg)   SpO2 96%   BMI 33.59 kg/m   Wt Readings from Last 3 Encounters:  07/07/23 234 lb 2 oz (106.2 kg)  06/24/23 233 lb (105.7 kg)  04/17/23 237 lb 9.6 oz (107.8 kg)      Physical Exam Vitals and nursing note reviewed.  Constitutional:      General: He is not in acute distress.    Appearance: Normal appearance. He is well-developed. He  is not ill-appearing.  HENT:     Head: Normocephalic and atraumatic.     Right Ear: Hearing, tympanic membrane, ear canal and external ear normal.     Left Ear: Hearing, tympanic membrane, ear canal and external ear normal.     Mouth/Throat:  Mouth: Mucous membranes are moist.     Pharynx: Oropharynx is clear. No oropharyngeal exudate or posterior oropharyngeal erythema.   Eyes:     General: No scleral icterus.    Extraocular Movements: Extraocular movements intact.     Conjunctiva/sclera: Conjunctivae normal.     Pupils: Pupils are equal, round, and reactive to light.   Neck:     Thyroid : No thyroid  mass or thyromegaly.     Vascular: No carotid bruit.   Cardiovascular:     Rate and Rhythm: Normal rate and regular rhythm.     Pulses: Normal pulses.          Radial pulses are 2+ on the right side and 2+ on the left side.     Heart sounds: Normal heart sounds. No murmur heard. Pulmonary:     Effort: Pulmonary effort is normal. No respiratory distress.     Breath sounds: Normal breath sounds. No wheezing, rhonchi or rales.  Abdominal:     General: Bowel sounds are normal. There is no distension.     Palpations: Abdomen is soft. There is no mass.     Tenderness: There is no abdominal tenderness. There is no guarding or rebound.     Hernia: No hernia is present.   Musculoskeletal:        General: Normal range of motion.     Cervical back: Normal range of motion and neck supple.     Right lower leg: No edema.     Left lower leg: No edema.  Lymphadenopathy:     Cervical: No cervical adenopathy.   Skin:    General: Skin is warm and dry.     Findings: No rash.   Neurological:     General: No focal deficit present.     Mental Status: He is alert and oriented to person, place, and time.   Psychiatric:        Mood and Affect: Mood normal.        Behavior: Behavior normal.        Thought Content: Thought content normal.        Judgment: Judgment normal.       Results  for orders placed or performed during the hospital encounter of 02/13/23  Surgical pathology   Collection Time: 02/13/23 12:00 AM  Result Value Ref Range   SURGICAL PATHOLOGY      SURGICAL PATHOLOGY [facility] [address] [place], [place] [address] Telephone [phone] or [phone] Fax [phone]  REPORT OF SURGICAL PATHOLOGY   Accession #: [id number] Patient Name: TRAMEL, WESTBROOK Visit # : [id number]  MRN: [id number] Physician: Therisa Bi DOB/Age Feb 10, 1955 (Age: 61) Gender: M Collected Date: 02/13/2023 Received Date: 02/13/2023  FINAL DIAGNOSIS       1. Colon, polyp(s), Cold snare cecum x2, transverse x1 :       - TUBULAR ADENOMA (1).      - HYPERPLASTIC POLYP (MULTIPLE FRAGMENTS).      - NEGATIVE FOR HIGH-GRADE DYSPLASIA AND MALIGNANCY.       DATE SIGNED OUT: 02/14/2023 ELECTRONIC SIGNATURE : Janel Md, Rexene , Pathologist, Electronic Signature  MICROSCOPIC DESCRIPTION  CASE COMMENTS STAINS USED IN DIAGNOSIS: H&E    CLINICAL HISTORY  SPECIMEN(S) OBTAINED 1. Colon, polyp(s), Cold Snare Cecum X2, Transverse X1  SPECIMEN COMMENTS: SPECIMEN CLINICAL INFORMATION: 1. History of  colon polyps.  Diverticulosis, colon polyps    Gross Description 1. Cold snare polyp x2 cecum/polyp x1 transverse colon, received in formalin  0.9 x 0.3 x 0.1 cm aggregate of multiple tan-gray, soft tissue fragments. The specimen is filtered and submitted in toto in 1 block (1A).      AMG 02/13/2023        Report signed out from the following location(s) [facility] [address] [place], [place] [address] CLIA #: [id number]  [facility] [address] [place], [place] [address] CLIA #: [id number]   Glucose, capillary   Collection Time: 02/13/23  8:06 AM  Result Value Ref Range   Glucose-Capillary 135 (H) 70 - 99 mg/dL    Assessment & Plan:   Problem List Items Addressed This Visit      Advanced directives, counseling/discussion - Primary (Chronic)   Advanced directive planning - does not have this. Would want wife [name] to be HCPOA. Packet provided previously. Planning to hire attorney. Asked to bring us  a copy.       Gout   Relevant Medications   allopurinol  (ZYLOPRIM ) 300 MG tablet   Other Relevant Orders   Uric acid   Type 2 diabetes mellitus with other specified complication (HCC)   Relevant Medications   atorvastatin  (LIPITOR) 40 MG tablet   metFORMIN  (GLUCOPHAGE ) 500 MG tablet   Other Relevant Orders   Hemoglobin A1c   Microalbumin / creatinine urine ratio   Vitamin B12   Hyperlipidemia associated with type 2 diabetes mellitus (HCC)   Relevant Medications   atorvastatin  (LIPITOR) 40 MG tablet   metFORMIN  (GLUCOPHAGE ) 500 MG tablet   Other Relevant Orders   Lipid panel   Comprehensive metabolic panel with GFR   Low serum vitamin B12   Relevant Orders   Vitamin B12   CBC with Differential/Platelet   Vitamin D deficiency   Relevant Orders   VITAMIN D 25 Hydroxy (Vit-D Deficiency, Fractures)   Other Visit Diagnoses       Special screening for malignant neoplasm of prostate       Relevant Orders   PSA, Medicare        Meds ordered this encounter  Medications   allopurinol  (ZYLOPRIM ) 300 MG tablet    Sig: Take 1 tablet (300 mg total) by mouth daily.    Dispense:  90 tablet    Refill:  4   atorvastatin  (LIPITOR) 40 MG tablet    Sig: Take 1 tablet (40 mg total) by mouth daily.    Dispense:  90 tablet    Refill:  4   metFORMIN  (GLUCOPHAGE ) 500 MG tablet    Sig: Take 1 tablet (500 mg total) by mouth daily with breakfast.    Dispense:  90 tablet    Refill:  4   DISCONTD: Vitamin D, Ergocalciferol, (DRISDOL) 1.25 MG (50000 UNIT) CAPS capsule    Sig: Take 1 capsule (50,000 Units total) by mouth once a week.    Dispense:  12 capsule    Refill:  3   Vitamin D, Ergocalciferol, (DRISDOL) 1.25 MG (50000 UNIT) CAPS capsule    Sig: Take 1 capsule  (50,000 Units total) by mouth once a week.    Dispense:  12 capsule    Refill:  1    Orders Placed This Encounter  Procedures   Hemoglobin A1c   Microalbumin / creatinine urine ratio   Lipid panel   Comprehensive metabolic panel with GFR   Uric acid   Vitamin B12   PSA, Medicare   VITAMIN D 25 Hydroxy (Vit-D Deficiency, Fractures)   CBC with Differential/Platelet  Patient Instructions  Labs today  Schedule diabetic eye exam.  Return in 6 months for diabetes follow up visit.   Follow up plan: Return in about 6 months (around 01/06/2024) for follow up visit.  Anton Blas, MD

## 2023-07-07 NOTE — Assessment & Plan Note (Signed)
Advanced directive planning - does not have this. Would want wife Richard Case to be HCPOA. Packet provided previously. Planning to hire attorney. Asked to bring [place] a copy

## 2023-07-09 ENCOUNTER — Ambulatory Visit: Payer: Self-pay | Admitting: Family Medicine

## 2023-07-09 NOTE — Assessment & Plan Note (Signed)
 Sees neurology on namenda 10mg  bid and ritalin - latest saw [facility].

## 2023-07-09 NOTE — Assessment & Plan Note (Signed)
 CPAP intolerance. Planning to seek evaluation for oral appliance through local dentist

## 2023-07-09 NOTE — Assessment & Plan Note (Addendum)
 Saw pulm, rec antihistamine and PRN flonase.

## 2023-07-09 NOTE — Assessment & Plan Note (Signed)
 Ongoing difficulty, ALA supplementation hasn't helped.  Previously gabapentin not helpful. Continue pregabalin started by Dr Aundra, but has had trouble filling due to insurance coverage.

## 2023-07-09 NOTE — Assessment & Plan Note (Signed)
 Update levels on vit D 50k units weekly.

## 2023-07-09 NOTE — Assessment & Plan Note (Addendum)
 Chronic, stable period on metformin  500mg  daily - continue. Update labs today.  Declines injectable GLP1RA.

## 2023-07-09 NOTE — Assessment & Plan Note (Signed)
Continue cetirizine 

## 2023-07-09 NOTE — Assessment & Plan Note (Signed)
 Diagnosed by neurology Dr Aundra 2024, treated with ritalin

## 2023-07-09 NOTE — Assessment & Plan Note (Signed)
 Continue to encourage healthy diet and lifestyle choices to affect sustainable weight loss.

## 2023-07-09 NOTE — Assessment & Plan Note (Signed)
Update levels off replacement. 

## 2023-07-09 NOTE — Assessment & Plan Note (Signed)
 Update uric acid on allopurinol  300mg  daily - no recent gout flares.

## 2023-07-09 NOTE — Assessment & Plan Note (Signed)
 Chronic, stable period on atorvastatin  40mg  - update FLP. The ASCVD Risk score (Arnett DK, et al., 2019) failed to calculate for the following reasons:   The valid total cholesterol range is 130 to 320 mg/dL

## 2023-07-26 ENCOUNTER — Other Ambulatory Visit: Payer: Self-pay | Admitting: Family Medicine

## 2023-07-26 DIAGNOSIS — E1169 Type 2 diabetes mellitus with other specified complication: Secondary | ICD-10-CM

## 2023-07-29 ENCOUNTER — Ambulatory Visit: Payer: Medicare Other | Admitting: Dietician

## 2023-08-04 ENCOUNTER — Other Ambulatory Visit: Payer: Self-pay | Admitting: Urology

## 2023-08-04 DIAGNOSIS — N401 Enlarged prostate with lower urinary tract symptoms: Secondary | ICD-10-CM

## 2023-08-12 ENCOUNTER — Encounter: Payer: Self-pay | Admitting: Dietician

## 2023-08-12 ENCOUNTER — Encounter: Attending: Family Medicine | Admitting: Dietician

## 2023-08-12 DIAGNOSIS — E1169 Type 2 diabetes mellitus with other specified complication: Secondary | ICD-10-CM | POA: Insufficient documentation

## 2023-08-12 DIAGNOSIS — Z713 Dietary counseling and surveillance: Secondary | ICD-10-CM | POA: Diagnosis not present

## 2023-08-12 NOTE — Patient Instructions (Addendum)
 Continue to work on lowering your fasting blood sugar to under 130 mg/dL daily!  Set a reminder in the afternoon to decide on plans for dinner each day at 4:00 PM.  Try having a Sugar-Free Jello, or Jello pudding for a sweet snack later in the evening.  Try having a Premiere Protein or Pure Protein shake in place of the Boost for a higher protein, lower calorie options.  Call to schedule your follow up at [phone]

## 2023-08-12 NOTE — Progress Notes (Signed)
 Diabetes Self-Management Education  Visit Type: Follow-up  Appt. Start Time: 1445 Appt. End Time: 1525  08/12/2023  [name], identified by name and date of birth, is a 68 y.o. male with a diagnosis of Diabetes:  .   ASSESSMENT Visit was conducted on Teacher, English as a foreign language Location: Office Patient Location: Home  Pt reports continuing metformin  @500mg , no GI side effects, B12 levels remain in range (07/07/2023 - 710 pg/mL). Pt reports checking FBG daily, values range from <130 - 150 mg/dL, higher when eating late the night before. Pt reports weight loss of ~8 lbs in the last few weeks, states they have been active outside in their garden and sweating a lot. Pt reports continuing neuropathy, mostly in the morning, but occasionally wakes pt at night, reports they need a renewed prescription of Pregabalin, hasn't taken this medication in 3-4 weeks. Pt reports cutting their own toenails, has podiatrist but has not seen them due to lack of need. Pt reports continued lack of structure in their daily life since retirement. Pt reports difficulties maintaining healthy meals for dinner and eating early enough, will find themselves eating at 7:00 - 8:00 PM, states time gets away from them. Pt report they will binge watch TV series with their wife in the evening and end up going to bed later than they would like to.   Diabetes Self-Management Education - 08/12/23 1546       Visit Information   Visit Type Follow-up      Psychosocial Assessment   Patient Belief/Attitude about Diabetes Motivated to manage diabetes    Other persons present Patient    Learning Readiness Ready      Pre-Education Assessment   Patient understands the diabetes disease and treatment process. Comprehends key points    Patient understands incorporating nutritional management into lifestyle. Comprehends key points    Patient undertands incorporating physical activity into lifestyle. Comprehends key  points    Patient understands using medications safely. Demonstrates understanding / competency    Patient understands monitoring blood glucose, interpreting and using results Comprehends key points    Patient understands prevention, detection, and treatment of acute complications. N/A (comment)    Patient understands prevention, detection, and treatment of chronic complications. Compreheands key points    Patient understands how to develop strategies to address psychosocial issues. Needs Review    Patient understands how to develop strategies to promote health/change behavior. Needs Review      Complications   Last HgB A1C per patient/outside source 6.3 %   07/07/2023   How often do you check your blood sugar? 1-2 times/day    Fasting Blood glucose range (mg/dL) 869-820    Are you checking your feet? Yes    How many days per week are you checking your feet? 7      Dietary Intake   Breakfast Protein shake    Lunch Pimento cheese on whole wheat    Dinner Pork loin, Quinoa, green beans and corn, tomato slice      Activity / Exercise   Activity / Exercise Type ADL's;Light (walking / raking leaves)   Gardening, Walking dog     Individualized Goals (developed by patient)   Nutrition Follow meal plan discussed    Medications take my medication as prescribed    Monitoring  Test my blood glucose as discussed    Problem Solving Eating Pattern;Sleep Pattern;Addressing barriers to behavior change    Reducing Risk examine blood glucose patterns;do foot checks daily  Patient Self-Evaluation of Goals - Patient rates self as meeting previously set goals (% of time)   Nutrition 25 - 50% (sometimes)    Physical Activity 25 - 50% (sometimes)    Medications >75% (most of the time)    Monitoring >75% (most of the time)    Problem Solving and behavior change strategies  25 - 50% (sometimes)    Reducing Risk (treating acute and chronic complications) 25 - 50% (sometimes)    Health Coping 25 - 50%  (sometimes)      Post-Education Assessment   Patient understands the diabetes disease and treatment process. Comprehends key points    Patient understands incorporating nutritional management into lifestyle. Comprehends key points    Patient undertands incorporating physical activity into lifestyle. Comprehends key points    Patient understands using medications safely. Demonstrates understanding / competency    Patient understands monitoring blood glucose, interpreting and using results Comprehends key points    Patient understands prevention, detection, and treatment of acute complications. N/A    Patient understands prevention, detection, and treatment of chronic complications. Comprehends key points    Patient understands how to develop strategies to address psychosocial issues. Comprehends key points    Patient understands how to develop strategies to promote health/change behavior. Needs Review      Outcomes   Expected Outcomes Demonstrated interest in learning. Expect positive outcomes    Future DMSE 3-4 months    Program Status Not Completed      Subsequent Visit   Since your last visit have you continued or begun to take your medications as prescribed? Yes    Since your last visit have you had your blood pressure checked? Yes    Is your most recent blood pressure lower, unchanged, or higher since your last visit? Higher    Since your last visit have you experienced any weight changes? Loss    Weight Loss (lbs) 8    Since your last visit, are you checking your blood glucose at least once a day? Yes          Individualized Plan for Diabetes Self-Management Training:   Learning Objective:  Patient will have a greater understanding of diabetes self-management. Patient education plan is to attend individual and/or group sessions per assessed needs and concerns.   Plan:   Patient Instructions  Continue to work on lowering your fasting blood sugar to under 130 mg/dL  daily!  Set a reminder in the afternoon to decide on plans for dinner each day at 4:00 PM.  Try having a Sugar-Free Jello, or Jello pudding for a sweet snack later in the evening.  Try having a Premiere Protein or Pure Protein shake in place of the Boost for a higher protein, lower calorie options.  Call to schedule your follow up at [phone]  Expected Outcomes:  Demonstrated interest in learning. Expect positive outcomes  If problems or questions, patient to contact team via:  Phone and Email  Future DSME appointment: 3-4 months

## 2023-08-28 ENCOUNTER — Ambulatory Visit

## 2023-08-28 VITALS — BP 108/78 | HR 68 | Temp 97.8°F | Ht 70.0 in | Wt 226.0 lb

## 2023-08-28 DIAGNOSIS — M109 Gout, unspecified: Secondary | ICD-10-CM

## 2023-08-28 DIAGNOSIS — N401 Enlarged prostate with lower urinary tract symptoms: Secondary | ICD-10-CM

## 2023-08-28 DIAGNOSIS — R3912 Poor urinary stream: Secondary | ICD-10-CM

## 2023-08-28 DIAGNOSIS — Z7984 Long term (current) use of oral hypoglycemic drugs: Secondary | ICD-10-CM | POA: Diagnosis not present

## 2023-08-28 DIAGNOSIS — E1169 Type 2 diabetes mellitus with other specified complication: Secondary | ICD-10-CM

## 2023-08-28 DIAGNOSIS — E119 Type 2 diabetes mellitus without complications: Secondary | ICD-10-CM

## 2023-08-28 DIAGNOSIS — G609 Hereditary and idiopathic neuropathy, unspecified: Secondary | ICD-10-CM

## 2023-08-28 DIAGNOSIS — I6529 Occlusion and stenosis of unspecified carotid artery: Secondary | ICD-10-CM

## 2023-08-28 DIAGNOSIS — R058 Other specified cough: Secondary | ICD-10-CM

## 2023-08-28 DIAGNOSIS — E785 Hyperlipidemia, unspecified: Secondary | ICD-10-CM

## 2023-08-28 DIAGNOSIS — R413 Other amnesia: Secondary | ICD-10-CM

## 2023-08-28 DIAGNOSIS — Z79899 Other long term (current) drug therapy: Secondary | ICD-10-CM | POA: Insufficient documentation

## 2023-08-28 DIAGNOSIS — G894 Chronic pain syndrome: Secondary | ICD-10-CM | POA: Insufficient documentation

## 2023-08-28 MED ORDER — ALLOPURINOL 300 MG PO TABS: 300.0000 mg | ORAL_TABLET | Freq: Every day | ORAL | 3 refills | Status: AC

## 2023-08-28 MED ORDER — ATORVASTATIN CALCIUM 40 MG PO TABS: 40.0000 mg | ORAL_TABLET | Freq: Every day | ORAL | 3 refills | Status: AC

## 2023-08-28 MED ORDER — TRIAMCINOLONE ACETONIDE 0.1 % EX CREA: TOPICAL_CREAM | CUTANEOUS | 0 refills | Status: AC | PRN

## 2023-08-28 MED ORDER — MELATONIN 1 MG PO TABS: 1.0000 mg | ORAL_TABLET | Freq: Every day | ORAL | 1 refills | Status: AC

## 2023-08-28 MED ORDER — PENTOXIFYLLINE ER 400 MG PO TBCR: 400.0000 mg | EXTENDED_RELEASE_TABLET | Freq: Three times a day (TID) | ORAL | 0 refills | Status: DC

## 2023-08-28 MED ORDER — MEMANTINE HCL 10 MG PO TABS: 10.0000 mg | ORAL_TABLET | Freq: Two times a day (BID) | ORAL | 0 refills | Status: DC

## 2023-08-28 MED ORDER — METFORMIN HCL 500 MG PO TABS
500.0000 mg | ORAL_TABLET | Freq: Every day | ORAL | 3 refills | Status: AC
Start: 2023-08-28 — End: ?

## 2023-08-28 NOTE — Assessment & Plan Note (Signed)
 A1c has been stable on Metformin  500 mg daily, continue.

## 2023-08-28 NOTE — Assessment & Plan Note (Addendum)
 Lab Results  Component Value Date   HGBA1C 6.3 07/07/2023   HGBA1C 6.0 (A) 12/25/2022   HGBA1C 6.2 06/18/2022  Stable with Metformin  500 mg daily, continue.  Recommend staying hydrated.  Check CMP today.

## 2023-08-28 NOTE — Assessment & Plan Note (Addendum)
 Managed by [facility] neurology.  Taking Lyrica 100 mg TID. If patient prefers to take 100 mg in AM and 200 mg in PM I recommend discussing this with neurology as well and be cautious with potential s/e.  Discuss neuropathy management and potential new treatment with SaeboStim foot Bath with his neurology as I am not familiar or comfortable in prescribing this. I will look into this, FDA approval for neuropathy management with this and consider discussion with the patient during f/u visit.  Consider melatonin 1 mg an hour before bedtime to aid sleep. Discussed referral to physical therapy for potential benefit in neuropathy management but patient declined.

## 2023-08-28 NOTE — Assessment & Plan Note (Signed)
 Managed by [facility] urology

## 2023-08-28 NOTE — Assessment & Plan Note (Addendum)
-   Managed by [facility] neurology.  - Taking Lyrica 100 mg TID. If patient prefers to take 100 mg in AM and 200 mg in PM I recommend discussing this with neurology as well and be cautious with potential s/e.  - Discuss neuropathy management and potential new treatment with SaeboStim foot Bath with his neurology as I am not familiar or comfortable in prescribing this. I will look into this, FDA approval for neuropathy management with this and consider discussion with the patient during f/u visit.  -Consider melatonin 1 mg an hour before bedtime to aid sleep. Discussed referral to physical therapy for potential benefit in neuropathy management but patient declined.  - He has also noticed improvement in symptoms with pentoxifylline  400 mg three times a day which was previously started by his vascular surgeon. I did send refill on this  for 90 days. Follow up with vascular surgery for ongoing management and prescription refills.

## 2023-08-28 NOTE — Assessment & Plan Note (Signed)
 Recommend regular f/u with neurology at [facility]. Needs refill on Memantine  10 mg BID, future refill through neurology

## 2023-08-28 NOTE — Patient Instructions (Signed)
-   Start taking over the counter vitamin D , 1000 units daily.

## 2023-08-28 NOTE — Progress Notes (Signed)
 New Patient Office Visit Previous patient of Dr. Rilla presenting for TOC/new patient to me.    Subjective   Patient ID: Richard Case, male    DOB: 02/19/1955  Age: 68 y.o. MRN: [id number]  CC:  Chief Complaint  Patient presents with   Establish Care    HPI Richard Case presents to establish care. He  has a past medical history of ADD (attention deficit disorder) (2011), Allergy (Prior to 2016), Atrial ectopy (2011), Benign colon polyp (?2014), BPH (benign prostatic hypertrophy), Chronic cough (06/28/2020), Diabetes mellitus without complication (HCC), Elevated blood pressure (not hypertension), Erectile dysfunction, GERD (gastroesophageal reflux disease), Gout, Heart murmur (longstanding), History of kidney stones (2008, 2017), History of pneumonia (2009), HLD (hyperlipidemia), Kidney stones, Male erectile dysfunction, Neuromuscular disorder (HCC), Obesity, Class I, BMI 30-34.9, OSA (obstructive sleep apnea) (2010), Peyronie disease, Polycythemia, Prediabetes, Rosacea, Seasonal allergic rhinitis, and 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  HPI Discussed the use of AI scribe software for clinical note transcription with the patient, who gave verbal consent to proceed.  History of Present Illness Richard Case is a 68 year old male who presents to establish care.   - He has idiopathic neuropathy, causing burning sensations in his feet, especially at night, which disrupts his sleep. He takes pregabalin three times a day but often misses dose during the day time. He occasionally uses melatonin to aid sleep. He has seen [facility] neurology for this in the past. He has tried a [employer] officer in the past which helped in improvement of neuropathy symptoms. He reports the cost to buy this is about $6000 and is requesting I can prescribe this for him.   - He experiences memory issues and takes memantine  twice daily. He previously found  methylphenidate helpful, but insurance issues have prevented its continued use. He has not seen a neurologist recently. He plans on following up with them. He needs refill on Memantine  10 mg BID.   - He has a history of sleep apnea and previously used a CPAP machine, which he found intolerable. He considered using a mouth guard like his wife but faced insurance challenges. He is not currently using any treatment for sleep apnea. He is not looking to reestablish with sleep specialist at this time.   - He experiences seasonal allergies, primarily during pollen season and when exposed to dust and wood particles due to his woodworking hobby. He takes cetirizine daily, which helps manage his symptoms, and occasionally uses Flonase nasal spray.  - He has a history of gout, with the last flare-up over ten years ago, managed effectively with daily allopurinol .   -  He takes pentoxifylline , prescribed by a vascular doctor, to help with circulation during exertion. He needs refill. He f/u with vascular surgery department at [facility] for surveillance for carotid artery disease with carotid duplex ultrasound annually. He also takes Aspirin 81 mg daily.   - He takes atorvastatin  40 mg daily for cholesterol management and metformin  500 mg daily for diabetes, with his blood sugar levels generally well-controlled. He monitors his blood sugar regularly, aiming to keep it below 130 mg/dL.  - He has a history of skin cancer on his head and follows up with a dermatologist twice a year.   - He also has a family history of prostate cancer and f/u with urology and gets PSA checked every 6 monthly.   - He uses triamcinolone  cream as needed for itchy spots and dry skin, particularly  after insect bites. He also takes thiamine  and occasionally uses ibuprofen for muscle pain.  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 He engages in woodworking as a hobby and [place].   Outpatient Encounter Medications as of 08/28/2023  Medication Sig   albuterol  (VENTOLIN  HFA) 108 (90 Base) MCG/ACT inhaler TAKE 2 PUFFS BY MOUTH EVERY 6 HOURS AS NEEDED FOR WHEEZE OR SHORTNESS OF BREATH   ALPHA LIPOIC ACID PO Take 1 tablet by mouth 3 (three) times daily.   aspirin 81 MG chewable tablet Chew 1 tablet (81 mg total) by mouth daily.   cetirizine (ZYRTEC) 10 MG tablet Take 10 mg by mouth daily.   fluticasone (FLONASE) 50 MCG/ACT nasal spray Place 2 sprays into both nostrils daily as needed for allergies or rhinitis.   melatonin 1 MG TABS tablet Take 1 tablet (1 mg total) by mouth at bedtime.   pregabalin (LYRICA) 100 MG capsule PLEASE SEE ATTACHED FOR DETAILED DIRECTIONS   tadalafil  (CIALIS ) 5 MG tablet Take 5 mg by mouth daily as needed for erectile dysfunction.   tamsulosin  (FLOMAX ) 0.4 MG CAPS capsule TAKE 1 CAPSULE BY MOUTH EVERY DAY   thiamine  (VITAMIN B1) 100 MG tablet Take 1 tablet (100 mg total) by mouth once a week.   [DISCONTINUED] ibuprofen (ADVIL,MOTRIN) 200 MG tablet Take 400 mg by mouth every 6 (six) hours as needed for moderate pain.   [DISCONTINUED] pentoxifylline  (TRENTAL ) 400 MG CR tablet Take 400 mg by mouth 3 (three) times daily.   [DISCONTINUED] triamcinolone  cream (KENALOG ) 0.1 % Apply topically as needed.   [DISCONTINUED] Vitamin D , Ergocalciferol , (DRISDOL ) 1.25 MG (50000 UNIT) CAPS capsule Take 1 capsule (50,000 Units total) by mouth once a week.   allopurinol  (ZYLOPRIM ) 300 MG tablet Take 1 tablet (300 mg total) by mouth daily.   atorvastatin  (LIPITOR) 40 MG tablet Take 1 tablet (40 mg total) by mouth daily.   memantine  (NAMENDA ) 10 MG tablet Take 1 tablet (10 mg total) by mouth 2 (two) times daily. For memory   metFORMIN  (GLUCOPHAGE ) 500 MG tablet Take 1 tablet (500 mg total) by mouth daily with breakfast.   pentoxifylline  (TRENTAL ) 400 MG CR tablet Take 1 tablet (400 mg total) by mouth 3 (three) times daily.   triamcinolone  cream  (KENALOG ) 0.1 % Apply topically as needed.   [DISCONTINUED] allopurinol  (ZYLOPRIM ) 300 MG tablet Take 1 tablet (300 mg total) by mouth daily.   [DISCONTINUED] atorvastatin  (LIPITOR) 40 MG tablet Take 1 tablet (40 mg total) by mouth daily.   [DISCONTINUED] memantine  (NAMENDA ) 10 MG tablet Take 1 tablet (10 mg total) by mouth 2 (two) times daily. For memory   [DISCONTINUED] metFORMIN  (GLUCOPHAGE ) 500 MG tablet TAKE 1 TABLET BY MOUTH EVERY DAY WITH BREAKFAST   No facility-administered encounter medications on file as of 08/28/2023.    Past Surgical History:  Procedure Laterality Date   BASAL CELL CARCINOMA EXCISION Right    right ear, 08/2022   CARDIOVASCULAR STRESS TEST  07/2017   low risk stress test, hypertensive response (End)   COLON SURGERY  Jan 2025   Polyps removed   COLONOSCOPY  2009   rec rpt 5 yrs per prior PCP records but no actual report   COLONOSCOPY  03/2012   mild diverticulosis, rpt 5 yrs (Dr Arron in [place])   COLONOSCOPY  01/2023   TA,HP, diverticulosis rpt 5 yrs Romero)   COLONOSCOPY WITH PROPOFOL  N/A 12/18/2017  TAx2, HP, diverticulosis, rpt 5 yrs Romero, Ruel, MD)   COLONOSCOPY WITH PROPOFOL  N/A 02/13/2023   Procedure: COLONOSCOPY WITH PROPOFOL ;  Surgeon: Therisa Ruel, MD;  Location: [facility] ENDOSCOPY;  Service: Gastroenterology;  Laterality: N/A;   COSMETIC SURGERY  01/2021  Scalp repair.  Needs more in future.   EXTRACORPOREAL SHOCK WAVE LITHOTRIPSY Right 04/06/2015   Procedure: EXTRACORPOREAL SHOCK WAVE LITHOTRIPSY (ESWL);  Surgeon: Redell Lynwood Napoleon, MD;  Location: [facility] ORS;  Service: Urology;  Laterality: Right;   head surgery     01/2021, squamos cell carcinoma   MOHS SURGERY  2015   SCC of nose   POLYPECTOMY  02/13/2023   Procedure: POLYPECTOMY;  Surgeon: Therisa Ruel, MD;  Location: [facility] ENDOSCOPY;  Service: Gastroenterology;;   TONSILLECTOMY      ROS As per HPI    Objective    BP 108/78 (BP Location: Right Arm, Patient Position: Sitting, Cuff Size:  Normal)   Pulse 68   Temp 97.8 F (36.6 C) (Oral)   Ht 5' 10 (1.778 m)   Wt 226 lb (102.5 kg)   SpO2 93%   BMI 32.43 kg/m     08/28/2023    1:21 PM 07/07/2023    9:34 AM 06/24/2023    2:49 PM  Depression screen PHQ 2/9  Decreased Interest 0 0 0  Down, Depressed, Hopeless 0 2 0  PHQ - 2 Score 0 2 0  Altered sleeping 3 3 0  Tired, decreased energy 3 3 0  Change in appetite 3 2 0  Feeling bad or failure about yourself  0 0 0  Trouble concentrating 1 2 0  Moving slowly or fidgety/restless 2 0 0  Suicidal thoughts 0 0 0  PHQ-9 Score 12 12 0  Difficult doing work/chores Not difficult at all Somewhat difficult Not difficult at all      08/28/2023    1:22 PM 07/07/2023    9:34 AM 06/18/2022   10:33 AM 02/19/2022    4:09 PM  GAD 7 : Generalized Anxiety Score  Nervous, Anxious, on Edge 1 0 0 0  Control/stop worrying 0 0 0 0  Worry too much - different things 0 2 2 0  Trouble relaxing 1 3 2  0  Restless 1 2 0 0  Easily annoyed or irritable 1 2 2  0  Afraid - awful might happen 0 0 0 0  Total GAD 7 Score 4 9 6  0  Anxiety Difficulty Not difficult at all Not difficult at all  Somewhat difficult    SDOH Screenings   Food Insecurity: No Food Insecurity (07/04/2023)  Housing: Low Risk  (07/04/2023)  Transportation Needs: No Transportation Needs (07/04/2023)  Utilities: Not At Risk (06/24/2023)  Alcohol Screen: Low Risk  (07/04/2023)  Depression (PHQ2-9): High Risk (08/28/2023)  Financial Resource Strain: Medium Risk (07/04/2023)  Physical Activity: Insufficiently Active (07/04/2023)  Social Connections: Moderately Integrated (07/04/2023)  Stress: Stress Concern Present (07/04/2023)  Tobacco Use: Low Risk  (08/28/2023)  Health Literacy: Adequate Health Literacy (06/24/2023)     Physical Exam Constitutional:      General: He is not in acute distress.    Appearance: Normal appearance.  HENT:     Head: Normocephalic and atraumatic.     Right Ear: Tympanic membrane normal.     Left Ear:  Tympanic membrane normal.     Mouth/Throat:     Mouth: Mucous membranes are moist.     Pharynx: Oropharynx is clear.  Neck:     Thyroid : No thyroid  mass  or thyroid  tenderness.  Cardiovascular:     Rate and Rhythm: Normal rate and regular rhythm.  Pulmonary:     Effort: Pulmonary effort is normal.     Breath sounds: Normal breath sounds. No wheezing or rales.  Abdominal:     General: Abdomen is protuberant. Bowel sounds are normal.     Palpations: Abdomen is soft.     Tenderness: There is no guarding or rebound.  Musculoskeletal:     Cervical back: Neck supple. No rigidity.     Right lower leg: No edema.     Left lower leg: No edema.  Skin:    General: Skin is warm.  Neurological:     Mental Status: He is alert and oriented to person, place, and time.  Psychiatric:        Mood and Affect: Mood normal.        Behavior: Behavior normal.        Lab Results  Component Value Date   TSH 0.76 02/19/2022   Lab Results  Component Value Date   WBC 6.4 07/07/2023   HGB 16.6 07/07/2023   HCT 49.3 07/07/2023   MCV 92.9 07/07/2023   PLT 210.0 07/07/2023   Lab Results  Component Value Date   NA 139 07/07/2023   K 4.5 07/07/2023   CO2 29 07/07/2023   GLUCOSE 110 (H) 07/07/2023   BUN 20 07/07/2023   CREATININE 1.27 07/07/2023   BILITOT 1.1 07/07/2023   ALKPHOS 85 07/07/2023   AST 22 07/07/2023   ALT 26 07/07/2023   PROT 7.3 07/07/2023   ALBUMIN 4.4 07/07/2023   CALCIUM  9.8 07/07/2023   GFR 57.84 (L) 07/07/2023   Lab Results  Component Value Date   CHOL 119 07/07/2023   CHOL 120 06/18/2022   CHOL 118 11/27/2021   Lab Results  Component Value Date   HDL 37.40 (L) 07/07/2023   HDL 42.20 06/18/2022   HDL 39 (L) 11/27/2021   Lab Results  Component Value Date   LDLCALC 60 07/07/2023   LDLCALC 63 06/18/2022   LDLCALC 58 11/27/2021   Lab Results  Component Value Date   TRIG 111.0 07/07/2023   TRIG 76.0 06/18/2022   TRIG 106 11/27/2021   Lab Results  Component  Value Date   CHOLHDL 3 07/07/2023   CHOLHDL 3 06/18/2022   CHOLHDL 3.0 11/27/2021   Lab Results  Component Value Date   HGBA1C 6.3 07/07/2023   HGBA1C 6.0 (A) 12/25/2022   HGBA1C 6.2 06/18/2022   Assessment & Plan:  Type 2 diabetes mellitus with other specified complication, without long-term current use of insulin (HCC) Assessment & Plan: Lab Results  Component Value Date   HGBA1C 6.3 07/07/2023   HGBA1C 6.0 (A) 12/25/2022   HGBA1C 6.2 06/18/2022  Stable with Metformin  500 mg daily, continue.  Recommend staying hydrated.  Check CMP today.   Orders: -     metFORMIN  HCl; Take 1 tablet (500 mg total) by mouth daily with breakfast.  Dispense: 90 tablet; Refill: 3 -     Comprehensive metabolic panel with GFR  Gout of foot, unspecified cause, unspecified chronicity, unspecified laterality Assessment & Plan: UA from 06/2023 within normal range. No flare up in last 10 years, continue Allopurinol  300 mg once daily.   Orders: -     Allopurinol ; Take 1 tablet (300 mg total) by mouth daily.  Dispense: 90 tablet; Refill: 3  Hyperlipidemia associated with type 2 diabetes mellitus (HCC) Assessment & Plan: Chronic, stable, continue atorvastatin  40  mg 9goal LDL <70, LDL from 06/2023 within goal.   Orders: -     Atorvastatin  Calcium ; Take 1 tablet (40 mg total) by mouth daily.  Dispense: 90 tablet; Refill: 3  Benign prostatic hyperplasia with weak urinary stream Assessment & Plan: Followed by [facility] urology and q6 monthly PSA check.     Stenosis of carotid artery, unspecified laterality Assessment & Plan: Continue f/u with [facility] vascular surgery, annual carotid duplex ultrasound. Continue risk factor management with Atorvastatin  40 mg daily, Aspirin 81 mg daily. Patient asymptomatic.    Controlled type 2 diabetes mellitus without complication, without long-term current use of insulin (HCC)  Idiopathic peripheral neuropathy Assessment & Plan: - Managed by [facility] neurology.   - Taking Lyrica 100 mg TID. If patient prefers to take 100 mg in AM and 200 mg in PM I recommend discussing this with neurology as well and be cautious with potential s/e.  - Discuss neuropathy management and potential new treatment with SaeboStim foot Bath with his neurology as I am not familiar or comfortable in prescribing this. I will look into this, FDA approval for neuropathy management with this and consider discussion with the patient during f/u visit.  -Consider melatonin 1 mg an hour before bedtime to aid sleep. Discussed referral to physical therapy for potential benefit in neuropathy management but patient declined.  - He has also noticed improvement in symptoms with pentoxifylline  400 mg three times a day which was previously started by his vascular surgeon. I did send refill on this  for 90 days. Follow up with vascular surgery for ongoing management and prescription refills.   Orders: -     Pentoxifylline  ER; Take 1 tablet (400 mg total) by mouth 3 (three) times daily.  Dispense: 270 tablet; Refill: 0 -     Melatonin; Take 1 tablet (1 mg total) by mouth at bedtime.  Dispense: 90 tablet; Refill: 1  Upper airway cough syndrome Assessment & Plan: Resolved, has used albuterol  inhaler in the past prn    Memory deficit Assessment & Plan: Recommend regular f/u with neurology at [facility]. Needs refill on Memantine  10 mg BID, future refill through neurology   Orders: -     Memantine  HCl; Take 1 tablet (10 mg total) by mouth 2 (two) times daily. For memory  Dispense: 180 tablet; Refill: 0  Other orders -     Triamcinolone  Acetonide; Apply topically as needed.  Dispense: 30 g; Refill: 0   I personally spent a total of 60 minutes in the care of the patient today including preparing to see the patient, performing a medically appropriate exam/evaluation, counseling and educating, placing orders, documenting clinical information in the EHR, independently interpreting results, and  communicating results.   Return in about 4 months (around 12/28/2023) for Annual physical .   Richard Koskela, MD

## 2023-08-28 NOTE — Assessment & Plan Note (Addendum)
 Continue f/u with [facility] vascular surgery, annual carotid duplex ultrasound. Continue risk factor management with Atorvastatin  40 mg daily, Aspirin 81 mg daily. Patient asymptomatic.

## 2023-08-28 NOTE — Assessment & Plan Note (Signed)
 Chronic, stable, continue atorvastatin  40 mg 9goal LDL <70, LDL from 06/2023 within goal.

## 2023-08-28 NOTE — Assessment & Plan Note (Signed)
 Followed by [facility] urology and q6 monthly PSA check.

## 2023-08-28 NOTE — Assessment & Plan Note (Signed)
 Resolved, has used albuterol  inhaler in the past prn

## 2023-08-28 NOTE — Assessment & Plan Note (Signed)
 UA from 06/2023 within normal range. No flare up in last 10 years, continue Allopurinol  300 mg once daily.

## 2023-08-29 ENCOUNTER — Ambulatory Visit: Payer: Self-pay

## 2023-08-29 LAB — COMPREHENSIVE METABOLIC PANEL WITH GFR
ALT: 24 U/L (ref 0–53)
AST: 22 U/L (ref 0–37)
Albumin: 4.5 g/dL (ref 3.5–5.2)
Alkaline Phosphatase: 84 U/L (ref 39–117)
BUN: 16 mg/dL (ref 6–23)
CO2: 28 meq/L (ref 19–32)
Calcium: 9.7 mg/dL (ref 8.4–10.5)
Chloride: 103 meq/L (ref 96–112)
Creatinine, Ser: 1.14 mg/dL (ref 0.40–1.50)
GFR: 65.77 mL/min (ref >60.00)
Glucose, Bld: 89 mg/dL (ref 70–99)
Potassium: 4.5 meq/L (ref 3.5–5.1)
Sodium: 141 meq/L (ref 135–145)
Total Bilirubin: 1.1 mg/dL (ref 0.2–1.2)
Total Protein: 7.3 g/dL (ref 6.0–8.3)

## 2023-10-07 ENCOUNTER — Telehealth: Admitting: Physician Assistant

## 2023-10-07 ENCOUNTER — Telehealth: Payer: Self-pay

## 2023-10-07 DIAGNOSIS — U071 COVID-19: Secondary | ICD-10-CM

## 2023-10-07 MED ORDER — BENZONATATE 100 MG PO CAPS: 100.0000 mg | ORAL_CAPSULE | Freq: Three times a day (TID) | ORAL | 0 refills | Status: AC | PRN

## 2023-10-07 MED ORDER — NIRMATRELVIR/RITONAVIR (PAXLOVID)TABLET: 3.0000 | ORAL_TABLET | Freq: Two times a day (BID) | ORAL | 0 refills | Status: AC

## 2023-10-07 NOTE — Telephone Encounter (Signed)
 Patient wife notified that due to unavailability here in the office, and since they are MyChart active they can be seen via telehealth providers. Wife verbalized understanding and has no further questions.

## 2023-10-07 NOTE — Patient Instructions (Signed)
 Richard Case, thank you for joining [name], PA-C for today's virtual visit.  While this provider is not your primary care provider (PCP), if your PCP is located in our provider database this encounter information will be shared with them immediately following your visit.   A [facility] MyChart account gives you access to today's visit and all your visits, tests, and labs performed at [facility]  click here if you don't have a [facility] MyChart account or go to [email or web address]  Consent: (Patient) Richard Case provided verbal consent for this virtual visit at the beginning of the encounter.  Current Medications:  Current Outpatient Medications:    benzonatate  (TESSALON ) 100 MG capsule, Take 1 capsule (100 mg total) by mouth 3 (three) times daily as needed., Disp: 30 capsule, Rfl: 0   nirmatrelvir /ritonavir  (PAXLOVID ) 20 x 150 MG & 10 x 100MG  TABS, Take 3 tablets by mouth 2 (two) times daily for 5 days. (Take nirmatrelvir  150 mg two tablets twice daily for 5 days and ritonavir  100 mg one tablet twice daily for 5 days) Patient GFR is 65.7, Disp: 30 tablet, Rfl: 0   albuterol  (VENTOLIN  HFA) 108 (90 Base) MCG/ACT inhaler, TAKE 2 PUFFS BY MOUTH EVERY 6 HOURS AS NEEDED FOR WHEEZE OR SHORTNESS OF BREATH, Disp: 18 each, Rfl: 1   allopurinol  (ZYLOPRIM ) 300 MG tablet, Take 1 tablet (300 mg total) by mouth daily., Disp: 90 tablet, Rfl: 3   ALPHA LIPOIC ACID PO, Take 1 tablet by mouth 3 (three) times daily., Disp: , Rfl:    aspirin 81 MG chewable tablet, Chew 1 tablet (81 mg total) by mouth daily., Disp: , Rfl:    atorvastatin  (LIPITOR) 40 MG tablet, Take 1 tablet (40 mg total) by mouth daily., Disp: 90 tablet, Rfl: 3   cetirizine (ZYRTEC) 10 MG tablet, Take 10 mg by mouth daily., Disp: , Rfl:    fluticasone (FLONASE) 50 MCG/ACT nasal spray, Place 2 sprays into both nostrils daily as needed for allergies or rhinitis., Disp: , Rfl:    melatonin 1 MG TABS tablet, Take  1 tablet (1 mg total) by mouth at bedtime., Disp: 90 tablet, Rfl: 1   memantine  (NAMENDA ) 10 MG tablet, Take 1 tablet (10 mg total) by mouth 2 (two) times daily. For memory, Disp: 180 tablet, Rfl: 0   metFORMIN  (GLUCOPHAGE ) 500 MG tablet, Take 1 tablet (500 mg total) by mouth daily with breakfast., Disp: 90 tablet, Rfl: 3   pentoxifylline  (TRENTAL ) 400 MG CR tablet, Take 1 tablet (400 mg total) by mouth 3 (three) times daily., Disp: 270 tablet, Rfl: 0   pregabalin (LYRICA) 100 MG capsule, PLEASE SEE ATTACHED FOR DETAILED DIRECTIONS, Disp: , Rfl:    tadalafil  (CIALIS ) 5 MG tablet, Take 5 mg by mouth daily as needed for erectile dysfunction., Disp: , Rfl:    tamsulosin  (FLOMAX ) 0.4 MG CAPS capsule, TAKE 1 CAPSULE BY MOUTH EVERY DAY, Disp: 90 capsule, Rfl: 3   thiamine  (VITAMIN B1) 100 MG tablet, Take 1 tablet (100 mg total) by mouth once a week., Disp: , Rfl:    triamcinolone  cream (KENALOG ) 0.1 %, Apply topically as needed., Disp: 30 g, Rfl: 0   Medications ordered in this encounter:  Meds ordered this encounter  Medications   nirmatrelvir /ritonavir  (PAXLOVID ) 20 x 150 MG & 10 x 100MG  TABS    Sig: Take 3 tablets by mouth 2 (two) times daily for 5 days. (Take nirmatrelvir  150 mg two tablets twice daily for 5 days and ritonavir  100  mg one tablet twice daily for 5 days) Patient GFR is 65.7    Dispense:  30 tablet    Refill:  0    Supervising Provider:   BLAISE ALEENE KIDD [[id number]]   benzonatate  (TESSALON ) 100 MG capsule    Sig: Take 1 capsule (100 mg total) by mouth 3 (three) times daily as needed.    Dispense:  30 capsule    Refill:  0    Supervising Provider:   BLAISE ALEENE KIDD [[id number]]     *If you need refills on other medications prior to your next appointment, please contact your pharmacy*  Follow-Up: Call back or seek an in-person evaluation if the symptoms worsen or if the condition fails to improve as anticipated.  [facility] [phone]  Care Instructions: Can  take to lessen severity (if able): Vit C 500mg  twice daily Quercertin 250-500mg  twice daily Zinc 75-100mg  daily Melatonin 3-6 mg at bedtime Vit D3 1000-2000 IU daily Aspirin 81 mg daily with food Optional: Famotidine 20mg  daily Also can add tylenol/ibuprofen as needed for fevers and body aches May add Mucinex or Mucinex DM as needed for cough/congestion    Isolation Instructions: You are to isolate at home until you have been fever free for at least 24 hours without a fever-reducing medication, and symptoms have been steadily improving for 24 hours. At that time,  you can end isolation but need to mask for an additional 5 days.   If you must be around other household members who do not have symptoms, you need to make sure that both you and the family members are masking consistently with a high-quality mask.  If you note any worsening of symptoms despite treatment, please seek an in-person evaluation ASAP. If you note any significant shortness of breath or any chest pain, please seek ER evaluation. Please do not delay care!   COVID-19: What to Do if You Are Sick If you test positive and are an older adult or someone who is at high risk of getting very sick from COVID-19, treatment may be available. Contact a healthcare provider right away after a positive test to determine if you are eligible, even if your symptoms are mild right now. You can also visit a Test to Treat location and, if eligible, receive a prescription from a provider. Don't delay: Treatment must be started within the first few days to be effective. If you have a fever, cough, or other symptoms, you might have COVID-19. Most people have mild illness and are able to recover at home. If you are sick: Keep track of your symptoms. If you have an emergency warning sign (including trouble breathing), call 911. Steps to help prevent the spread of COVID-19 if you are sick If you are sick with COVID-19 or think you might have  COVID-19, follow the steps below to care for yourself and to help protect other people in your home and community. Stay home except to get medical care Stay home. Most people with COVID-19 have mild illness and can recover at home without medical care. Do not leave your home, except to get medical care. Do not visit public areas and do not go to places where you are unable to wear a mask. Take care of yourself. Get rest and stay hydrated. Take over-the-counter medicines, such as acetaminophen, to help you feel better. Stay in touch with your doctor. Call before you get medical care. Be sure to get care if you have trouble breathing, or have  any other emergency warning signs, or if you think it is an emergency. Avoid public transportation, ride-sharing, or taxis if possible. Get tested If you have symptoms of COVID-19, get tested. While waiting for test results, stay away from others, including staying apart from those living in your household. Get tested as soon as possible after your symptoms start. Treatments may be available for people with COVID-19 who are at risk for becoming very sick. Don't delay: Treatment must be started early to be effective--some treatments must begin within 5 days of your first symptoms. Contact your healthcare provider right away if your test result is positive to determine if you are eligible. Self-tests are one of several options for testing for the virus that causes COVID-19 and may be more convenient than laboratory-based tests and point-of-care tests. Ask your healthcare provider or your local health department if you need help interpreting your test results. You can visit your state, tribal, local, and territorial health department's website to look for the latest local information on testing sites. Separate yourself from other people As much as possible, stay in a specific room and away from other people and pets in your home. If possible, you should use a separate  bathroom. If you need to be around other people or animals in or outside of the home, wear a well-fitting mask. Tell your close contacts that they may have been exposed to COVID-19. An infected person can spread COVID-19 starting 48 hours (or 2 days) before the person has any symptoms or tests positive. By letting your close contacts know they may have been exposed to COVID-19, you are helping to protect everyone. See COVID-19 and Animals if you have questions about pets. If you are diagnosed with COVID-19, someone from the health department may call you. Answer the call to slow the spread. Monitor your symptoms Symptoms of COVID-19 include fever, cough, or other symptoms. Follow care instructions from your healthcare provider and local health department. Your local health authorities may give instructions on checking your symptoms and reporting information. When to seek emergency medical attention Look for emergency warning signs* for COVID-19. If someone is showing any of these signs, seek emergency medical care immediately: Trouble breathing Persistent pain or pressure in the chest New confusion Inability to wake or stay awake Pale, gray, or blue-colored skin, lips, or nail beds, depending on skin tone *This list is not all possible symptoms. Please call your medical provider for any other symptoms that are severe or concerning to you. Call 911 or call ahead to your local emergency facility: Notify the operator that you are seeking care for someone who has or may have COVID-19. Call ahead before visiting your doctor Call ahead. Many medical visits for routine care are being postponed or done by phone or telemedicine. If you have a medical appointment that cannot be postponed, call your doctor's office, and tell them you have or may have COVID-19. This will help the office protect themselves and other patients. If you are sick, wear a well-fitting mask You should wear a mask if you must be  around other people or animals, including pets (even at home). Wear a mask with the best fit, protection, and comfort for you. You don't need to wear the mask if you are alone. If you can't put on a mask (because of trouble breathing, for example), cover your coughs and sneezes in some other way. Try to stay at least 6 feet away from other people. This will help protect the  people around you. Masks should not be placed on young children under age 64 years, anyone who has trouble breathing, or anyone who is not able to remove the mask without help. Cover your coughs and sneezes Cover your mouth and nose with a tissue when you cough or sneeze. Throw away used tissues in a lined trash can. Immediately wash your hands with soap and water for at least 20 seconds. If soap and water are not available, clean your hands with an alcohol-based hand sanitizer that contains at least 60% alcohol. Clean your hands often Wash your hands often with soap and water for at least 20 seconds. This is especially important after blowing your nose, coughing, or sneezing; going to the bathroom; and before eating or preparing food. Use hand sanitizer if soap and water are not available. Use an alcohol-based hand sanitizer with at least 60% alcohol, covering all surfaces of your hands and rubbing them together until they feel dry. Soap and water are the best option, especially if hands are visibly dirty. Avoid touching your eyes, nose, and mouth with unwashed hands. Handwashing Tips Avoid sharing personal household items Do not share dishes, drinking glasses, cups, eating utensils, towels, or bedding with other people in your home. Wash these items thoroughly after using them with soap and water or put in the dishwasher. Clean surfaces in your home regularly Clean and disinfect high-touch surfaces (for example, doorknobs, tables, handles, light switches, and countertops) in your sick room and bathroom. In shared spaces, you  should clean and disinfect surfaces and items after each use by the person who is ill. If you are sick and cannot clean, a caregiver or other person should only clean and disinfect the area around you (such as your bedroom and bathroom) on an as needed basis. Your caregiver/other person should wait as long as possible (at least several hours) and wear a mask before entering, cleaning, and disinfecting shared spaces that you use. Clean and disinfect areas that may have blood, stool, or body fluids on them. Use household cleaners and disinfectants. Clean visible dirty surfaces with household cleaners containing soap or detergent. Then, use a household disinfectant. Use a product from [employer] List N: Disinfectants for Coronavirus (COVID-19). Be sure to follow the instructions on the label to ensure safe and effective use of the product. Many products recommend keeping the surface wet with a disinfectant for a certain period of time (look at contact time on the product label). You may also need to wear personal protective equipment, such as gloves, depending on the directions on the product label. Immediately after disinfecting, wash your hands with soap and water for 20 seconds. For completed guidance on cleaning and disinfecting your home, visit Complete Disinfection Guidance. Take steps to improve ventilation at home Improve ventilation (air flow) at home to help prevent from spreading COVID-19 to other people in your household. Clear out COVID-19 virus particles in the air by opening windows, using air filters, and turning on fans in your home. Use this interactive tool to learn how to improve air flow in your home. When you can be around others after being sick with COVID-19 Deciding when you can be around others is different for different situations. Find out when you can safely end home isolation. For any additional questions about your care, contact your healthcare provider or state or local  health department. 04/04/2020 Content source: [facility] for Immunization and Respiratory Diseases ([facility]), Division of Viral Diseases This information is not intended to  replace advice given to you by your health care provider. Make sure you discuss any questions you have with your health care provider. Document Revised: 05/18/2020 Document Reviewed: 05/18/2020 Elsevier Patient Education  2022 [employer].     If you have been instructed to have an in-person evaluation today at a local Urgent Care facility, please use the link below. It will take you to a list of all of our available [facility], including address, phone number and hours of operation. Please do not delay care.  [facility]  If you or a family member do not have a primary care provider, use the link below to schedule a visit and establish care. When you choose a [facility] primary care physician or advanced practice provider, you gain a long-term partner in health. Find a Primary Care Provider  Learn more about [facility] in-office and virtual care options: [facility] - Get Care Now

## 2023-10-07 NOTE — Progress Notes (Signed)
 Virtual Visit Consent   Gar Glance, you are scheduled for a virtual visit with a [facility] provider today. Just as with appointments in the office, your consent must be obtained to participate. Your consent will be active for this visit and any virtual visit you may have with one of our providers in the next 365 days. If you have a MyChart account, a copy of this consent can be sent to you electronically.  As this is a virtual visit, video technology does not allow for your provider to perform a traditional examination. This may limit your provider's ability to fully assess your condition. If your provider identifies any concerns that need to be evaluated in person or the need to arrange testing (such as labs, EKG, etc.), we will make arrangements to do so. Although advances in technology are sophisticated, we cannot ensure that it will always work on either your end or our end. If the connection with a video visit is poor, the visit may have to be switched to a telephone visit. With either a video or telephone visit, we are not always able to ensure that we have a secure connection.  By engaging in this virtual visit, you consent to the provision of healthcare and authorize for your insurance to be billed (if applicable) for the services provided during this visit. Depending on your insurance coverage, you may receive a charge related to this service.  I need to obtain your verbal consent now. Are you willing to proceed with your visit today? Richard Case has provided verbal consent on 10/07/2023 for a virtual visit (video or telephone). Richard CHRISTELLA Dickinson, PA-C  Date: 10/07/2023 3:38 PM   Virtual Visit via Video Note   I, Richard Case, connected with  Richard Case  ([id number], 1955/04/10) on 10/07/23 at  2:15 PM EDT by a video-enabled telemedicine application and verified that I am speaking with the correct person using two identifiers.  Location: Patient: Virtual Visit Location  Patient: Home Provider: Virtual Visit Location Provider: Home Office   I discussed the limitations of evaluation and management by telemedicine and the availability of in person appointments. The patient expressed understanding and agreed to proceed.    History of Present Illness: Richard Case is a 68 y.o. who identifies as a male who was assigned male at birth, and is being seen today for Covid19.  HPI: URI  This is a new problem. Episode onset: Covid positive on at home test today; Symptoms started Friday, 10/03/23. The problem has been gradually worsening. Maximum temperature: subjective fever yesterday. The fever has been present for Less than 1 day. Associated symptoms include congestion, coughing (productive), diarrhea, headaches, a plugged ear sensation, rhinorrhea (and post nasal drainage), a sore throat (only with cough) and wheezing (mild). Pertinent negatives include no chest pain, ear pain, nausea, sinus pain or vomiting. Associated symptoms comments: Loss of taste. He has tried antihistamine (Zyrtec daily, Zycam) for the symptoms. The treatment provided no relief.     Problems:  Patient Active Problem List   Diagnosis Date Noted   Chronic pain syndrome 08/28/2023   Medication management 08/28/2023   History of colonic polyps 02/13/2023   Adenomatous polyp of colon 02/13/2023   ADHD 12/28/2022   Peripheral neuropathy 02/19/2022   Upper airway cough syndrome 09/28/2021   Carotid stenosis 06/12/2021   Advanced directives, counseling/discussion 06/06/2021   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 04/17/2021   Memory deficit 04/16/2021   Medicare annual wellness visit, subsequent  11/24/2019   Type 2 diabetes mellitus with other specified complication (HCC) 12/29/2018   Sensorineural hearing loss, bilateral 12/12/2018   Tinnitus 10/29/2017   Radon exposure 04/04/2016   Complex renal cyst 06/03/2015   History of kidney stones 04/04/2015   Peyronie disease 04/04/2015    BPH (benign prostatic hyperplasia) 03/28/2015   Hyperlipidemia associated with type 2 diabetes mellitus (HCC)    Erectile dysfunction    OSA (obstructive sleep apnea)    Obesity, Class I, BMI 30-34.9    Seasonal allergic rhinitis    Gout     Allergies:  Allergies  Allergen Reactions   Dog Epithelium (Canis Lupus Familiaris) Dermatitis   Strattera [Atomoxetine Hcl] Other (See Comments)    Urinary retention   Medications:  Current Outpatient Medications:    benzonatate  (TESSALON ) 100 MG capsule, Take 1 capsule (100 mg total) by mouth 3 (three) times daily as needed., Disp: 30 capsule, Rfl: 0   nirmatrelvir /ritonavir  (PAXLOVID ) 20 x 150 MG & 10 x 100MG  TABS, Take 3 tablets by mouth 2 (two) times daily for 5 days. (Take nirmatrelvir  150 mg two tablets twice daily for 5 days and ritonavir  100 mg one tablet twice daily for 5 days) Patient GFR is 65.7, Disp: 30 tablet, Rfl: 0   albuterol  (VENTOLIN  HFA) 108 (90 Base) MCG/ACT inhaler, TAKE 2 PUFFS BY MOUTH EVERY 6 HOURS AS NEEDED FOR WHEEZE OR SHORTNESS OF BREATH, Disp: 18 each, Rfl: 1   allopurinol  (ZYLOPRIM ) 300 MG tablet, Take 1 tablet (300 mg total) by mouth daily., Disp: 90 tablet, Rfl: 3   ALPHA LIPOIC ACID PO, Take 1 tablet by mouth 3 (three) times daily., Disp: , Rfl:    aspirin 81 MG chewable tablet, Chew 1 tablet (81 mg total) by mouth daily., Disp: , Rfl:    atorvastatin  (LIPITOR) 40 MG tablet, Take 1 tablet (40 mg total) by mouth daily., Disp: 90 tablet, Rfl: 3   cetirizine (ZYRTEC) 10 MG tablet, Take 10 mg by mouth daily., Disp: , Rfl:    fluticasone (FLONASE) 50 MCG/ACT nasal spray, Place 2 sprays into both nostrils daily as needed for allergies or rhinitis., Disp: , Rfl:    melatonin 1 MG TABS tablet, Take 1 tablet (1 mg total) by mouth at bedtime., Disp: 90 tablet, Rfl: 1   memantine  (NAMENDA ) 10 MG tablet, Take 1 tablet (10 mg total) by mouth 2 (two) times daily. For memory, Disp: 180 tablet, Rfl: 0   metFORMIN  (GLUCOPHAGE ) 500 MG  tablet, Take 1 tablet (500 mg total) by mouth daily with breakfast., Disp: 90 tablet, Rfl: 3   pentoxifylline  (TRENTAL ) 400 MG CR tablet, Take 1 tablet (400 mg total) by mouth 3 (three) times daily., Disp: 270 tablet, Rfl: 0   pregabalin (LYRICA) 100 MG capsule, PLEASE SEE ATTACHED FOR DETAILED DIRECTIONS, Disp: , Rfl:    tadalafil  (CIALIS ) 5 MG tablet, Take 5 mg by mouth daily as needed for erectile dysfunction., Disp: , Rfl:    tamsulosin  (FLOMAX ) 0.4 MG CAPS capsule, TAKE 1 CAPSULE BY MOUTH EVERY DAY, Disp: 90 capsule, Rfl: 3   thiamine  (VITAMIN B1) 100 MG tablet, Take 1 tablet (100 mg total) by mouth once a week., Disp: , Rfl:    triamcinolone  cream (KENALOG ) 0.1 %, Apply topically as needed., Disp: 30 g, Rfl: 0  Observations/Objective: Patient is well-developed, well-nourished in no acute distress.  Resting comfortably at home.  Head is normocephalic, atraumatic.  No labored breathing.  Speech is clear and coherent with logical content.  Patient  is alert and oriented at baseline.    Assessment and Plan: 1. COVID-19 (Primary) - nirmatrelvir /ritonavir  (PAXLOVID ) 20 x 150 MG & 10 x 100MG  TABS; Take 3 tablets by mouth 2 (two) times daily for 5 days. (Take nirmatrelvir  150 mg two tablets twice daily for 5 days and ritonavir  100 mg one tablet twice daily for 5 days) Patient GFR is 65.7  Dispense: 30 tablet; Refill: 0 - benzonatate  (TESSALON ) 100 MG capsule; Take 1 capsule (100 mg total) by mouth 3 (three) times daily as needed.  Dispense: 30 capsule; Refill: 0  - Continue OTC symptomatic management of choice - Will send OTC vitamins and supplement information through AVS - Paxlovid  prescribed, HOLD atorvastatin  - Tessalon  for cough - Patient enrolled in MyChart symptom monitoring - Push fluids - Rest as needed - Discussed return precautions and when to seek in-person evaluation, sent via AVS as well   Follow Up Instructions: I discussed the assessment and treatment plan with the  patient. The patient was provided an opportunity to ask questions and all were answered. The patient agreed with the plan and demonstrated an understanding of the instructions.  A copy of instructions were sent to the patient via MyChart unless otherwise noted below.    The patient was advised to call back or seek an in-person evaluation if the symptoms worsen or if the condition fails to improve as anticipated.    Richard CHRISTELLA Dickinson, PA-C

## 2023-10-07 NOTE — Telephone Encounter (Signed)
 Copied from CRM [id number]. Topic: Clinical - Medication Question >> Oct 07, 2023  8:49 AM Pinkey ORN wrote: Reason for CRM: Medication Request >> Oct 07, 2023  8:51 AM Pinkey ORN wrote: Patient is requesting some medication as he has tested positive for COVID last Friday. Please follow up with patient.   [facility] - [address] , [place]

## 2023-11-18 NOTE — Progress Notes (Signed)
 2:28 PM   Richard Case 01/01/1956 [id number]  Referring provider: Rilla Baller, MD [address] [place],  [place] [address]  Urological history: 1. Nephrolithiasis -stone composition of 93% calcium  oxalate monohydrate, 5% calcium  oxalate dihydrate and 2% calcium  phosphate carbonate -no stone appreciated on RUS 09/2021  2. Complex renal cyst -RUS 09/2021- bilateral cysts   3. BPH with LU TS -PSA (06/2023) 1.81 -prostate volume 70 cc  -tamsulosin  0.4 mg daily  4. Family history of prostate cancer -father with non-fatal prostate cancer  Chief Complaint  Patient presents with   Benign Prostatic Hypertrophy    HPI: Richard Case is a 68 y.o. male who presents today for follow up.   Previous records reviewed.   I PSS 9/3  He reports sensation of incomplete bladder emptying, urinary frequency, urinary urgency, having to strain to void, and nocturia x 1.  He has been noticing more urinary urgency, and frequency over the last few months.  He also has had episodes where he almost had urge incontinence.  And he has an occasional spraying of the urinary stream.  Patient denies any modifying or aggravating factors.  Patient denies any recent UTI's, gross hematuria, dysuria or suprapubic/flank pain.  Patient denies any fevers, chills, nausea or vomiting.    He still does not know the status of his brothers elevated PSA as they have not heard whether or not he had a biopsy or any further follow-ups or treatments.  UA yellow clear, specific gravity 1.010, pH 6.5, 0-5 WBC's, 0-2 RBC's, and 0-10 epithelial cells.    PVR 127 mL   PSA (06/2023) 1.81  Serum creatinine (08/2023) 1.14, eGFR 65.77  Hemoglobin A1c (06/2023) 6.3  BPH meds: Tamsulosin  0.4 mg daily  PMH: Past Medical History:  Diagnosis Date   ADD (attention deficit disorder) 2011   no records of workup; strattera caused urinary retention, not interested in habit forming medication   Allergy Prior  to 2016   Dogs and cats, pollen   Atrial ectopy 2011   improved with CPAP, documented by holter   Benign colon polyp ?2014   hyperplastic   BPH (benign prostatic hypertrophy)    per prior PCP records   Chronic cough 06/28/2020   Diabetes mellitus without complication (HCC)    Elevated blood pressure (not hypertension)    Erectile dysfunction    GERD (gastroesophageal reflux disease)    Gout    Heart murmur longstanding   History of kidney stones 2008, 2017   History of pneumonia 2009   HLD (hyperlipidemia)    Kidney stones    HX   Male erectile dysfunction    Neuromuscular disorder (HCC)    Obesity, Class I, BMI 30-34.9    OSA (obstructive sleep apnea) 2010   OSA with stabilization at CPAP 7cm   Peyronie disease    per prior pcp records   Polycythemia    ?OSA related   Prediabetes    Rosacea    per prior pcp records   Seasonal allergic rhinitis    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    Surgical History: Past Surgical History:  Procedure Laterality Date   BASAL CELL CARCINOMA EXCISION Right    right ear, 08/2022   CARDIOVASCULAR STRESS TEST  07/2017   low risk stress test, hypertensive response (End)   COLON SURGERY  Jan 2025   Polyps removed   COLONOSCOPY  2009   rec rpt 5 yrs per prior  PCP records but no actual report   COLONOSCOPY  03/2012   mild diverticulosis, rpt 5 yrs (Dr Arron in [place])   COLONOSCOPY  01/2023   TA,HP, diverticulosis rpt 5 yrs Romero)   COLONOSCOPY WITH PROPOFOL  N/A 12/18/2017   TAx2, HP, diverticulosis, rpt 5 yrs Romero, Ruel, MD)   COLONOSCOPY WITH PROPOFOL  N/A 02/13/2023   Procedure: COLONOSCOPY WITH PROPOFOL ;  Surgeon: Therisa Ruel, MD;  Location: [facility] ENDOSCOPY;  Service: Gastroenterology;  Laterality: N/A;   COSMETIC SURGERY  01/2021  Scalp repair.  Needs more in future.   EXTRACORPOREAL SHOCK WAVE LITHOTRIPSY Right 04/06/2015   Procedure: EXTRACORPOREAL SHOCK WAVE LITHOTRIPSY (ESWL);  Surgeon: Redell Lynwood Napoleon, MD;  Location: [facility] ORS;  Service: Urology;  Laterality: Right;   head surgery     01/2021, squamos cell carcinoma   MOHS SURGERY  2015   SCC of nose   POLYPECTOMY  02/13/2023   Procedure: POLYPECTOMY;  Surgeon: Therisa Ruel, MD;  Location: [facility] ENDOSCOPY;  Service: Gastroenterology;;   TONSILLECTOMY      Home Medications:  Allergies as of 11/19/2023       Reactions   Dog Epithelium (canis Lupus Familiaris) Dermatitis   Strattera [atomoxetine Hcl] Other (See Comments)   Urinary retention        Medication List        Accurate as of November 19, 2023 11:59 PM. If you have any questions, ask your nurse or doctor.          albuterol  108 (90 Base) MCG/ACT inhaler Commonly known as: VENTOLIN  HFA TAKE 2 PUFFS BY MOUTH EVERY 6 HOURS AS NEEDED FOR WHEEZE OR SHORTNESS OF BREATH   allopurinol  300 MG tablet Commonly known as: ZYLOPRIM  Take 1 tablet (300 mg total) by mouth daily.   ALPHA LIPOIC ACID PO Take 1 tablet by mouth 3 (three) times daily.   aspirin 81 MG chewable tablet Chew 1 tablet (81 mg total) by mouth daily.   atorvastatin  40 MG tablet Commonly known as: LIPITOR Take 1 tablet (40 mg total) by mouth daily.   benzonatate  100 MG capsule Commonly known as: TESSALON  Take 1 capsule (100 mg total) by mouth 3 (three) times daily as needed.   cetirizine 10 MG tablet Commonly known as: ZYRTEC Take 10 mg by mouth daily.   D 1000 25 MCG (1000 UT) capsule Generic drug: Cholecalciferol Take 1,000 Units by mouth.   fluticasone 50 MCG/ACT nasal spray Commonly known as: FLONASE Place 2 sprays into both nostrils daily as needed for allergies or rhinitis.   melatonin 1 MG Tabs tablet Take 1 tablet (1 mg total) by mouth at bedtime.   memantine  10 MG tablet Commonly known as: NAMENDA  Take 1 tablet (10 mg total) by mouth 2 (two) times daily. For memory   metFORMIN  500 MG tablet Commonly known as: GLUCOPHAGE  Take 1 tablet (500 mg total) by mouth daily with  breakfast.   pentoxifylline  400 MG CR tablet Commonly known as: TRENTAL  Take 1 tablet (400 mg total) by mouth 3 (three) times daily.   pregabalin 100 MG capsule Commonly known as: LYRICA PLEASE SEE ATTACHED FOR DETAILED DIRECTIONS   tadalafil  5 MG tablet Commonly known as: CIALIS  Take 5 mg by mouth daily as needed for erectile dysfunction.   tamsulosin  0.4 MG Caps capsule Commonly known as: FLOMAX  TAKE 1 CAPSULE BY MOUTH EVERY DAY   thiamine  100 MG tablet Commonly known as: VITAMIN B1 Take 1 tablet (100 mg total) by mouth once a week.   triamcinolone   cream 0.1 % Commonly known as: KENALOG  Apply topically as needed.        Allergies:  Allergies  Allergen Reactions   Dog Epithelium (Canis Lupus Familiaris) Dermatitis   Strattera [Atomoxetine Hcl] Other (See Comments)    Urinary retention    Family History: Family History  Problem Relation Age of Onset   Cancer Father 24       prostate   CAD Father        possibly?   Prostate cancer Father    Heart Problems Father    Hearing loss Father    Cancer Mother        lung   Lung disease Mother    Cancer Brother    Obesity Brother    Stroke Neg Hx    Diabetes Neg Hx    Kidney disease Neg Hx    Kidney cancer Neg Hx    Bladder Cancer Neg Hx     Social History:  reports that he has never smoked. He has never used smokeless tobacco. He reports current alcohol use. He reports that he does not use drugs.  ROS: For pertinent review of systems please refer to history of present illness  Physical Exam: BP (!) 153/84   Pulse 98   Ht 5' 10.5 (1.791 m)   Wt 215 lb (97.5 kg)   BMI 30.41 kg/m   Constitutional:  Well nourished. Alert and oriented, No acute distress. HEENT: [place] AT, moist mucus membranes.  Trachea midline Cardiovascular: No clubbing, cyanosis, or edema. Respiratory: Normal respiratory effort, no increased work of breathing. Neurologic: Grossly intact, no focal deficits, moving all 4  extremities. Psychiatric: Normal mood and affect.   Laboratory Data: See Epic and HPI   I have reviewed the labs.  See HPI.     Pertinent Imaging:  11/19/23 14:39  Scan Result    Assessment & Plan:    1. BPH with LU TS - moderate and he is mixed about his urinary symptoms  - PSA pending  - UA benign  - PVR < 300 cc  - most bothersome symptoms are frequency, urgency and splitting of the urinary stream  - encouraged avoiding bladder irritants, fluid restriction before bedtime and timed voiding's - Continue tamsulosin  0.4 mg daily  - We discussed further evaluation of his symptoms with cystoscopy, but he states that they are not that bothersome, but he will consider it if his symptoms worsen   2. ED -Continue tadalafil  5 mg but on a as needed basis   3. Family history of prostate cancer -Brother was recently found to have elevated PSA and undergoing prostate biopsy in the near future, still no word on the outcome - Mr. Grandfield would like to continue monitoring his PSA every 6 months until they have more history on his brother  Return in about 6 months (around 05/18/2024) for IPSS, PVR and PSA.  These notes generated with voice recognition software. I apologize for typographical errors.  CLOTILDA HELON RIGGERS  [facility] [address] [place], [place] [address] [phone]

## 2023-11-19 ENCOUNTER — Ambulatory Visit (INDEPENDENT_AMBULATORY_CARE_PROVIDER_SITE_OTHER): Payer: Medicare Other | Admitting: Urology

## 2023-11-19 ENCOUNTER — Encounter: Payer: Self-pay | Admitting: Urology

## 2023-11-19 DIAGNOSIS — R3912 Poor urinary stream: Secondary | ICD-10-CM | POA: Diagnosis not present

## 2023-11-19 DIAGNOSIS — Z8042 Family history of malignant neoplasm of prostate: Secondary | ICD-10-CM | POA: Diagnosis not present

## 2023-11-19 DIAGNOSIS — N529 Male erectile dysfunction, unspecified: Secondary | ICD-10-CM | POA: Diagnosis not present

## 2023-11-19 DIAGNOSIS — N401 Enlarged prostate with lower urinary tract symptoms: Secondary | ICD-10-CM

## 2023-11-19 LAB — MICROSCOPIC EXAMINATION

## 2023-11-19 LAB — BLADDER SCAN AMB NON-IMAGING

## 2023-11-20 ENCOUNTER — Ambulatory Visit: Payer: Self-pay | Admitting: Urology

## 2023-11-20 LAB — MICROSCOPIC EXAMINATION: Renal Epithel, UA: NONE SEEN

## 2023-11-20 LAB — URINALYSIS, COMPLETE
Bilirubin, UA: NEGATIVE
Glucose, UA: NEGATIVE
Ketones, UA: NEGATIVE
Leukocytes,UA: NEGATIVE
Nitrite, UA: NEGATIVE
Protein,UA: NEGATIVE
RBC, UA: NEGATIVE
Specific Gravity, UA: 1.01 (ref 1.005–1.030)
Urobilinogen, Ur: 0.2 mg/dL (ref 0.2–1.0)
pH, UA: 6.5 (ref 5.0–7.5)

## 2023-11-20 LAB — PSA: Prostate Specific Ag, Serum: 1.6 ng/mL (ref 0.0–4.0)

## 2023-11-24 ENCOUNTER — Other Ambulatory Visit: Payer: Self-pay

## 2023-11-24 DIAGNOSIS — R413 Other amnesia: Secondary | ICD-10-CM

## 2023-11-24 DIAGNOSIS — G609 Hereditary and idiopathic neuropathy, unspecified: Secondary | ICD-10-CM

## 2023-12-30 ENCOUNTER — Ambulatory Visit

## 2023-12-30 VITALS — BP 110/60 | HR 96 | Temp 98.4°F | Ht 70.0 in | Wt 222.6 lb

## 2023-12-30 DIAGNOSIS — Z79899 Other long term (current) drug therapy: Secondary | ICD-10-CM

## 2023-12-30 DIAGNOSIS — E1169 Type 2 diabetes mellitus with other specified complication: Secondary | ICD-10-CM

## 2023-12-30 DIAGNOSIS — R053 Chronic cough: Secondary | ICD-10-CM

## 2023-12-30 DIAGNOSIS — Z7984 Long term (current) use of oral hypoglycemic drugs: Secondary | ICD-10-CM

## 2023-12-30 DIAGNOSIS — E538 Deficiency of other specified B group vitamins: Secondary | ICD-10-CM

## 2023-12-30 DIAGNOSIS — N401 Enlarged prostate with lower urinary tract symptoms: Secondary | ICD-10-CM

## 2023-12-30 DIAGNOSIS — E66811 Obesity, class 1: Secondary | ICD-10-CM | POA: Diagnosis not present

## 2023-12-30 DIAGNOSIS — E785 Hyperlipidemia, unspecified: Secondary | ICD-10-CM

## 2023-12-30 DIAGNOSIS — Z6831 Body mass index (BMI) 31.0-31.9, adult: Secondary | ICD-10-CM | POA: Diagnosis not present

## 2023-12-30 DIAGNOSIS — R3912 Poor urinary stream: Secondary | ICD-10-CM

## 2023-12-30 DIAGNOSIS — M109 Gout, unspecified: Secondary | ICD-10-CM | POA: Diagnosis not present

## 2023-12-30 NOTE — Patient Instructions (Addendum)
-   You can try Magnesium Glycinate 200 mg at bedtime.  - Take daily multivitamin.

## 2023-12-30 NOTE — Assessment & Plan Note (Addendum)
 Intermittent cough.  Use Flonase nasal spray, 2 puff in each nostril daily to reduce postnasal drip. Not needing refill, reports he already has some at home. Take prn Benzonatate  100 mg TID daily prn that is already prescribed to him.  Ensure use of a mask during woodworking to prevent inhalation of wood dust.

## 2023-12-30 NOTE — Assessment & Plan Note (Addendum)
 LDL from 6/23 within normal range of <70, continue Atorvastatin  40 mg, aspirin 81 mg daily.  Orders:   Comp Met (CMET)

## 2023-12-30 NOTE — Assessment & Plan Note (Addendum)
 On Metformin , check B12, folate, magnesium level.  Orders:   B12 and Folate Panel   Magnesium

## 2023-12-30 NOTE — Progress Notes (Signed)
 Established Patient Office Visit   Subjective  Patient ID: Daniel Pinal, male    DOB: 10-11-1955  Age: 68 y.o. MRN: [id number]  Chief Complaint  Patient presents with   Cough   Peripheral Neuropathy   Diabetes   Hyperlipidemia    Discussed the use of AI scribe software for clinical note transcription with the patient, who gave verbal consent to proceed.  History of Present Illness Kia Stavros is a 68 year old male who presents for a follow-up visit.  - BPH with LUTS, family h/o prostate cancer in his father,  ED:  Saw urology PA,  Shannon Mcgowan on 11/23/23. PSA was normal. Recommendation is to continue prn Tadalafil  5 mg for. Would like to get PSA every 6 months. Tamsulosin  0.4 mg daily to be continued. Recommends f/u in 6 months, consider cystoscopy if ongoing symptoms.   - Foot drop, idiopathic peripheral neuropathy: Has seen [facility]/neurology, last visit was on 11/12/23 with Allyson Broody NP. Was referred to Physical therapy on 11/12/23 visit. Refill pentoxifylline  prescription as it helps with walking, especially in mountainous areas. Due to location of his residence he has not yet started Physical therapy.   - Mild cognitive impairment:  Mild cognitive impairment is being managed with memantine  Memantine  10 mg BID.   - DM II with hyperlipidemia: Metformin  500 mg daily, Aspirin 81 mg, Atorvastatin  40 mg daily.   - Insomnia: Melatonin 1 mg at bedtime.   - OSA did not tolerate CPAP. He has considered a mouth device for sleep apnea but has not yet obtained one, and he denies a history of sleep apnea.  - Intermittent cough that started about a week ago, which he attributes to cold air or dust from woodworking. The cough is described as 'aggravating' and productive of thick mucus. He has Tessalon  for the cough but has not used it. He also experiences postnasal drip, especially when lying down, and acknowledges not always wearing a mask during woodworking, which  may contribute to his symptoms.   - He engages in activities to stimulate his brain, such as puzzles and painting by numbers, and stays physically active by walking his dog and doing projects around the house. He is cautious about activities that may increase his risk of falls due to his history of weakness and foot drop. He is no longer taking Lyrica 100 mg.   - Taking Allopurinol  300 mg daily for gout prevention. Uric acid from 07/07/23 was within normal range.     ROS As per HPI    Objective:     BP 110/60 (BP Location: Right Arm, Patient Position: Sitting, Cuff Size: Normal)   Pulse 96   Temp 98.4 F (36.9 C) (Oral)   Ht 5' 10 (1.778 m)   Wt 222 lb 9.6 oz (101 kg)   SpO2 97%   BMI 31.94 kg/m      12/30/2023    2:13 PM 08/28/2023    1:21 PM 07/07/2023    9:34 AM  Depression screen PHQ 2/9  Decreased Interest 2 0 0  Down, Depressed, Hopeless 0 0 2  PHQ - 2 Score 2 0 2  Altered sleeping 0 3 3  Tired, decreased energy 3 3 3   Change in appetite 0 3 2  Feeling bad or failure about yourself  0 0 0  Trouble concentrating 2 1 2   Moving slowly or fidgety/restless 0 2 0  Suicidal thoughts 0 0 0  PHQ-9 Score 7 12  12  Difficult doing work/chores Not difficult at all Not difficult at all Somewhat difficult     Data saved with a previous flowsheet row definition      12/30/2023    2:13 PM 08/28/2023    1:22 PM 07/07/2023    9:34 AM 06/18/2022   10:33 AM  GAD 7 : Generalized Anxiety Score  Nervous, Anxious, on Edge 0 1 0 0  Control/stop worrying 0 0 0 0  Worry too much - different things 0 0 2 2  Trouble relaxing 0 1 3 2   Restless 0 1 2 0  Easily annoyed or irritable 1 1 2 2   Afraid - awful might happen 0 0 0 0  Total GAD 7 Score 1 4 9 6   Anxiety Difficulty Not difficult at all Not difficult at all Not difficult at all       12/30/2023    2:13 PM 08/28/2023    1:21 PM 07/07/2023    9:34 AM  Depression screen PHQ 2/9  Decreased Interest 2 0 0  Down, Depressed,  Hopeless 0 0 2  PHQ - 2 Score 2 0 2  Altered sleeping 0 3 3  Tired, decreased energy 3 3 3   Change in appetite 0 3 2  Feeling bad or failure about yourself  0 0 0  Trouble concentrating 2 1 2   Moving slowly or fidgety/restless 0 2 0  Suicidal thoughts 0 0 0  PHQ-9 Score 7 12  12    Difficult doing work/chores Not difficult at all Not difficult at all Somewhat difficult     Data saved with a previous flowsheet row definition      12/30/2023    2:13 PM 08/28/2023    1:22 PM 07/07/2023    9:34 AM 06/18/2022   10:33 AM  GAD 7 : Generalized Anxiety Score  Nervous, Anxious, on Edge 0 1 0 0  Control/stop worrying 0 0 0 0  Worry too much - different things 0 0 2 2  Trouble relaxing 0 1 3 2   Restless 0 1 2 0  Easily annoyed or irritable 1 1 2 2   Afraid - awful might happen 0 0 0 0  Total GAD 7 Score 1 4 9 6   Anxiety Difficulty Not difficult at all Not difficult at all Not difficult at all    SDOH Screenings   Food Insecurity: No Food Insecurity (12/27/2023)  Housing: Low Risk (12/27/2023)  Transportation Needs: No Transportation Needs (12/27/2023)  Utilities: Not At Risk (06/24/2023)  Alcohol Screen: Low Risk (07/04/2023)  Depression (PHQ2-9): Medium Risk (12/30/2023)  Financial Resource Strain: Medium Risk (12/27/2023)  Physical Activity: Inactive (12/27/2023)  Social Connections: Moderately Integrated (12/27/2023)  Stress: Stress Concern Present (12/27/2023)  Tobacco Use: Low Risk (12/30/2023)  Health Literacy: Adequate Health Literacy (06/24/2023)     Physical Exam Constitutional:      General: He is not in acute distress.    Appearance: Normal appearance.  HENT:     Head: Normocephalic and atraumatic.     Right Ear: Tympanic membrane normal.     Left Ear: Tympanic membrane normal.     Nose: Rhinorrhea present.     Mouth/Throat:     Mouth: Mucous membranes are moist.  Eyes:     Extraocular Movements: Extraocular movements intact.     Pupils: Pupils are equal, round, and  reactive to light.  Cardiovascular:     Rate and Rhythm: Normal rate.  Pulmonary:     Effort: Pulmonary effort is normal.  Breath sounds: Normal breath sounds. No wheezing, rhonchi or rales.  Abdominal:     Tenderness: There is no guarding.  Musculoskeletal:     Cervical back: No rigidity.     Right lower leg: No edema.     Left lower leg: No edema.  Lymphadenopathy:     Cervical: No cervical adenopathy.  Skin:    General: Skin is warm and dry.  Neurological:     Mental Status: He is alert and oriented to person, place, and time.     Motor: No weakness.  Psychiatric:        Mood and Affect: Mood normal.        No results found for any visits on 12/30/23.  The ASCVD Risk score (Arnett DK, et al., 2019) failed to calculate for the following reasons:   The valid total cholesterol range is 130 to 320 mg/dL     Assessment & Plan:  Trial of Magnesium Glycinate 200 mg at bedtime to help with sleep.  Assessment & Plan Type 2 diabetes mellitus with other specified complication, without long-term current use of insulin (HCC) Check A1c, CMP today. Continue Metformin  500 mg daily. Not needing refill. Discussed on diet improvement to improve blood glucose.  Discussed the importance of a balanced diet and multivitamin supplementation. Take a daily multivitamin for age over 57. Encouraged staying active and engaging in brain-stimulating activities. Orders:   HgB A1c   Comp Met (CMET)  Hyperlipidemia associated with type 2 diabetes mellitus (HCC) LDL from 6/23 within normal range of <70, continue Atorvastatin  40 mg, aspirin 81 mg daily.  Orders:   Comp Met (CMET)  Medication management On Metformin , check B12, folate, magnesium level.  Orders:   B12 and Folate Panel   Magnesium  Class 1 obesity with serious comorbidity and body mass index (BMI) of 31.0 to 31.9 in adult, unspecified obesity type Check vitamin D  level. Encouraged continued weight management  efforts. Orders:   Vitamin D  (25 hydroxy)  Benign prostatic hyperplasia with weak urinary stream Stable on current treatment with Tamsulosin  0.4 mg, continue follow up with urology.      Gout of foot, unspecified cause, unspecified chronicity, unspecified laterality Managed with Allopurinol  300 mg daily, continue.      Chronic cough Intermittent cough.  Use Flonase nasal spray, 2 puff in each nostril daily to reduce postnasal drip. Not needing refill, reports he already has some at home. Take prn Benzonatate  100 mg TID daily prn that is already prescribed to him.  Ensure use of a mask during woodworking to prevent inhalation of wood dust.       Return in about 6 months (around 07/07/2024), or After 07/06/24 for annual physical, last PE was on 07/07/23.   Luke Shade, MD

## 2023-12-30 NOTE — Assessment & Plan Note (Addendum)
 Managed with Allopurinol  300 mg daily, continue.

## 2023-12-30 NOTE — Assessment & Plan Note (Addendum)
 Stable on current treatment with Tamsulosin  0.4 mg, continue follow up with urology.

## 2023-12-30 NOTE — Assessment & Plan Note (Addendum)
 Check A1c, CMP today. Continue Metformin  500 mg daily. Not needing refill. Discussed on diet improvement to improve blood glucose.  Discussed the importance of a balanced diet and multivitamin supplementation. Take a daily multivitamin for age over 33. Encouraged staying active and engaging in brain-stimulating activities. Orders:   HgB A1c   Comp Met (CMET)

## 2023-12-31 ENCOUNTER — Ambulatory Visit: Payer: Self-pay

## 2023-12-31 LAB — COMPREHENSIVE METABOLIC PANEL WITH GFR
ALT: 20 U/L (ref 3–53)
AST: 20 U/L (ref 5–37)
Albumin: 4.4 g/dL (ref 3.5–5.2)
Alkaline Phosphatase: 64 U/L (ref 39–117)
BUN: 14 mg/dL (ref 6–23)
CO2: 32 meq/L (ref 19–32)
Calcium: 9.6 mg/dL (ref 8.4–10.5)
Chloride: 103 meq/L (ref 96–112)
Creatinine, Ser: 1.14 mg/dL (ref 0.40–1.50)
GFR: 65.61 mL/min (ref >60.00)
Glucose, Bld: 126 mg/dL — ABNORMAL HIGH (ref 70–99)
Potassium: 4.5 meq/L (ref 3.5–5.1)
Sodium: 142 meq/L (ref 135–145)
Total Bilirubin: 0.8 mg/dL (ref 0.2–1.2)
Total Protein: 6.8 g/dL (ref 6.0–8.3)

## 2023-12-31 LAB — VITAMIN D 25 HYDROXY (VIT D DEFICIENCY, FRACTURES): VITD: 53.64 ng/mL (ref 30.00–100.00)

## 2023-12-31 LAB — HEMOGLOBIN A1C: Hgb A1c MFr Bld: 5.7 % (ref 4.6–6.5)

## 2023-12-31 LAB — B12 AND FOLATE PANEL
Folate: 13.7 ng/mL (ref >5.9)
Vitamin B-12: 509 pg/mL (ref 211–911)

## 2023-12-31 LAB — MAGNESIUM: Magnesium: 2.2 mg/dL (ref 1.5–2.5)

## 2024-01-05 ENCOUNTER — Ambulatory Visit: Admitting: Family Medicine

## 2024-02-03 ENCOUNTER — Other Ambulatory Visit: Payer: Self-pay

## 2024-02-03 DIAGNOSIS — N401 Enlarged prostate with lower urinary tract symptoms: Secondary | ICD-10-CM

## 2024-02-03 MED ORDER — TAMSULOSIN HCL 0.4 MG PO CAPS: 0.4000 mg | ORAL_CAPSULE | Freq: Every day | ORAL | 3 refills | Status: AC

## 2024-06-24 ENCOUNTER — Ambulatory Visit

## 2024-06-30 ENCOUNTER — Ambulatory Visit

## 2024-07-21 ENCOUNTER — Encounter

## 2024-11-10 ENCOUNTER — Other Ambulatory Visit

## 2024-11-18 ENCOUNTER — Ambulatory Visit: Admitting: Urology
